# Patient Record
Sex: Female | Born: 1950
Health system: Southern US, Community
[De-identification: ages and names within clinical notes are randomized; demographics above are authoritative.]

## PROBLEM LIST (undated history)

## (undated) DIAGNOSIS — I10 Essential (primary) hypertension: Secondary | ICD-10-CM

## (undated) DIAGNOSIS — F419 Anxiety disorder, unspecified: Secondary | ICD-10-CM

## (undated) DIAGNOSIS — F329 Major depressive disorder, single episode, unspecified: Secondary | ICD-10-CM

## (undated) DIAGNOSIS — M8580 Other specified disorders of bone density and structure, unspecified site: Secondary | ICD-10-CM

## (undated) DIAGNOSIS — K635 Polyp of colon: Secondary | ICD-10-CM

## (undated) DIAGNOSIS — T8859XA Other complications of anesthesia, initial encounter: Secondary | ICD-10-CM

## (undated) DIAGNOSIS — J449 Chronic obstructive pulmonary disease, unspecified: Secondary | ICD-10-CM

## (undated) DIAGNOSIS — F32A Depression, unspecified: Secondary | ICD-10-CM

## (undated) HISTORY — PX: APPENDECTOMY: SHX54

## (undated) HISTORY — DX: Depression, unspecified: F32.A

## (undated) HISTORY — DX: Polyp of colon: K63.5

## (undated) HISTORY — DX: Essential (primary) hypertension: I10

## (undated) HISTORY — DX: Major depressive disorder, single episode, unspecified: F32.9

## (undated) HISTORY — DX: Other specified disorders of bone density and structure, unspecified site: M85.80

## (undated) HISTORY — DX: Chronic obstructive pulmonary disease, unspecified: J44.9

## (undated) HISTORY — DX: Anxiety disorder, unspecified: F41.9

---

## 1998-11-14 HISTORY — PX: COLONOSCOPY: SHX174

## 2001-05-03 ENCOUNTER — Ambulatory Visit (HOSPITAL_COMMUNITY): Admission: RE | Admit: 2001-05-03 | Discharge: 2001-05-03 | Payer: Self-pay | Admitting: *Deleted

## 2001-05-03 ENCOUNTER — Encounter: Payer: Self-pay | Admitting: *Deleted

## 2001-07-17 ENCOUNTER — Encounter: Payer: Self-pay | Admitting: Family Medicine

## 2001-07-17 ENCOUNTER — Ambulatory Visit (HOSPITAL_COMMUNITY): Admission: RE | Admit: 2001-07-17 | Discharge: 2001-07-17 | Payer: Self-pay | Admitting: Family Medicine

## 2001-07-18 ENCOUNTER — Encounter: Payer: Self-pay | Admitting: Family Medicine

## 2001-07-18 ENCOUNTER — Ambulatory Visit (HOSPITAL_COMMUNITY): Admission: RE | Admit: 2001-07-18 | Discharge: 2001-07-18 | Payer: Self-pay | Admitting: Family Medicine

## 2002-07-26 ENCOUNTER — Encounter: Payer: Self-pay | Admitting: Family Medicine

## 2002-07-26 ENCOUNTER — Ambulatory Visit (HOSPITAL_COMMUNITY): Admission: RE | Admit: 2002-07-26 | Discharge: 2002-07-26 | Payer: Self-pay | Admitting: Family Medicine

## 2003-09-30 ENCOUNTER — Ambulatory Visit (HOSPITAL_COMMUNITY): Admission: RE | Admit: 2003-09-30 | Discharge: 2003-09-30 | Payer: Self-pay | Admitting: Family Medicine

## 2003-10-02 ENCOUNTER — Ambulatory Visit (HOSPITAL_COMMUNITY): Admission: RE | Admit: 2003-10-02 | Discharge: 2003-10-02 | Payer: Self-pay | Admitting: Family Medicine

## 2003-10-20 ENCOUNTER — Observation Stay (HOSPITAL_COMMUNITY): Admission: EM | Admit: 2003-10-20 | Discharge: 2003-10-20 | Payer: Self-pay | Admitting: Emergency Medicine

## 2004-04-14 ENCOUNTER — Ambulatory Visit (HOSPITAL_COMMUNITY): Admission: RE | Admit: 2004-04-14 | Discharge: 2004-04-14 | Payer: Self-pay | Admitting: Family Medicine

## 2005-08-02 ENCOUNTER — Ambulatory Visit (HOSPITAL_COMMUNITY): Admission: RE | Admit: 2005-08-02 | Discharge: 2005-08-02 | Payer: Self-pay | Admitting: Family Medicine

## 2005-10-13 ENCOUNTER — Ambulatory Visit: Payer: Self-pay | Admitting: Internal Medicine

## 2005-11-14 HISTORY — PX: COLONOSCOPY: SHX174

## 2005-11-21 ENCOUNTER — Ambulatory Visit: Payer: Self-pay | Admitting: Internal Medicine

## 2005-11-21 ENCOUNTER — Ambulatory Visit (HOSPITAL_COMMUNITY): Admission: RE | Admit: 2005-11-21 | Discharge: 2005-11-21 | Payer: Self-pay | Admitting: Internal Medicine

## 2006-08-21 ENCOUNTER — Ambulatory Visit (HOSPITAL_COMMUNITY): Admission: RE | Admit: 2006-08-21 | Discharge: 2006-08-21 | Payer: Self-pay | Admitting: Family Medicine

## 2007-02-05 ENCOUNTER — Ambulatory Visit (HOSPITAL_COMMUNITY): Admission: RE | Admit: 2007-02-05 | Discharge: 2007-02-05 | Payer: Self-pay | Admitting: Family Medicine

## 2007-12-21 ENCOUNTER — Ambulatory Visit (HOSPITAL_COMMUNITY): Admission: RE | Admit: 2007-12-21 | Discharge: 2007-12-21 | Payer: Self-pay | Admitting: Family Medicine

## 2008-01-08 ENCOUNTER — Ambulatory Visit (HOSPITAL_COMMUNITY): Admission: RE | Admit: 2008-01-08 | Discharge: 2008-01-08 | Payer: Self-pay | Admitting: Family Medicine

## 2008-10-07 ENCOUNTER — Ambulatory Visit (HOSPITAL_COMMUNITY): Admission: RE | Admit: 2008-10-07 | Discharge: 2008-10-07 | Payer: Self-pay | Admitting: Family Medicine

## 2010-02-23 ENCOUNTER — Ambulatory Visit (HOSPITAL_COMMUNITY): Admission: RE | Admit: 2010-02-23 | Discharge: 2010-02-23 | Payer: Self-pay | Admitting: Family Medicine

## 2010-03-30 ENCOUNTER — Ambulatory Visit (HOSPITAL_COMMUNITY): Admission: RE | Admit: 2010-03-30 | Discharge: 2010-03-30 | Payer: Self-pay | Admitting: Family Medicine

## 2010-12-04 ENCOUNTER — Encounter: Payer: Self-pay | Admitting: Family Medicine

## 2010-12-05 ENCOUNTER — Encounter: Payer: Self-pay | Admitting: Family Medicine

## 2011-02-01 ENCOUNTER — Encounter: Payer: Self-pay | Admitting: Gastroenterology

## 2011-02-10 NOTE — Letter (Signed)
Summary: Colonoscopy Letter  [facility]  [address] [place], [place] [address]   Phone: [phone]  Fax: [phone]      February 01, 2011 MRN: [id number]   EMONEE WINKOWSKI [address] [place], [place]  [address]   Dear Ms. FUNCHES,   According to your medical record, it is time for you to schedule a Colonoscopy. The American Cancer Society recommends this procedure as a method to detect early colon cancer. Patients with a family history of colon cancer, or a personal history of colon polyps or inflammatory bowel disease are at increased risk.  This letter has been generated based on the recommendations made at the time of your procedure. If you feel that in your particular situation this may no longer apply, please contact our office.  Please call our office at [phone] to schedule this appointment or to update your records at your earliest convenience.  Thank you for cooperating with [place] to provide you with the very best care possible.   Sincerely,  Judie Petit T. Russella Dar, M.D.  [facility] [phone]

## 2011-03-11 ENCOUNTER — Other Ambulatory Visit (HOSPITAL_COMMUNITY): Payer: Self-pay | Admitting: Family Medicine

## 2011-03-11 DIAGNOSIS — Z139 Encounter for screening, unspecified: Secondary | ICD-10-CM

## 2011-03-15 ENCOUNTER — Ambulatory Visit (HOSPITAL_COMMUNITY)
Admission: RE | Admit: 2011-03-15 | Discharge: 2011-03-15 | Disposition: A | Discharge status: Home / Self Care | Payer: 59 | Source: Ambulatory Visit | Attending: Family Medicine | Admitting: Family Medicine

## 2011-03-15 DIAGNOSIS — Z1231 Encounter for screening mammogram for malignant neoplasm of breast: Secondary | ICD-10-CM | POA: Insufficient documentation

## 2011-03-15 DIAGNOSIS — Z139 Encounter for screening, unspecified: Secondary | ICD-10-CM

## 2011-03-28 ENCOUNTER — Encounter: Payer: Self-pay | Admitting: Internal Medicine

## 2011-04-01 NOTE — Op Note (Signed)
NAME:  Brittany Miranda, Brittany Miranda                          ACCOUNT NO.:  [id number]   MEDICAL RECORD NO.:  [id number]                   PATIENT TYPE:  INP   LOCATION:  A304                                 FACILITY:  [facility]   PHYSICIAN:  Dalia Heading, M.D.               DATE OF BIRTH:  1951/09/17   DATE OF PROCEDURE:  10/19/2003  DATE OF DISCHARGE:                                 OPERATIVE REPORT   PREOPERATIVE DIAGNOSIS:  Acute appendicitis.   POSTOPERATIVE DIAGNOSIS:  Acute appendicitis.   PROCEDURE:  Laparoscopic appendectomy.   SURGEON:  Dalia Heading, M.D.   ANESTHESIA:  General endotracheal.   INDICATIONS:  The patient is a 60 year old white female who presented to the  emergency room with worsening periumbilical and right lower quadrant  abdominal pain.  CT scan of the abdomen and pelvis was performed which  revealed acute appendicitis.  The patient now comes to the operating room  for laparoscopic appendectomy.  The risks and benefits of the procedure  including bleeding, infection, and the possibility of an open procedure were  fully explained to the patient, who gave informed consent.   DESCRIPTION OF PROCEDURE:  The patient was placed in the supine position.  After induction of general endotracheal anesthesia, the abdomen was prepped  and draped using the usual sterile technique with Betadine.  Surgical site  confirmation was performed.   While in Trendelenburg position, a supraumbilical incision was made.  A  Veress needle was then introduced into the abdominal cavity and confirmation  of placement was done using the saline drop test.  The abdomen was then  insufflated to 16 mmHg pressure.  An 11-mm trocar was introduced into the  abdominal cavity under direct visualization without difficulty.  An  additional 12-mm trocar was placed in the suprapubic region and 5-mm trocars  were placed in the left lower quadrant region.  The appendix was visualized  and noted to be acutely  inflamed.  There was no evidence of perforation.   The mesoappendix was divided using the harmonic scalpel.  A vascular Endo-  GIA was placed across the base of the appendix and fired.  The appendix was  removed using EndoCatch back without difficulty.  The staple line was  inspected and noted to be within normal limits. All air was then evacuated  from the abdominal cavity prior to removal of the trocars.   All wounds were irrigated with normal saline.  All wounds were injected with  0.25% Sensorcaine.  The supraumbilical fascia as well as suprapubic fascia  were reapproximated using an #0 Vicryl interrupted suture. All skin  incisions were closed using staples.  Betadine ointment and dry sterile  dressing were applied. All tape and needle counts were correct at the end of  the procedure.  The patient was extubated in the operating room and went  back to recovery room awake and stable condition.  COMPLICATIONS:  None.   SPECIMENS:  Appendix.   BLOOD LOSS:  Minimal.      ___________________________________________                                            Dalia Heading, M.D.   MAJ/MEDQ  D:  10/20/2003  T:  10/20/2003  Job:  [id number]

## 2011-04-01 NOTE — Consult Note (Signed)
NAME:  Brittany Miranda, Brittany Miranda                ACCOUNT NO.:  [id number]   MEDICAL RECORD NO.:  [id number]          PATIENT TYPE:  AMB   LOCATION:  DAY                           FACILITY:  [facility]   PHYSICIAN:  R. Roetta Sessions, M.D. DATE OF BIRTH:  [birth year]   DATE OF CONSULTATION:  10/13/2005  DATE OF DISCHARGE:                                   CONSULTATION   REASON FOR CONSULTATION:  Three Hemoccult positive stools.   HISTORY OF PRESENT ILLNESS:  This patient is a pleasant, 60 year old  Caucasian female who was sent over at the request of Dr. Regino Schultze and  associates to further evaluate Hemoccult positive stool.  Brittany Miranda denies  any fresh blood per rectum or melena.  She went for her routine pelvic/GYN  exam and had three cards to come back positive.  She has not had any  abdominal pain or upper GI tract symptoms such as odynophagia, dysphagia,  early satiety, reflux symptoms, or nausea or vomiting.  She has not lost any  weight.  She takes Fosamax for osteoporosis.   Her past GI history is significant for ongoing colonoscopy some 6 years ago  down with the [facility].  She was said to have had some small polyps  removed and was due to for a colonoscopy 5 years later. She was Hemoccult  negative one year ago.  There is no family history of colorectal neoplasia.   PAST MEDICAL HISTORY:  Significant for osteoporosis, history of colonic  polyps.   PAST SURGERY:  Status post appendectomy 1 year ago.   CURRENT MEDICATIONS:  1.  Fosamax once week.  2.  Prozac 20 mg daily.  3.  Prempro daily.  4.  Multivitamin daily.  5.  Fibercon 2 daily.  6.  Calcium 500 mg with vitamin D once daily.  7.  Ibuprofen p.r.n.   ALLERGIES:  No known drug allergies.   FAMILY HISTORY:  Mother and dad with renal failure at age 46.  No history of  chronic GI or liver illness.   SOCIAL HISTORY:  The patient is married and has one child.  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  She stopped smoking 10  years ago.  She rarely  consumes alcohol.   REVIEW OF SYSTEMS:  No chest pain, no dyspnea on exertion, no fever or  chills, no change in weight.   PHYSICAL EXAMINATION:  GENERAL:  Reveals a pleasant, 61 year old lady  resting comfortably.  VITAL SIGNS:  Weight 111.5, height 5 feet 5 inches.  Temperature 98, BP  108/76, pulse of 68.  SKIN:  Warm and dry.  No jaundice.  No cutaneous stigmata of chronic liver  disease.  HEENT:  No scleral icterus.  NECK:  JVD is not prominent.  CHEST:  Lungs are clear to auscultation.  BREASTS:  Exam is deferred.  ABDOMEN:  Nondistended, positive bowel sounds.  Soft, nontender, without  appreciable mass or organomegaly.  EXTREMITIES:  No edema.   ADMITTING IMPRESSION:  [name] is a pleasant, 60 year old lady  found to be Hemoccult positive on three returnable cards.  It is good to know  that she had a colonoscopy 6 years ago, but she did have some polyps removed  at that time.  She needs to have another colonoscopy.  I have discussed this  approach with Brittany Miranda, the potential risks, benefits, and alternatives  have been reviewed, questions answered.  She would like to have an exam just  after Christmas once she returns from the [place].  We will make further  recommendations once the colonoscopy has been performed.   I would like to thank Dr. Karleen Hampshire and Margarita Mail, PA-C for allowing  me to see this nice lady today.      Jonathon Bellows, M.D.  Electronically Signed     RMR/MEDQ  D:  10/13/2005  T:  10/13/2005  Job:  [id number]   cc:   Kirk Ruths, M.D.  Fax: [phone]

## 2011-04-01 NOTE — Op Note (Signed)
NAME:  Brittany Miranda, Brittany Miranda                ACCOUNT NO.:  [id number]   MEDICAL RECORD NO.:  [id number]          PATIENT TYPE:  AMB   LOCATION:  DAY                           FACILITY:  [facility]   PHYSICIAN:  R. Roetta Sessions, M.D. DATE OF BIRTH:  01/06/1951   DATE OF PROCEDURE:  11/21/2005  DATE OF DISCHARGE:                                 OPERATIVE REPORT   PROCEDURE:  Diagnostic colonoscopy.   INDICATIONS FOR PROCEDURE:  The patient is a 60 year old lady found to be  Hemoccult positive on three returnable cards. She had a colonoscopy six  years ago with some polyps removed. Clinically, she is not having any GI  symptoms. No bleeding, etc. Colonoscopy is now being done. This approach has  been discussed with the patient at length. Potential risks, benefits, and  alternatives have been reviewed and questions answered. She is agreeable.  Please see documentation in the medical record.   PROCEDURE NOTE:  O2 saturation, blood pressure, pulse, and respirations were  monitored throughout the entire procedure. Conscious sedation with Versed 4  mg IV and Demerol 75 mg IV in divided doses.   INSTRUMENT:  Olympus video chip system.   FINDINGS:  Digital rectal exam revealed no abnormalities.   ENDOSCOPIC FINDINGS:  Prep was good.   Rectum:  Examination of the rectal mucosa including retroflexed view of the  anal verge revealed no abnormalities.   Colon:  Colonic mucosa was surveyed from the rectosigmoid junction through  the left, transverse, and right colon to the area of the appendiceal  orifice, ileocecal valve, and cecum. These structures were well seen and  photographed for the record. From this level, the scope was slowly  withdrawn, and all previously mentioned mucosal surfaces were again seen.  The patient had sigmoid diverticula. The remainder of the colonic mucosa  appeared normal. The patient tolerated the procedure well and was reactive  to endoscopy.   IMPRESSION:  1.  Normal  rectum.  2.  Sigmoid diverticula. The remainder of colonic mucosa appeared normal.   RECOMMENDATIONS:  The patient has no GI symptoms. She does take Fosamax and  ibuprofen on a regular basis. Etiology of Hemoccult positive stool not  defined based on today's examination.   RECOMMENDATIONS:  We will go ahead and do a CBC and see where we stand.  Certainly, if she has clinical evidence of ongoing bleeding, a further GI  evaluation will be warranted.      Jonathon Bellows, M.D.  Electronically Signed     RMR/MEDQ  D:  11/21/2005  T:  11/21/2005  Job:  [id number]   cc:   Kirk Ruths, M.D.  Fax: [phone]

## 2011-04-05 ENCOUNTER — Encounter: Payer: Self-pay | Admitting: Gastroenterology

## 2011-04-05 ENCOUNTER — Ambulatory Visit (INDEPENDENT_AMBULATORY_CARE_PROVIDER_SITE_OTHER): Payer: 59 | Admitting: Gastroenterology

## 2011-04-05 VITALS — BP 143/93 | HR 76 | Temp 97.3°F | Ht 64.0 in | Wt 109.8 lb

## 2011-04-05 DIAGNOSIS — K635 Polyp of colon: Secondary | ICD-10-CM | POA: Insufficient documentation

## 2011-04-05 DIAGNOSIS — D126 Benign neoplasm of colon, unspecified: Secondary | ICD-10-CM

## 2011-04-05 NOTE — Progress Notes (Signed)
Primary Care Physician:  Colette Ribas, MD  Primary Gastroenterologist:  Roetta Sessions MD  Chief Complaint  Patient presents with  . Colonoscopy    colon polyps    HPI:  Brittany Miranda is a 60 y.o. female here to schedule surveillance colonoscopy. She has a history of colon polyps and states she was told she is after followups. Polyps were found at time of colonoscopy in 2000 by the [facility] in [place]. Her last colonoscopy was in 2007 by Dr. Jena Gauss. She had sigmoid diverticula but no polyps at that time. Denies any constipation as long she takes her fiber. She also eats high fiber diet. Denies any blood in the stool or melena. No abdominal pain, anorexia, vomiting, weight loss, heartburn.  Current Outpatient Prescriptions  Medication Sig Dispense Refill  . Alendronate Sodium (FOSAMAX PO) Take by mouth.        . ALPRAZolam (XANAX) 0.25 MG tablet Take 0.25 mg by mouth at bedtime as needed.        . calcium-vitamin D 250-100 MG-UNIT per tablet Take 1 tablet by mouth 2 (two) times daily.        Marland Kitchen doxycycline (PERIOSTAT) 20 MG tablet 2 (two) times daily.       Marland Kitchen FLUoxetine (PROZAC) 20 MG capsule Take 20 mg by mouth daily.        Marland Kitchen ibuprofen (ADVIL,MOTRIN) 200 MG tablet Take 200 mg by mouth every 6 (six) hours as needed.        . Multiple Vitamin (MULTIVITAMIN) capsule Take 1 capsule by mouth daily.        . polycarbophil (FIBERCON) 625 MG tablet Take 625 mg by mouth 2 (two) times daily.          Allergies as of 04/05/2011  . (No Known Allergies)    Past Medical History  Diagnosis Date  . Osteopenia   . Colon polyps   . Anxiety   . Depression     Past Surgical History  Procedure Date  . Appendectomy   . Colonoscopy 11/2005    Dr. Claudius Sis diverticula  . Colonoscopy 2000    [facility]-->per patient colon polyps, adenomatous    Family History  Problem Relation Age of Onset  . Kidney disease Mother   . Kidney disease Father   . Liver disease Neg Hx   .  Colon polyps Neg Hx     History   Social History  . Marital Status: Divorced    Spouse Name: N/A    Number of Children: 1  . Years of Education: N/A   Occupational History  . retired     Agricultural consultant Music therapist) at [employer]   Social History Main Topics  . Smoking status: Former Smoker    Quit date: 04/04/1998  . Smokeless tobacco: Not on file  . Alcohol Use: No     socially  . Drug Use: No  . Sexually Active: Not on file     ROS:  General: Negative for anorexia, weight loss, fever, chills, fatigue, weakness. Eyes: Negative for vision changes.  ENT: Negative for hoarseness, difficulty swallowing , nasal congestion. CV: Negative for chest pain, angina, palpitations, dyspnea on exertion, peripheral edema.  Respiratory: Negative for dyspnea at rest, dyspnea on exertion, cough, sputum, wheezing.  GI: See history of present illness. GU:  Negative for dysuria, hematuria, urinary incontinence, urinary frequency, nocturnal urination.  MS: Negative for joint pain, low back pain.  Derm: Negative for rash or itching.  Neuro: Negative for weakness, abnormal  sensation, seizure, frequent headaches, memory loss, confusion.  Psych: Negative for anxiety, depression, suicidal ideation, hallucinations.  Endo: Negative for unusual weight change.  Heme: Negative for bruising or bleeding. Allergy: Negative for rash or hives.    Physical Examination:  BP 143/93  Pulse 76  Temp(Src) 97.3 F (36.3 C) (Temporal)  Ht 5\' 4"  (1.626 m)  Wt 109 lb 12.8 oz (49.805 kg)  BMI 18.85 kg/m2   General: Well-nourished, well-developed in no acute distress.  Head: Normocephalic, atraumatic.   Eyes: Conjunctiva pink, no icterus. Mouth: Oropharyngeal mucosa moist and pink , no lesions erythema or exudate. Neck: Supple without thyromegaly, masses, or lymphadenopathy.  Lungs: Clear to auscultation bilaterally.  Heart: Regular rate and rhythm, no murmurs rubs or gallops.  Abdomen: Bowel sounds are normal,  nontender, nondistended, no hepatosplenomegaly or masses, no abdominal bruits or    hernia , no rebound or guarding.   Extremities: No lower extremity edema.  Neuro: Alert and oriented x 4 , grossly normal neurologically.  Skin: Warm and dry, no rash or jaundice.   Psych: Alert and cooperative, normal mood and affect.

## 2011-04-05 NOTE — Assessment & Plan Note (Signed)
History of colonic polyps back in 2000. No polyp seen on colonoscopy in 2007. Proceed with followup colonoscopy at this time. I have discussed the risks, alternatives, benefits with regards to but not limited to the risk of reaction to medication, bleeding, infection, perforation and the patient is agreeable to proceed. Written consent to be obtained.

## 2011-04-05 NOTE — Progress Notes (Signed)
Cc to PCP 

## 2011-04-07 ENCOUNTER — Ambulatory Visit: Payer: 59 | Admitting: Urgent Care

## 2011-04-12 ENCOUNTER — Ambulatory Visit (HOSPITAL_COMMUNITY)
Admission: RE | Admit: 2011-04-12 | Discharge: 2011-04-12 | Disposition: A | Discharge status: Home / Self Care | Payer: 59 | Source: Ambulatory Visit | Attending: Internal Medicine | Admitting: Internal Medicine

## 2011-04-12 ENCOUNTER — Encounter: Payer: 59 | Admitting: Internal Medicine

## 2011-04-12 DIAGNOSIS — Z09 Encounter for follow-up examination after completed treatment for conditions other than malignant neoplasm: Secondary | ICD-10-CM | POA: Insufficient documentation

## 2011-04-12 DIAGNOSIS — K5730 Diverticulosis of large intestine without perforation or abscess without bleeding: Secondary | ICD-10-CM

## 2011-04-12 DIAGNOSIS — Z860100 Personal history of colon polyps, unspecified: Secondary | ICD-10-CM | POA: Insufficient documentation

## 2011-05-30 NOTE — Op Note (Signed)
  NAME:  Brittany Miranda, Brittany Miranda                ACCOUNT NO.:  [id number]  MEDICAL RECORD NO.:  [id number]           PATIENT TYPE:  O  LOCATION:  DAYP                          FACILITY:  [facility]  PHYSICIAN:  R. Roetta Sessions, M.D. DATE OF BIRTH:  07/08/1951  DATE OF PROCEDURE:  04/12/2011 DATE OF DISCHARGE:                              OPERATIVE REPORT   PROCEDURE:  Surveillance colonoscopy.  SURGEON:  R. Roetta Sessions, MD  INDICATIONS FOR PROCEDURE:  This is a 60 year old lady with history of colonic adenomas removed in the distant past (at [facility]).  Last colonoscopy in 2007 here demonstrated no polyps.  She has no lower GI tract symptoms currently.  Colonoscopy is now being done in a surveillance maneuver.  Risks, benefits, limitations, alternatives, and imponderables have been discussed, questions answered.  Please see the documentation in the medical record.  PROCEDURE NOTE:  O2 saturation, blood pressure, pulse, and respirations were monitored throughout the entire procedure.  CONSCIOUS SEDATION:  Versed 8 mg IV and Demerol 100 mg IV in divided doses.  INSTRUMENT:  Pentax video chip system.  FINDINGS:  Digital rectal exam revealed no abnormalities.  Endoscopic findings:  Prep was good.  Colon:  Colonic mucosa was surveyed from the rectosigmoid junction through the left transverse, right colon to the appendiceal orifice, ileocecal valve/cecum.  These structures were well seen and photographed for record.  From this level, the scope was slowly and cautiously withdrawn.  All previous mentioned mucosal surfaces were again seen. The patient was noted to have a left-sided transverse diverticulum, however, the remainder of the colonic mucosa appeared normal.  Scope was pulled down the rectum.  A thorough examination of the rectal mucosa including retroflexed view of the anal verge demonstrated no abnormalities.  The patient tolerated the procedure well.  Cecal withdrawal time 7  minutes.  IMPRESSION: 1. Normal rectum. 2. Left-sided transverse diverticulum and colonic mucosa appeared     normal.  RECOMMENDATIONS: 1. Diverticulosis literature provided to Ms. Franson. 2. Repeat colonoscopy in 5 years.     Jonathon Bellows, M.D.     RMR/MEDQ  D:  04/12/2011  T:  04/12/2011  Job:  [id number]  cc:   Corrie Mckusick, M.D. Fax: [phone]  Electronically Signed by Lorrin Goodell M.D. on 05/30/2011 08:35:13 AM

## 2011-08-24 ENCOUNTER — Other Ambulatory Visit (HOSPITAL_COMMUNITY): Payer: Self-pay | Admitting: Family Medicine

## 2011-08-24 DIAGNOSIS — Z139 Encounter for screening, unspecified: Secondary | ICD-10-CM

## 2012-04-02 ENCOUNTER — Ambulatory Visit (HOSPITAL_COMMUNITY)
Admission: RE | Admit: 2012-04-02 | Discharge: 2012-04-02 | Disposition: A | Discharge status: Home / Self Care | Payer: BC Managed Care – PPO | Source: Ambulatory Visit | Attending: Family Medicine | Admitting: Family Medicine

## 2012-04-02 DIAGNOSIS — Z13820 Encounter for screening for osteoporosis: Secondary | ICD-10-CM | POA: Insufficient documentation

## 2012-04-02 DIAGNOSIS — Z139 Encounter for screening, unspecified: Secondary | ICD-10-CM

## 2012-04-02 DIAGNOSIS — M899 Disorder of bone, unspecified: Secondary | ICD-10-CM | POA: Insufficient documentation

## 2012-04-02 DIAGNOSIS — M949 Disorder of cartilage, unspecified: Secondary | ICD-10-CM | POA: Insufficient documentation

## 2012-04-05 ENCOUNTER — Other Ambulatory Visit (HOSPITAL_COMMUNITY): Payer: Self-pay | Admitting: Family Medicine

## 2012-04-05 DIAGNOSIS — IMO0001 Reserved for inherently not codable concepts without codable children: Secondary | ICD-10-CM

## 2012-04-13 ENCOUNTER — Ambulatory Visit (HOSPITAL_COMMUNITY)
Admission: RE | Admit: 2012-04-13 | Discharge: 2012-04-13 | Disposition: A | Discharge status: Home / Self Care | Payer: BC Managed Care – PPO | Source: Ambulatory Visit | Attending: Family Medicine | Admitting: Family Medicine

## 2012-04-13 DIAGNOSIS — Z1231 Encounter for screening mammogram for malignant neoplasm of breast: Secondary | ICD-10-CM | POA: Insufficient documentation

## 2012-04-13 DIAGNOSIS — IMO0001 Reserved for inherently not codable concepts without codable children: Secondary | ICD-10-CM

## 2012-10-18 ENCOUNTER — Other Ambulatory Visit (HOSPITAL_COMMUNITY): Payer: Self-pay | Admitting: Family Medicine

## 2012-10-18 ENCOUNTER — Ambulatory Visit (HOSPITAL_COMMUNITY)
Admission: RE | Admit: 2012-10-18 | Discharge: 2012-10-18 | Disposition: A | Discharge status: Home / Self Care | Payer: BC Managed Care – PPO | Source: Ambulatory Visit | Attending: Family Medicine | Admitting: Family Medicine

## 2012-10-18 DIAGNOSIS — R059 Cough, unspecified: Secondary | ICD-10-CM

## 2012-10-18 DIAGNOSIS — R0989 Other specified symptoms and signs involving the circulatory and respiratory systems: Secondary | ICD-10-CM | POA: Insufficient documentation

## 2012-10-18 DIAGNOSIS — R05 Cough: Secondary | ICD-10-CM

## 2012-10-18 DIAGNOSIS — R918 Other nonspecific abnormal finding of lung field: Secondary | ICD-10-CM | POA: Insufficient documentation

## 2012-10-18 DIAGNOSIS — J4489 Other specified chronic obstructive pulmonary disease: Secondary | ICD-10-CM | POA: Insufficient documentation

## 2012-10-18 DIAGNOSIS — J449 Chronic obstructive pulmonary disease, unspecified: Secondary | ICD-10-CM | POA: Insufficient documentation

## 2012-10-19 ENCOUNTER — Other Ambulatory Visit (HOSPITAL_COMMUNITY): Payer: Self-pay | Admitting: Internal Medicine

## 2012-10-19 ENCOUNTER — Ambulatory Visit (HOSPITAL_COMMUNITY)
Admission: RE | Admit: 2012-10-19 | Discharge: 2012-10-19 | Disposition: A | Discharge status: Home / Self Care | Payer: BC Managed Care – PPO | Source: Ambulatory Visit | Attending: Internal Medicine | Admitting: Internal Medicine

## 2012-10-19 DIAGNOSIS — R9389 Abnormal findings on diagnostic imaging of other specified body structures: Secondary | ICD-10-CM

## 2012-10-19 DIAGNOSIS — R918 Other nonspecific abnormal finding of lung field: Secondary | ICD-10-CM | POA: Insufficient documentation

## 2013-05-31 ENCOUNTER — Other Ambulatory Visit (HOSPITAL_COMMUNITY): Payer: Self-pay | Admitting: Family Medicine

## 2013-05-31 DIAGNOSIS — Z139 Encounter for screening, unspecified: Secondary | ICD-10-CM

## 2013-06-06 ENCOUNTER — Ambulatory Visit (HOSPITAL_COMMUNITY)
Admission: RE | Admit: 2013-06-06 | Discharge: 2013-06-06 | Disposition: A | Discharge status: Home / Self Care | Payer: BC Managed Care – PPO | Source: Ambulatory Visit | Attending: Family Medicine | Admitting: Family Medicine

## 2013-06-06 DIAGNOSIS — Z1231 Encounter for screening mammogram for malignant neoplasm of breast: Secondary | ICD-10-CM | POA: Insufficient documentation

## 2013-06-06 DIAGNOSIS — Z139 Encounter for screening, unspecified: Secondary | ICD-10-CM

## 2014-06-09 ENCOUNTER — Other Ambulatory Visit (HOSPITAL_COMMUNITY): Payer: Self-pay | Admitting: Family Medicine

## 2014-06-09 DIAGNOSIS — Z1231 Encounter for screening mammogram for malignant neoplasm of breast: Secondary | ICD-10-CM

## 2014-06-11 ENCOUNTER — Encounter: Payer: Self-pay | Admitting: Gastroenterology

## 2014-06-11 ENCOUNTER — Ambulatory Visit (HOSPITAL_COMMUNITY)
Admission: RE | Admit: 2014-06-11 | Discharge: 2014-06-11 | Disposition: A | Discharge status: Home / Self Care | Payer: BC Managed Care – PPO | Source: Ambulatory Visit | Attending: Family Medicine | Admitting: Family Medicine

## 2014-06-11 DIAGNOSIS — Z1231 Encounter for screening mammogram for malignant neoplasm of breast: Secondary | ICD-10-CM

## 2015-05-14 ENCOUNTER — Other Ambulatory Visit (HOSPITAL_COMMUNITY): Payer: Self-pay | Admitting: Family Medicine

## 2015-05-14 DIAGNOSIS — Z1389 Encounter for screening for other disorder: Secondary | ICD-10-CM

## 2015-05-20 ENCOUNTER — Other Ambulatory Visit (HOSPITAL_COMMUNITY): Payer: Self-pay

## 2015-05-21 ENCOUNTER — Ambulatory Visit (HOSPITAL_COMMUNITY)
Admission: RE | Admit: 2015-05-21 | Discharge: 2015-05-21 | Disposition: A | Discharge status: Home / Self Care | Payer: Medicare Other | Source: Ambulatory Visit | Attending: Family Medicine | Admitting: Family Medicine

## 2015-05-21 DIAGNOSIS — Z1389 Encounter for screening for other disorder: Secondary | ICD-10-CM

## 2015-05-21 DIAGNOSIS — M8580 Other specified disorders of bone density and structure, unspecified site: Secondary | ICD-10-CM | POA: Insufficient documentation

## 2015-05-21 DIAGNOSIS — Z780 Asymptomatic menopausal state: Secondary | ICD-10-CM | POA: Diagnosis not present

## 2015-06-26 ENCOUNTER — Other Ambulatory Visit (HOSPITAL_COMMUNITY): Payer: Self-pay | Admitting: Family Medicine

## 2015-06-26 DIAGNOSIS — Z1231 Encounter for screening mammogram for malignant neoplasm of breast: Secondary | ICD-10-CM

## 2015-07-03 ENCOUNTER — Ambulatory Visit (HOSPITAL_COMMUNITY)
Admission: RE | Admit: 2015-07-03 | Discharge: 2015-07-03 | Disposition: A | Discharge status: Home / Self Care | Payer: Medicare Other | Source: Ambulatory Visit | Attending: Family Medicine | Admitting: Family Medicine

## 2015-07-03 DIAGNOSIS — Z1231 Encounter for screening mammogram for malignant neoplasm of breast: Secondary | ICD-10-CM | POA: Insufficient documentation

## 2016-01-14 DIAGNOSIS — Z1389 Encounter for screening for other disorder: Secondary | ICD-10-CM | POA: Diagnosis not present

## 2016-01-14 DIAGNOSIS — R05 Cough: Secondary | ICD-10-CM | POA: Diagnosis not present

## 2016-01-14 DIAGNOSIS — Z681 Body mass index (BMI) 19 or less, adult: Secondary | ICD-10-CM | POA: Diagnosis not present

## 2016-01-14 DIAGNOSIS — J449 Chronic obstructive pulmonary disease, unspecified: Secondary | ICD-10-CM | POA: Diagnosis not present

## 2016-01-21 ENCOUNTER — Other Ambulatory Visit (HOSPITAL_COMMUNITY): Payer: Self-pay | Admitting: Family Medicine

## 2016-01-21 ENCOUNTER — Ambulatory Visit (HOSPITAL_COMMUNITY)
Admission: RE | Admit: 2016-01-21 | Discharge: 2016-01-21 | Disposition: A | Discharge status: Home / Self Care | Payer: Medicare HMO | Source: Ambulatory Visit | Attending: Family Medicine | Admitting: Family Medicine

## 2016-01-21 DIAGNOSIS — J209 Acute bronchitis, unspecified: Secondary | ICD-10-CM

## 2016-01-21 DIAGNOSIS — J45909 Unspecified asthma, uncomplicated: Secondary | ICD-10-CM | POA: Insufficient documentation

## 2016-01-21 DIAGNOSIS — J449 Chronic obstructive pulmonary disease, unspecified: Secondary | ICD-10-CM | POA: Diagnosis not present

## 2016-01-21 DIAGNOSIS — Z681 Body mass index (BMI) 19 or less, adult: Secondary | ICD-10-CM | POA: Diagnosis not present

## 2016-01-21 DIAGNOSIS — Z1389 Encounter for screening for other disorder: Secondary | ICD-10-CM | POA: Diagnosis not present

## 2016-01-21 DIAGNOSIS — R05 Cough: Secondary | ICD-10-CM | POA: Diagnosis not present

## 2016-01-21 DIAGNOSIS — R69 Illness, unspecified: Secondary | ICD-10-CM | POA: Diagnosis not present

## 2016-02-25 DIAGNOSIS — Z681 Body mass index (BMI) 19 or less, adult: Secondary | ICD-10-CM | POA: Diagnosis not present

## 2016-02-25 DIAGNOSIS — Z1389 Encounter for screening for other disorder: Secondary | ICD-10-CM | POA: Diagnosis not present

## 2016-02-25 DIAGNOSIS — M7521 Bicipital tendinitis, right shoulder: Secondary | ICD-10-CM | POA: Diagnosis not present

## 2016-03-31 DIAGNOSIS — M25511 Pain in right shoulder: Secondary | ICD-10-CM | POA: Diagnosis not present

## 2016-03-31 DIAGNOSIS — Z681 Body mass index (BMI) 19 or less, adult: Secondary | ICD-10-CM | POA: Diagnosis not present

## 2016-03-31 DIAGNOSIS — Z1389 Encounter for screening for other disorder: Secondary | ICD-10-CM | POA: Diagnosis not present

## 2016-04-12 DIAGNOSIS — M19011 Primary osteoarthritis, right shoulder: Secondary | ICD-10-CM | POA: Diagnosis not present

## 2016-05-26 DIAGNOSIS — Z681 Body mass index (BMI) 19 or less, adult: Secondary | ICD-10-CM | POA: Diagnosis not present

## 2016-05-26 DIAGNOSIS — E559 Vitamin D deficiency, unspecified: Secondary | ICD-10-CM | POA: Diagnosis not present

## 2016-05-26 DIAGNOSIS — Z1389 Encounter for screening for other disorder: Secondary | ICD-10-CM | POA: Diagnosis not present

## 2016-05-26 DIAGNOSIS — R69 Illness, unspecified: Secondary | ICD-10-CM | POA: Diagnosis not present

## 2016-05-26 DIAGNOSIS — Z0000 Encounter for general adult medical examination without abnormal findings: Secondary | ICD-10-CM | POA: Diagnosis not present

## 2016-06-22 ENCOUNTER — Ambulatory Visit (INDEPENDENT_AMBULATORY_CARE_PROVIDER_SITE_OTHER): Payer: Medicare HMO | Admitting: Gastroenterology

## 2016-06-22 ENCOUNTER — Other Ambulatory Visit: Payer: Self-pay

## 2016-06-22 ENCOUNTER — Encounter: Payer: Self-pay | Admitting: Gastroenterology

## 2016-06-22 VITALS — BP 139/90 | HR 78 | Temp 97.0°F | Ht 64.0 in | Wt 101.0 lb

## 2016-06-22 DIAGNOSIS — K635 Polyp of colon: Secondary | ICD-10-CM

## 2016-06-22 DIAGNOSIS — Z86010 Personal history of colonic polyps: Secondary | ICD-10-CM

## 2016-06-22 NOTE — Progress Notes (Signed)
Primary Care Physician:  Purvis Kilts, MD Primary Gastroenterologist:  Dr. Gala Romney   Chief Complaint  Patient presents with  . Colonoscopy    5 year    HPI:   Brittany Miranda is a 65 y.o. female presenting today to schedule surveillance colonoscopy due to history of colonic adenomas in the remote past. Her last colonoscopy in 2012 by Dr. Gala Romney was without polyps.   She has no concerning lower or upper GI symptoms. No abdominal pain, N/V, rectal bleeding, changes in appetite, weight loss, dysphagia. 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     Past Medical History:  Diagnosis Date  . Anxiety   . Colon polyps    adenomas in remote past   . COPD (chronic obstructive pulmonary disease) ([place])   . Depression   . Osteopenia     Past Surgical History:  Procedure Laterality Date  . APPENDECTOMY    . COLONOSCOPY  11/2005   Dr. Ala Bent diverticula  . COLONOSCOPY  2000   [facility]-->per patient colon polyps, adenomatous    Current Outpatient Prescriptions  Medication Sig Dispense Refill  . ALPRAZolam (XANAX) 0.25 MG tablet Take 0.5 mg by mouth 3 (three) times daily as needed.     . Calcium Carbonate-Vitamin D (CALTRATE 600+D PO) Take 600 mg by mouth daily.    . diphenhydrAMINE (BENADRYL) 25 mg capsule Take 25 mg by mouth at bedtime as needed.    Marland Kitchen FLUoxetine (PROZAC) 20 MG capsule Take 20 mg by mouth daily.      Marland Kitchen ibuprofen (ADVIL,MOTRIN) 200 MG tablet Take 200 mg by mouth every 6 (six) hours as needed.      . Multiple Vitamin (MULTIVITAMIN) capsule Take 1 capsule by mouth daily.      . Omega-3 Fatty Acids (FISH OIL) 1000 MG CAPS Take 1,000 mg by mouth daily.    . polycarbophil (FIBERCON) 625 MG tablet Take 1,250 mg by mouth daily.      No current facility-administered medications for this visit.     Allergies as of 06/22/2016  . (No Known Allergies)      Family History  Problem Relation Age of Onset  . Kidney disease Mother   . Kidney disease Father   . Liver disease Neg Hx   . Colon cancer Neg Hx     Social History   Social History  . Marital status: Divorced    Spouse name: N/A  . Number of children: 1  . Years of education: N/A   Occupational History  . retired Retired    Psychologist, occupational Loss adjuster, chartered) at [employer]  . Smoking status: Former Smoker    Quit date: 04/04/1998  . Smokeless tobacco: Not on file  . Alcohol use No     Comment: socially  . Drug use: No  . Sexual activity: Not on file   Other Topics Concern  . Not on file   Social History Narrative  . No narrative on file    Review of Systems: Gen: Denies any fever, chills, fatigue, weight loss, lack of appetite.  CV: Denies chest pain, heart palpitations, peripheral edema, syncope.  Resp: Denies shortness of breath at rest or with exertion. Denies wheezing or cough.  GI: see HPI  GU : Denies urinary burning, urinary frequency, urinary hesitancy MS: +arthritis  Derm: Denies rash, itching,  dry skin Psych: Denies depression, anxiety, memory loss, and confusion Heme: Denies bruising, bleeding, and enlarged lymph nodes.  Physical Exam: BP 139/90   Pulse 78   Temp 97 F (36.1 C) (Oral)   Ht 5\' 4"  (1.626 m)   Wt 101 lb (45.8 kg)   BMI 17.34 kg/m  General:   Alert and oriented. Pleasant and cooperative. Well-nourished and well-developed.  Head:  Normocephalic and atraumatic. Eyes:  Without icterus, sclera clear and conjunctiva pink.  Ears:  Normal auditory acuity. Nose:  No deformity, discharge,  or lesions. Lungs:  Clear to auscultation bilaterally. No wheezes, rales, or rhonchi. No distress.  Heart:  S1, S2 present without murmurs appreciated.  Abdomen:  +BS, soft, non-tender and non-distended. No HSM noted. No guarding or rebound. No masses appreciated.  Rectal:  Deferred  Msk:  Symmetrical without gross deformities. Normal  posture. Extremities:  Without edema. Neurologic:  Alert and  oriented x4 Psych:  Alert and cooperative. Normal mood and affect.

## 2016-06-22 NOTE — Assessment & Plan Note (Signed)
65 year old female with history of remote colonic adenomas and last in 2012 without polyps, now due for surveillance. No FH of colon cancer and no concerning lower GI or upper GI symptoms of note.   Proceed with TCS with Dr. Gala Romney in near future: the risks, benefits, and alternatives have been discussed with the patient in detail. The patient states understanding and desires to proceed. Phenergan 12.5 mg IV on call due to Prozac once daily and occasional Xanax use

## 2016-06-22 NOTE — Patient Instructions (Signed)
We have scheduled you for a colonoscopy in the near future with Dr. Gala Romney.  Further recommendations to follow.  Enjoy the tomato sandwiches!

## 2016-06-22 NOTE — Progress Notes (Signed)
cc'ed to pcp °

## 2016-07-08 ENCOUNTER — Encounter (HOSPITAL_COMMUNITY): Payer: Self-pay | Admitting: *Deleted

## 2016-07-08 ENCOUNTER — Encounter (HOSPITAL_COMMUNITY)
Admission: RE | Disposition: A | Discharge status: Home / Self Care | Payer: Self-pay | Source: Ambulatory Visit | Attending: Internal Medicine

## 2016-07-08 ENCOUNTER — Ambulatory Visit (HOSPITAL_COMMUNITY)
Admission: RE | Admit: 2016-07-08 | Discharge: 2016-07-08 | Disposition: A | Discharge status: Home / Self Care | Payer: Medicare HMO | Source: Ambulatory Visit | Attending: Internal Medicine | Admitting: Internal Medicine

## 2016-07-08 DIAGNOSIS — Z87891 Personal history of nicotine dependence: Secondary | ICD-10-CM | POA: Diagnosis not present

## 2016-07-08 DIAGNOSIS — Z86010 Personal history of colonic polyps: Secondary | ICD-10-CM | POA: Diagnosis not present

## 2016-07-08 DIAGNOSIS — F329 Major depressive disorder, single episode, unspecified: Secondary | ICD-10-CM | POA: Diagnosis not present

## 2016-07-08 DIAGNOSIS — Z79899 Other long term (current) drug therapy: Secondary | ICD-10-CM | POA: Diagnosis not present

## 2016-07-08 DIAGNOSIS — Z1211 Encounter for screening for malignant neoplasm of colon: Secondary | ICD-10-CM | POA: Insufficient documentation

## 2016-07-08 DIAGNOSIS — F419 Anxiety disorder, unspecified: Secondary | ICD-10-CM | POA: Diagnosis not present

## 2016-07-08 DIAGNOSIS — K5730 Diverticulosis of large intestine without perforation or abscess without bleeding: Secondary | ICD-10-CM | POA: Insufficient documentation

## 2016-07-08 DIAGNOSIS — D123 Benign neoplasm of transverse colon: Secondary | ICD-10-CM | POA: Diagnosis not present

## 2016-07-08 DIAGNOSIS — R69 Illness, unspecified: Secondary | ICD-10-CM | POA: Diagnosis not present

## 2016-07-08 DIAGNOSIS — J449 Chronic obstructive pulmonary disease, unspecified: Secondary | ICD-10-CM | POA: Diagnosis not present

## 2016-07-08 HISTORY — PX: COLONOSCOPY: SHX5424

## 2016-07-08 SURGERY — COLONOSCOPY
Anesthesia: Moderate Sedation

## 2016-07-08 MED ORDER — MIDAZOLAM HCL 5 MG/5ML IJ SOLN
INTRAMUSCULAR | Status: DC | PRN
  Administered 2016-07-08: 1 mg via INTRAVENOUS
  Administered 2016-07-08 (×2): 2 mg via INTRAVENOUS

## 2016-07-08 MED ORDER — MEPERIDINE HCL 100 MG/ML IJ SOLN
INTRAMUSCULAR | Status: AC
  Filled 2016-07-08: qty 2

## 2016-07-08 MED ORDER — MIDAZOLAM HCL 5 MG/5ML IJ SOLN
INTRAMUSCULAR | Status: DC
Start: 2016-07-08 — End: 2016-07-08
  Filled 2016-07-08: qty 10

## 2016-07-08 MED ORDER — PROMETHAZINE HCL 25 MG/ML IJ SOLN
12.5000 mg | Freq: Once | INTRAMUSCULAR | Status: AC
  Administered 2016-07-08: 12.5 mg via INTRAVENOUS

## 2016-07-08 MED ORDER — STERILE WATER FOR IRRIGATION IR SOLN
Status: DC | PRN
  Administered 2016-07-08: 2.5 mL

## 2016-07-08 MED ORDER — ONDANSETRON HCL 4 MG/2ML IJ SOLN
INTRAMUSCULAR | Status: DC | PRN
  Administered 2016-07-08: 4 mg via INTRAVENOUS

## 2016-07-08 MED ORDER — ONDANSETRON HCL 4 MG/2ML IJ SOLN
INTRAMUSCULAR | Status: AC
  Filled 2016-07-08: qty 2

## 2016-07-08 MED ORDER — MEPERIDINE HCL 100 MG/ML IJ SOLN
INTRAMUSCULAR | Status: DC | PRN
  Administered 2016-07-08: 25 mg via INTRAVENOUS
  Administered 2016-07-08 (×2): 50 mg via INTRAVENOUS

## 2016-07-08 MED ORDER — PROMETHAZINE HCL 25 MG/ML IJ SOLN
INTRAMUSCULAR | Status: AC
  Filled 2016-07-08: qty 1

## 2016-07-08 MED ORDER — SODIUM CHLORIDE 0.9 % IV SOLN
INTRAVENOUS | Status: DC
  Administered 2016-07-08: 15:00:00 via INTRAVENOUS

## 2016-07-08 MED ORDER — SODIUM CHLORIDE 0.9% FLUSH
INTRAVENOUS | Status: AC
  Filled 2016-07-08: qty 10

## 2016-07-08 NOTE — Op Note (Signed)
[facility] Patient Name: Brittany Miranda Procedure Date: 07/08/2016 3:28 PM MRN: [id number] Date of Birth: December 04, 1950 Attending MD: Norvel Richards , MD CSN: [id number] Age: 65 Admit Type: Inpatient Procedure:                Colonoscopy with snare polypectomy Indications:              High risk colon cancer surveillance: Personal                            history of colonic polyps Providers:                Norvel Richards, MD, Lurline Del, RN, Rosina Lowenstein, RN Referring MD:              Medicines:                Midazolam 6 mg IV, Meperidine 125 mg IV,                            Ondansetron 4 mg IV, Promethazine [id number] mg IV Complications:            No immediate complications. Estimated Blood Loss:     Estimated blood loss was minimal. Procedure:                Pre-Anesthesia Assessment:                           - Prior to the procedure, a History and Physical                            was performed, and patient medications and                            allergies were reviewed. The patient's tolerance of                            previous anesthesia was also reviewed. The risks                            and benefits of the procedure and the sedation                            options and risks were discussed with the patient.                            All questions were answered, and informed consent                            was obtained. Prior Anticoagulants: The patient has                            taken no previous anticoagulant or antiplatelet  agents. ASA Grade Assessment: II - A patient with                            mild systemic disease. After reviewing the risks                            and benefits, the patient was deemed in                            satisfactory condition to undergo the procedure.                           After obtaining informed consent, the colonoscope      was passed under direct vision. Throughout the                            procedure, the patient's blood pressure, pulse, and                            oxygen saturations were monitored continuously. The                            [id number] scope was introduced through                            the anus and advanced to the the cecum, identified                            by appendiceal orifice and ileocecal valve. The                            colonoscopy was performed without difficulty. The                            patient tolerated the procedure well. The quality                            of the bowel preparation was adequate. The                            ileocecal valve, appendiceal orifice, and rectum                            were photographed. Scope In: 3:48:13 PM Scope Out: 4:05:11 PM Scope Withdrawal Time: 0 hours 9 minutes 2 seconds  Total Procedure Duration: 0 hours 16 minutes 58 seconds  Findings:      The perianal and digital rectal examinations were normal.      A 5 mm polyp was found in the splenic flexure. The polyp was       semi-pedunculated. The polyp was removed with a cold snare. Resection       and retrieval were complete. Estimated blood loss was minimal. The       remainder of the colonic mucosa appeared normal aside from transverse  and left-sided diverticula. Impression:               - One 5 mm polyp at the splenic flexure, removed                            with a cold snare. Resected and retrieved. Colonic                            diverticulosis Moderate Sedation:      Moderate (conscious) sedation was administered by the endoscopy nurse       and supervised by the endoscopist. The following parameters were       monitored: oxygen saturation, heart rate, blood pressure, respiratory       rate, EKG, adequacy of pulmonary ventilation, and response to care.       Total physician intraservice time was 23 minutes. Recommendation:            - Repeat colonoscopy date to be determined after                            pending pathology results are reviewed for                            surveillance based on pathology results.                           - Patient has a contact number available for                            emergencies. The signs and symptoms of potential                            delayed complications were discussed with the                            patient. Return to normal activities tomorrow.                            Written discharge instructions were provided to the                            patient.                           - Advance diet as tolerated.                           - Continue present medications.                           - Repeat colonoscopy date to be determined after                            pending pathology results are reviewed for  surveillance based on pathology results.                           - Return to GI office (date not yet determined). Procedure Code(s):        --- Professional ---                           [phone], Colonoscopy, flexible; with removal of                            tumor(s), polyp(s), or other lesion(s) by snare                            technique                           99152, Moderate sedation services provided by the                            same physician or other qualified health care                            professional performing the diagnostic or                            therapeutic service that the sedation supports,                            requiring the presence of an independent trained                            observer to assist in the monitoring of the                            patient's level of consciousness and physiological                            status; initial 15 minutes of intraservice time,                            patient age 42 years or older                           [phone],  Moderate sedation services; each additional                            15 minutes intraservice time Diagnosis Code(s):        --- Professional ---                           Z86.010, Personal history of colonic polyps                           D12.3, Benign neoplasm of transverse colon (hepatic  flexure or splenic flexure) CPT copyright 2016 American Medical Association. All rights reserved. The codes documented in this report are preliminary and upon coder review may  be revised to meet current compliance requirements. Cristopher Estimable. Brittany Sellin, MD Norvel Richards, MD 07/08/2016 4:19:36 PM This report has been signed electronically. Number of Addenda: 0

## 2016-07-08 NOTE — Interval H&P Note (Signed)
History and Physical Interval Note:  07/08/2016 3:29 PM  Brittany Miranda  has presented today for surgery, with the diagnosis of COLON POLYPS  The various methods of treatment have been discussed with the patient and family. After consideration of risks, benefits and other options for treatment, the patient has consented to  Procedure(s) with comments: COLONOSCOPY (N/A) - 145 as a surgical intervention .  The patient's history has been reviewed, patient examined, no change in status, stable for surgery.  I have reviewed the patient's chart and labs.  Questions were answered to the patient's satisfaction.    No change. Surveillance colonoscopy per plan.  The risks, benefits, limitations, alternatives and imponderables have been reviewed with the patient. Questions have been answered. All parties are agreeable.    Manus Rudd

## 2016-07-08 NOTE — H&P (View-Only) (Signed)
Primary Care Physician:  Purvis Kilts, MD Primary Gastroenterologist:  Dr. Gala Romney   Chief Complaint  Patient presents with  . Colonoscopy    5 year    HPI:   Brittany Miranda is a 65 y.o. female presenting today to schedule surveillance colonoscopy due to history of colonic adenomas in the remote past. Her last colonoscopy in 2012 by Dr. Gala Romney was without polyps.   She has no concerning lower or upper GI symptoms. No abdominal pain, N/V, rectal bleeding, changes in appetite, weight loss, dysphagia. 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     Past Medical History:  Diagnosis Date  . Anxiety   . Colon polyps    adenomas in remote past   . COPD (chronic obstructive pulmonary disease) ([place])   . Depression   . Osteopenia     Past Surgical History:  Procedure Laterality Date  . APPENDECTOMY    . COLONOSCOPY  11/2005   Dr. Ala Bent diverticula  . COLONOSCOPY  2000   [facility]-->per patient colon polyps, adenomatous    Current Outpatient Prescriptions  Medication Sig Dispense Refill  . ALPRAZolam (XANAX) 0.25 MG tablet Take 0.5 mg by mouth 3 (three) times daily as needed.     . Calcium Carbonate-Vitamin D (CALTRATE 600+D PO) Take 600 mg by mouth daily.    . diphenhydrAMINE (BENADRYL) 25 mg capsule Take 25 mg by mouth at bedtime as needed.    Marland Kitchen FLUoxetine (PROZAC) 20 MG capsule Take 20 mg by mouth daily.      Marland Kitchen ibuprofen (ADVIL,MOTRIN) 200 MG tablet Take 200 mg by mouth every 6 (six) hours as needed.      . Multiple Vitamin (MULTIVITAMIN) capsule Take 1 capsule by mouth daily.      . Omega-3 Fatty Acids (FISH OIL) 1000 MG CAPS Take 1,000 mg by mouth daily.    . polycarbophil (FIBERCON) 625 MG tablet Take 1,250 mg by mouth daily.      No current facility-administered medications for this visit.     Allergies as of 06/22/2016  . (No Known Allergies)      Family History  Problem Relation Age of Onset  . Kidney disease Mother   . Kidney disease Father   . Liver disease Neg Hx   . Colon cancer Neg Hx     Social History   Social History  . Marital status: Divorced    Spouse name: N/A  . Number of children: 1  . Years of education: N/A   Occupational History  . retired Retired    Psychologist, occupational Loss adjuster, chartered) at [employer]  . Smoking status: Former Smoker    Quit date: 04/04/1998  . Smokeless tobacco: Not on file  . Alcohol use No     Comment: socially  . Drug use: No  . Sexual activity: Not on file   Other Topics Concern  . Not on file   Social History Narrative  . No narrative on file    Review of Systems: Gen: Denies any fever, chills, fatigue, weight loss, lack of appetite.  CV: Denies chest pain, heart palpitations, peripheral edema, syncope.  Resp: Denies shortness of breath at rest or with exertion. Denies wheezing or cough.  GI: see HPI  GU : Denies urinary burning, urinary frequency, urinary hesitancy MS: +arthritis  Derm: Denies rash, itching,  dry skin Psych: Denies depression, anxiety, memory loss, and confusion Heme: Denies bruising, bleeding, and enlarged lymph nodes.  Physical Exam: BP 139/90   Pulse 78   Temp 97 F (36.1 C) (Oral)   Ht 5\' 4"  (1.626 m)   Wt 101 lb (45.8 kg)   BMI 17.34 kg/m  General:   Alert and oriented. Pleasant and cooperative. Well-nourished and well-developed.  Head:  Normocephalic and atraumatic. Eyes:  Without icterus, sclera clear and conjunctiva pink.  Ears:  Normal auditory acuity. Nose:  No deformity, discharge,  or lesions. Lungs:  Clear to auscultation bilaterally. No wheezes, rales, or rhonchi. No distress.  Heart:  S1, S2 present without murmurs appreciated.  Abdomen:  +BS, soft, non-tender and non-distended. No HSM noted. No guarding or rebound. No masses appreciated.  Rectal:  Deferred  Msk:  Symmetrical without gross deformities. Normal  posture. Extremities:  Without edema. Neurologic:  Alert and  oriented x4 Psych:  Alert and cooperative. Normal mood and affect.

## 2016-07-08 NOTE — Discharge Instructions (Addendum)
Diverticulosis Diverticulosis is the condition that develops when small pouches (diverticula) form in the wall of your colon. Your colon, or large intestine, is where water is absorbed and stool is formed. The pouches form when the inside layer of your colon pushes through weak spots in the outer layers of your colon. CAUSES  No one knows exactly what causes diverticulosis. RISK FACTORS  Being older than 39. Your risk for this condition increases with age. Diverticulosis is rare in people younger than 40 years. By age 14, almost everyone has it.  Eating a low-fiber diet.  Being frequently constipated.  Being overweight.  Not getting enough exercise.  Smoking.  Taking over-the-counter pain medicines, like aspirin and ibuprofen. SYMPTOMS  Most people with diverticulosis do not have symptoms. DIAGNOSIS  Because diverticulosis often has no symptoms, health care providers often discover the condition during an exam for other colon problems. In many cases, a health care provider will diagnose diverticulosis while using a flexible scope to examine the colon (colonoscopy). TREATMENT  If you have never developed an infection related to diverticulosis, you may not need treatment. If you have had an infection before, treatment may include:  Eating more fruits, vegetables, and grains.  Taking a fiber supplement.  Taking a live bacteria supplement (probiotic).  Taking medicine to relax your colon. HOME CARE INSTRUCTIONS   Drink at least 6-8 glasses of water each day to prevent constipation.  Try not to strain when you have a bowel movement.  Keep all follow-up appointments. If you have had an infection before:  Increase the fiber in your diet as directed by your health care provider or dietitian.  Take a dietary fiber supplement if your health care provider approves.  Only take medicines as directed by your health care provider. SEEK MEDICAL CARE IF:   You have abdominal  pain.  You have bloating.  You have cramps.  You have not gone to the bathroom in 3 days. SEEK IMMEDIATE MEDICAL CARE IF:   Your pain gets worse.  Yourbloating becomes very bad.  You have a fever or chills, and your symptoms suddenly get worse.  You begin vomiting.  You have bowel movements that are bloody or black. MAKE SURE YOU:  Understand these instructions.  Will watch your condition.  Will get help right away if you are not doing well or get worse.   This information is not intended to replace advice given to you by your health care provider. Make sure you discuss any questions you have with your health care provider.   Document Released: 07/28/2004 Document Revised: 11/05/2013 Document Reviewed: 09/25/2013 Elsevier Interactive Patient Education 2016 Elsevier Inc. Colon Polyps Polyps are lumps of extra tissue growing inside the body. Polyps can grow in the large intestine (colon). Most colon polyps are noncancerous (benign). However, some colon polyps can become cancerous over time. Polyps that are larger than a pea may be harmful. To be safe, caregivers remove and test all polyps. CAUSES  Polyps form when mutations in the genes cause your cells to grow and divide even though no more tissue is needed. RISK FACTORS There are a number of risk factors that can increase your chances of getting colon polyps. They include:  Being older than 50 years.  Family history of colon polyps or colon cancer.  Long-term colon diseases, such as colitis or Crohn disease.  Being overweight.  Smoking.  Being inactive.  Drinking too much alcohol. SYMPTOMS  Most small polyps do not cause symptoms.  If symptoms are present, they may include:  Blood in the stool. The stool may look dark red or black.  Constipation or diarrhea that lasts longer than 1 week. DIAGNOSIS People often do not know they have polyps until their caregiver finds them during a regular checkup. Your  caregiver can use 4 tests to check for polyps:  Digital rectal exam. The caregiver wears gloves and feels inside the rectum. This test would find polyps only in the rectum.  Barium enema. The caregiver puts a liquid called barium into your rectum before taking X-rays of your colon. Barium makes your colon look white. Polyps are dark, so they are easy to see in the X-ray pictures.  Sigmoidoscopy. A thin, flexible tube (sigmoidoscope) is placed into your rectum. The sigmoidoscope has a light and tiny camera in it. The caregiver uses the sigmoidoscope to look at the last third of your colon.  Colonoscopy. This test is like sigmoidoscopy, but the caregiver looks at the entire colon. This is the most common method for finding and removing polyps. TREATMENT  Any polyps will be removed during a sigmoidoscopy or colonoscopy. The polyps are then tested for cancer. PREVENTION  To help lower your risk of getting more colon polyps:  Eat plenty of fruits and vegetables. Avoid eating fatty foods.  Do not smoke.  Avoid drinking alcohol.  Exercise every day.  Lose weight if recommended by your caregiver.  Eat plenty of calcium and folate. Foods that are rich in calcium include milk, cheese, and broccoli. Foods that are rich in folate include chickpeas, kidney beans, and spinach. HOME CARE INSTRUCTIONS Keep all follow-up appointments as directed by your caregiver. You may need periodic exams to check for polyps. SEEK MEDICAL CARE IF: You notice bleeding during a bowel movement.   This information is not intended to replace advice given to you by your health care provider. Make sure you discuss any questions you have with your health care provider.   Document Released: 07/27/2004 Document Revised: 11/21/2014 Document Reviewed: 01/10/2012 Elsevier Interactive Patient Education 2016 Elsevier Inc.  Colonoscopy Discharge Instructions  Read the instructions outlined below and refer to this sheet in  the next few weeks. These discharge instructions provide you with general information on caring for yourself after you leave the hospital. Your doctor may also give you specific instructions. While your treatment has been planned according to the most current medical practices available, unavoidable complications occasionally occur. If you have any problems or questions after discharge, call Dr. Gala Romney at [phone]. ACTIVITY  You may resume your regular activity, but move at a slower pace for the next 24 hours.   Take frequent rest periods for the next 24 hours.   Walking will help get rid of the air and reduce the bloated feeling in your belly (abdomen).   No driving for 24 hours (because of the medicine (anesthesia) used during the test).    Do not sign any important legal documents or operate any machinery for 24 hours (because of the anesthesia used during the test).  NUTRITION  Drink plenty of fluids.   You may resume your normal diet as instructed by your doctor.   Begin with a light meal and progress to your normal diet. Heavy or fried foods are harder to digest and may make you feel sick to your stomach (nauseated).   Avoid alcoholic beverages for 24 hours or as instructed.  MEDICATIONS  You may resume your normal medications unless your doctor tells you otherwise.  WHAT YOU CAN EXPECT TODAY  Some feelings of bloating in the abdomen.   Passage of more gas than usual.   Spotting of blood in your stool or on the toilet paper.  IF YOU HAD POLYPS REMOVED DURING THE COLONOSCOPY:  No aspirin products for 7 days or as instructed.   No alcohol for 7 days or as instructed.   Eat a soft diet for the next 24 hours.  FINDING OUT THE RESULTS OF YOUR TEST Not all test results are available during your visit. If your test results are not back during the visit, make an appointment with your caregiver to find out the results. Do not assume everything is normal if you have not heard from  your caregiver or the medical facility. It is important for you to follow up on all of your test results.  SEEK IMMEDIATE MEDICAL ATTENTION IF:  You have more than a spotting of blood in your stool.   Your belly is swollen (abdominal distention).   You are nauseated or vomiting.   You have a temperature over 101.   You have abdominal pain or discomfort that is severe or gets worse throughout the day.    Colonic polyp and diverticulosis information provided  Further recommendations to follow pending review of pathology report

## 2016-07-12 ENCOUNTER — Encounter: Payer: Self-pay | Admitting: Gastroenterology

## 2016-07-13 ENCOUNTER — Encounter: Payer: Self-pay | Admitting: Internal Medicine

## 2016-07-14 ENCOUNTER — Encounter (HOSPITAL_COMMUNITY): Payer: Self-pay | Admitting: Internal Medicine

## 2016-07-21 DIAGNOSIS — Z1389 Encounter for screening for other disorder: Secondary | ICD-10-CM | POA: Diagnosis not present

## 2016-07-21 DIAGNOSIS — Z01419 Encounter for gynecological examination (general) (routine) without abnormal findings: Secondary | ICD-10-CM | POA: Diagnosis not present

## 2016-07-21 DIAGNOSIS — Z681 Body mass index (BMI) 19 or less, adult: Secondary | ICD-10-CM | POA: Diagnosis not present

## 2016-08-22 ENCOUNTER — Other Ambulatory Visit (HOSPITAL_COMMUNITY): Payer: Self-pay | Admitting: Family Medicine

## 2016-08-22 DIAGNOSIS — Z1231 Encounter for screening mammogram for malignant neoplasm of breast: Secondary | ICD-10-CM

## 2016-08-24 ENCOUNTER — Ambulatory Visit (HOSPITAL_COMMUNITY)
Admission: RE | Admit: 2016-08-24 | Discharge: 2016-08-24 | Disposition: A | Discharge status: Home / Self Care | Payer: Medicare HMO | Source: Ambulatory Visit | Attending: Family Medicine | Admitting: Family Medicine

## 2016-08-24 DIAGNOSIS — Z1231 Encounter for screening mammogram for malignant neoplasm of breast: Secondary | ICD-10-CM | POA: Diagnosis not present

## 2016-08-25 ENCOUNTER — Ambulatory Visit (HOSPITAL_COMMUNITY): Payer: Medicare HMO

## 2016-12-06 DIAGNOSIS — R69 Illness, unspecified: Secondary | ICD-10-CM | POA: Diagnosis not present

## 2016-12-06 DIAGNOSIS — Z1389 Encounter for screening for other disorder: Secondary | ICD-10-CM | POA: Diagnosis not present

## 2016-12-06 DIAGNOSIS — Z681 Body mass index (BMI) 19 or less, adult: Secondary | ICD-10-CM | POA: Diagnosis not present

## 2017-03-16 DIAGNOSIS — D225 Melanocytic nevi of trunk: Secondary | ICD-10-CM | POA: Diagnosis not present

## 2017-03-16 DIAGNOSIS — L821 Other seborrheic keratosis: Secondary | ICD-10-CM | POA: Diagnosis not present

## 2017-03-16 DIAGNOSIS — Z1283 Encounter for screening for malignant neoplasm of skin: Secondary | ICD-10-CM | POA: Diagnosis not present

## 2017-03-16 DIAGNOSIS — D1801 Hemangioma of skin and subcutaneous tissue: Secondary | ICD-10-CM | POA: Diagnosis not present

## 2017-03-16 DIAGNOSIS — C44519 Basal cell carcinoma of skin of other part of trunk: Secondary | ICD-10-CM | POA: Diagnosis not present

## 2017-06-05 DIAGNOSIS — M8580 Other specified disorders of bone density and structure, unspecified site: Secondary | ICD-10-CM | POA: Diagnosis not present

## 2017-06-05 DIAGNOSIS — J45909 Unspecified asthma, uncomplicated: Secondary | ICD-10-CM | POA: Diagnosis not present

## 2017-06-05 DIAGNOSIS — Z681 Body mass index (BMI) 19 or less, adult: Secondary | ICD-10-CM | POA: Diagnosis not present

## 2017-06-05 DIAGNOSIS — Z0000 Encounter for general adult medical examination without abnormal findings: Secondary | ICD-10-CM | POA: Diagnosis not present

## 2017-06-05 DIAGNOSIS — C4492 Squamous cell carcinoma of skin, unspecified: Secondary | ICD-10-CM | POA: Diagnosis not present

## 2017-06-05 DIAGNOSIS — Z1389 Encounter for screening for other disorder: Secondary | ICD-10-CM | POA: Diagnosis not present

## 2017-09-05 DIAGNOSIS — J45909 Unspecified asthma, uncomplicated: Secondary | ICD-10-CM | POA: Diagnosis not present

## 2017-09-05 DIAGNOSIS — Z681 Body mass index (BMI) 19 or less, adult: Secondary | ICD-10-CM | POA: Diagnosis not present

## 2017-09-05 DIAGNOSIS — R69 Illness, unspecified: Secondary | ICD-10-CM | POA: Diagnosis not present

## 2017-09-05 DIAGNOSIS — Z1389 Encounter for screening for other disorder: Secondary | ICD-10-CM | POA: Diagnosis not present

## 2017-09-12 DIAGNOSIS — Z23 Encounter for immunization: Secondary | ICD-10-CM | POA: Diagnosis not present

## 2017-10-13 ENCOUNTER — Other Ambulatory Visit (HOSPITAL_COMMUNITY): Payer: Self-pay | Admitting: Family Medicine

## 2017-10-13 DIAGNOSIS — Z1231 Encounter for screening mammogram for malignant neoplasm of breast: Secondary | ICD-10-CM

## 2017-10-20 ENCOUNTER — Encounter (HOSPITAL_COMMUNITY): Payer: Self-pay

## 2017-10-20 ENCOUNTER — Ambulatory Visit (HOSPITAL_COMMUNITY)
Admission: RE | Admit: 2017-10-20 | Discharge: 2017-10-20 | Disposition: A | Discharge status: Home / Self Care | Payer: Medicare HMO | Source: Ambulatory Visit | Attending: Family Medicine | Admitting: Family Medicine

## 2017-10-20 DIAGNOSIS — Z1231 Encounter for screening mammogram for malignant neoplasm of breast: Secondary | ICD-10-CM | POA: Diagnosis not present

## 2017-12-01 DIAGNOSIS — R69 Illness, unspecified: Secondary | ICD-10-CM | POA: Diagnosis not present

## 2017-12-01 DIAGNOSIS — Z681 Body mass index (BMI) 19 or less, adult: Secondary | ICD-10-CM | POA: Diagnosis not present

## 2017-12-01 DIAGNOSIS — Z1389 Encounter for screening for other disorder: Secondary | ICD-10-CM | POA: Diagnosis not present

## 2018-05-08 DIAGNOSIS — R69 Illness, unspecified: Secondary | ICD-10-CM | POA: Diagnosis not present

## 2018-05-08 DIAGNOSIS — J441 Chronic obstructive pulmonary disease with (acute) exacerbation: Secondary | ICD-10-CM | POA: Diagnosis not present

## 2018-05-08 DIAGNOSIS — J449 Chronic obstructive pulmonary disease, unspecified: Secondary | ICD-10-CM | POA: Diagnosis not present

## 2018-05-08 DIAGNOSIS — Z681 Body mass index (BMI) 19 or less, adult: Secondary | ICD-10-CM | POA: Diagnosis not present

## 2018-05-10 ENCOUNTER — Ambulatory Visit (HOSPITAL_COMMUNITY)
Admission: RE | Admit: 2018-05-10 | Discharge: 2018-05-10 | Disposition: A | Discharge status: Home / Self Care | Payer: Medicare HMO | Source: Ambulatory Visit | Attending: Family Medicine | Admitting: Family Medicine

## 2018-05-10 ENCOUNTER — Other Ambulatory Visit (HOSPITAL_COMMUNITY): Payer: Self-pay | Admitting: Family Medicine

## 2018-05-10 DIAGNOSIS — R05 Cough: Secondary | ICD-10-CM

## 2018-05-10 DIAGNOSIS — R059 Cough, unspecified: Secondary | ICD-10-CM

## 2018-05-10 DIAGNOSIS — J449 Chronic obstructive pulmonary disease, unspecified: Secondary | ICD-10-CM | POA: Diagnosis not present

## 2018-05-10 DIAGNOSIS — Z681 Body mass index (BMI) 19 or less, adult: Secondary | ICD-10-CM | POA: Diagnosis not present

## 2018-05-10 DIAGNOSIS — J069 Acute upper respiratory infection, unspecified: Secondary | ICD-10-CM | POA: Insufficient documentation

## 2018-05-31 DIAGNOSIS — Z681 Body mass index (BMI) 19 or less, adult: Secondary | ICD-10-CM | POA: Diagnosis not present

## 2018-05-31 DIAGNOSIS — R69 Illness, unspecified: Secondary | ICD-10-CM | POA: Diagnosis not present

## 2018-05-31 DIAGNOSIS — J45909 Unspecified asthma, uncomplicated: Secondary | ICD-10-CM | POA: Diagnosis not present

## 2018-05-31 DIAGNOSIS — J441 Chronic obstructive pulmonary disease with (acute) exacerbation: Secondary | ICD-10-CM | POA: Diagnosis not present

## 2018-06-06 DIAGNOSIS — Z1389 Encounter for screening for other disorder: Secondary | ICD-10-CM | POA: Diagnosis not present

## 2018-06-06 DIAGNOSIS — J45909 Unspecified asthma, uncomplicated: Secondary | ICD-10-CM | POA: Diagnosis not present

## 2018-06-06 DIAGNOSIS — Z0001 Encounter for general adult medical examination with abnormal findings: Secondary | ICD-10-CM | POA: Diagnosis not present

## 2018-06-06 DIAGNOSIS — Z0000 Encounter for general adult medical examination without abnormal findings: Secondary | ICD-10-CM | POA: Diagnosis not present

## 2018-06-06 DIAGNOSIS — R69 Illness, unspecified: Secondary | ICD-10-CM | POA: Diagnosis not present

## 2018-06-06 DIAGNOSIS — M8580 Other specified disorders of bone density and structure, unspecified site: Secondary | ICD-10-CM | POA: Diagnosis not present

## 2018-06-06 DIAGNOSIS — Z681 Body mass index (BMI) 19 or less, adult: Secondary | ICD-10-CM | POA: Diagnosis not present

## 2018-06-06 DIAGNOSIS — Z136 Encounter for screening for cardiovascular disorders: Secondary | ICD-10-CM | POA: Diagnosis not present

## 2018-07-26 ENCOUNTER — Emergency Department (HOSPITAL_COMMUNITY)
Admission: EM | Admit: 2018-07-26 | Discharge: 2018-07-26 | Disposition: A | Discharge status: Home / Self Care | Payer: Medicare HMO | Attending: Emergency Medicine | Admitting: Emergency Medicine

## 2018-07-26 ENCOUNTER — Encounter (HOSPITAL_COMMUNITY): Payer: Self-pay

## 2018-07-26 ENCOUNTER — Other Ambulatory Visit: Payer: Self-pay

## 2018-07-26 ENCOUNTER — Emergency Department (HOSPITAL_COMMUNITY): Payer: Medicare HMO

## 2018-07-26 DIAGNOSIS — S86821A Laceration of other muscle(s) and tendon(s) at lower leg level, right leg, initial encounter: Secondary | ICD-10-CM | POA: Insufficient documentation

## 2018-07-26 DIAGNOSIS — IMO0002 Reserved for concepts with insufficient information to code with codable children: Secondary | ICD-10-CM

## 2018-07-26 DIAGNOSIS — S71101A Unspecified open wound, right thigh, initial encounter: Secondary | ICD-10-CM | POA: Diagnosis not present

## 2018-07-26 DIAGNOSIS — Y93K1 Activity, walking an animal: Secondary | ICD-10-CM | POA: Diagnosis not present

## 2018-07-26 DIAGNOSIS — Z79899 Other long term (current) drug therapy: Secondary | ICD-10-CM | POA: Insufficient documentation

## 2018-07-26 DIAGNOSIS — S81811A Laceration without foreign body, right lower leg, initial encounter: Secondary | ICD-10-CM | POA: Diagnosis present

## 2018-07-26 DIAGNOSIS — S86822A Laceration of other muscle(s) and tendon(s) at lower leg level, left leg, initial encounter: Secondary | ICD-10-CM | POA: Diagnosis not present

## 2018-07-26 DIAGNOSIS — S71151A Open bite, right thigh, initial encounter: Secondary | ICD-10-CM | POA: Diagnosis not present

## 2018-07-26 DIAGNOSIS — W540XXA Bitten by dog, initial encounter: Secondary | ICD-10-CM | POA: Insufficient documentation

## 2018-07-26 DIAGNOSIS — Y9289 Other specified places as the place of occurrence of the external cause: Secondary | ICD-10-CM | POA: Diagnosis not present

## 2018-07-26 DIAGNOSIS — Y999 Unspecified external cause status: Secondary | ICD-10-CM | POA: Diagnosis not present

## 2018-07-26 DIAGNOSIS — T07XXXA Unspecified multiple injuries, initial encounter: Secondary | ICD-10-CM

## 2018-07-26 DIAGNOSIS — S81852A Open bite, left lower leg, initial encounter: Secondary | ICD-10-CM | POA: Diagnosis not present

## 2018-07-26 DIAGNOSIS — J449 Chronic obstructive pulmonary disease, unspecified: Secondary | ICD-10-CM | POA: Insufficient documentation

## 2018-07-26 LAB — BASIC METABOLIC PANEL
Anion gap: 8 (ref 5–15)
BUN: 10 mg/dL (ref 8–23)
CO2: 30 mmol/L (ref 22–32)
Calcium: 8.8 mg/dL — ABNORMAL LOW (ref 8.9–10.3)
Chloride: 96 mmol/L — ABNORMAL LOW (ref 98–111)
Creatinine, Ser: 0.78 mg/dL (ref 0.44–1.00)
GFR calc Af Amer: >60 mL/min (ref >60)
GFR calc non Af Amer: >60 mL/min (ref >60)
Glucose, Bld: 80 mg/dL (ref 70–99)
Potassium: 3.9 mmol/L (ref 3.5–5.1)
Sodium: 134 mmol/L — ABNORMAL LOW (ref 135–145)

## 2018-07-26 LAB — CBC WITH DIFFERENTIAL/PLATELET
Basophils Absolute: 0 10*3/uL (ref 0.0–0.1)
Basophils Relative: 1 %
Eosinophils Absolute: 0.1 10*3/uL (ref 0.0–0.7)
Eosinophils Relative: 1 %
HCT: 36.7 % (ref 36.0–46.0)
Hemoglobin: 12.5 g/dL (ref 12.0–15.0)
Lymphocytes Relative: 30 %
Lymphs Abs: 2.2 10*3/uL (ref 0.7–4.0)
MCH: 34.2 pg — ABNORMAL HIGH (ref 26.0–34.0)
MCHC: 34.1 g/dL (ref 30.0–36.0)
MCV: 100.5 fL — ABNORMAL HIGH (ref 78.0–100.0)
Monocytes Absolute: 0.6 10*3/uL (ref 0.1–1.0)
Monocytes Relative: 8 %
Neutro Abs: 4.4 10*3/uL (ref 1.7–7.7)
Neutrophils Relative %: 60 %
Platelets: 205 10*3/uL (ref 150–400)
RBC: 3.65 MIL/uL — ABNORMAL LOW (ref 3.87–5.11)
RDW: 13.3 % (ref 11.5–15.5)
WBC: 7.4 10*3/uL (ref 4.0–10.5)

## 2018-07-26 MED ORDER — HYDROMORPHONE HCL 1 MG/ML IJ SOLN
0.5000 mg | Freq: Once | INTRAMUSCULAR | Status: AC
  Administered 2018-07-26: 0.5 mg via INTRAVENOUS
  Filled 2018-07-26: qty 1

## 2018-07-26 MED ORDER — AMOXICILLIN-POT CLAVULANATE 875-125 MG PO TABS: 1.0000 | ORAL_TABLET | Freq: Two times a day (BID) | ORAL | 0 refills | Status: DC

## 2018-07-26 MED ORDER — OXYCODONE-ACETAMINOPHEN 5-325 MG PO TABS: 1.0000 | ORAL_TABLET | ORAL | 0 refills | Status: DC | PRN

## 2018-07-26 MED ORDER — PIPERACILLIN-TAZOBACTAM 3.375 G IVPB 30 MIN
3.3750 g | Freq: Once | INTRAVENOUS | Status: AC
  Administered 2018-07-26: 3.375 g via INTRAVENOUS
  Filled 2018-07-26: qty 50

## 2018-07-26 MED ORDER — SODIUM CHLORIDE 0.9 % IV SOLN
INTRAVENOUS | Status: DC
  Administered 2018-07-26: 09:00:00 via INTRAVENOUS

## 2018-07-26 NOTE — ED Notes (Addendum)
Dog is located at [place], [place].  Dog is up to date . Owners name is deana and dog is up to date

## 2018-07-26 NOTE — ED Provider Notes (Signed)
[facility] Provider Note   CSN: [id number] Arrival date & time: 9/Brittany/19  0719     History   Chief Complaint Chief Complaint  Patient presents with  . Animal Bite    HPI Brittany Miranda is a 67 y.o. female.  HPI   Brittany Miranda s/p dog bites. Happened just before arrival. She was out walking this morning and was bitten by local dog several times on LE. Several wounds with the worst being to L shin. No numbness or tingling. Able to walk after the injury. Not anticoagulated. Dog is known to her and she has actually been bitten by it before.   Past Medical History:  Diagnosis Date  . Anxiety   . Colon polyps    adenomas in remote past   . COPD (chronic obstructive pulmonary disease) ([place])   . Depression   . Osteopenia     Patient Active Problem List   Diagnosis Date Noted  . Colon polyps 04/05/2011    Past Surgical History:  Procedure Laterality Date  . APPENDECTOMY    . COLONOSCOPY  11/2005   Dr. Ala Bent diverticula  . COLONOSCOPY  2000   [facility]-->per patient colon polyps, adenomatous  . COLONOSCOPY N/A 07/08/2016   Procedure: COLONOSCOPY;  Surgeon: Daneil Dolin, MD;  Location: [facility];  Service: Endoscopy;  Laterality: N/A;  145     OB History   None      Home Medications    Prior to Admission medications   Medication Sig Start Date End Date Taking? Authorizing Provider  ALPRAZolam (XANAX) 0.25 MG tablet Take 0.5 mg by mouth 3 (three) times daily as needed for anxiety.     [provider]  Calcium Carbonate-Vitamin D (CALTRATE 600+D PO) Take 600 mg by mouth daily.    [provider]  diphenhydrAMINE (BENADRYL) 25 mg capsule Take 25 mg by mouth at bedtime as needed for sleep.     [provider]  FLUoxetine (PROZAC) 20 MG capsule Take 20 mg by mouth daily.      [provider]  ibuprofen (ADVIL,MOTRIN) 200 MG tablet Take 200 mg by mouth every 6 (six) hours as needed.      [provider]  Multiple Vitamin (MULTIVITAMIN) capsule Take 1 capsule by mouth daily.      [provider]  Omega-3 Fatty Acids (FISH OIL) 1000 MG CAPS Take 1,000 mg by mouth daily.    [provider]  polycarbophil (FIBERCON) 625 MG tablet Take 1,250 mg by mouth daily.     [provider]    Family History Family History  Problem Relation Age of Onset  . Kidney disease Mother   . Kidney disease Father   . Liver disease Neg Hx   . Colon cancer Neg Hx     Social History Social History   Tobacco Use  . Smoking status: Former Smoker    Last attempt to quit: 04/04/1998    Years since quitting: 20.3  Substance Use Topics  . Alcohol use: No    Comment: socially  . Drug use: No     Allergies   Patient has no known allergies.   Review of Systems Review of Systems  All systems reviewed and negative, other than as noted in HPI.  Physical Exam Updated Vital Signs BP (!) 186/105 (BP Location: Right Arm)   Pulse 81   Temp 97.8 F (36.6 C) (Oral)   Resp 18   Wt 45.8 kg  SpO2 99%   BMI 17.33 kg/m   Physical Exam  Constitutional: She appears well-developed and well-nourished. No distress.  HENT:  Head: Normocephalic and atraumatic.  Eyes: Conjunctivae are normal. Right eye exhibits no discharge. Left eye exhibits no discharge.  Neck: Neck supple.  Cardiovascular: Normal rate, regular rhythm and normal heart sounds. Exam reveals no gallop and no friction rub.  No murmur heard. Pulmonary/Chest: Effort normal and breath sounds normal. No respiratory distress.  Abdominal: Soft. She exhibits no distension. There is no tenderness.  Musculoskeletal: She exhibits no edema or tenderness.  Multiple small puncture type wounds to R lateral mid thigh. Multiple wounds to anterior mid/distal L shin as pictured below. Brittany cm laceration laterally with muscle injury and tendon laceration. 6 cm laceration medially. Several smaller wounds/abrasions just proximal to  these.   Neurological: She is alert.  Skin: Skin is warm and dry.  Psychiatric: She has a normal mood and affect. Her behavior is normal. Thought content normal.  Nursing note and vitals reviewed.        ED Treatments / Results  Labs (all labs ordered are listed, but only abnormal results are displayed) Labs Reviewed  CBC WITH DIFFERENTIAL/PLATELET - Abnormal; Notable for the following components:      Result Value   RBC 3.65 (*)    MCV 100.5 (*)    MCH 34.2 (*)    All other components within normal limits  BASIC METABOLIC PANEL - Abnormal; Notable for the following components:   Sodium 134 (*)    Chloride 96 (*)    Calcium 8.8 (*)    All other components within normal limits    EKG None  Radiology No results found.  Procedures Procedures (including critical care time)  Medications Ordered in ED Medications  piperacillin-tazobactam (ZOSYN) IVPB 3.375 g (has no administration in time range)  0.9 %  sodium chloride infusion (has no administration in time range)     Initial Impression / Assessment and Plan / ED Course  I have reviewed the triage vital signs and the nursing notes.  Pertinent labs & imaging results that were available during my care of the patient were reviewed by me and considered in my medical decision making (see chart for details).     67yF bitten by dog. Multiple wounds to LEs. One with muscle involvement and tendon laceration as pictured. Wounds irrigated well and loosely bandaged. NPO. Ortho consultation. Antibiotics. Pain meds. Tetanus is current. Dog known and animal control aware.  Discussed with Dr Aline Brochure, orthopedic surgery. Will see her in the office in the morning. Recommended packing wound and antibiotics.   Final Clinical Impressions(s) / ED Diagnoses   Final diagnoses:  Dog bite of multiple sites  Multiple lacerations  Tendon laceration    ED Discharge Orders    None       Brittany Manifold, MD 07/28/18 1143

## 2018-07-26 NOTE — ED Triage Notes (Addendum)
Pt reports she was walking and [place] dog in neighborhood came running toward her and attacked her left lower leg and has punctures to right lower thigh

## 2018-07-26 NOTE — ED Notes (Signed)
Animal control notifed

## 2018-07-26 NOTE — Discharge Instructions (Addendum)
Dr Aline Brochure wants to see you in his office tomorrow morning. Keep dressing and splint in place until then. Take antibiotics and pain medication as needed. Keep leg elevated.

## 2018-07-27 DIAGNOSIS — M19011 Primary osteoarthritis, right shoulder: Secondary | ICD-10-CM | POA: Diagnosis not present

## 2018-08-01 DIAGNOSIS — W540XXA Bitten by dog, initial encounter: Secondary | ICD-10-CM | POA: Diagnosis not present

## 2018-08-01 DIAGNOSIS — S71151A Open bite, right thigh, initial encounter: Secondary | ICD-10-CM | POA: Diagnosis not present

## 2018-08-01 DIAGNOSIS — S81852A Open bite, left lower leg, initial encounter: Secondary | ICD-10-CM | POA: Diagnosis not present

## 2018-08-09 ENCOUNTER — Encounter (HOSPITAL_BASED_OUTPATIENT_CLINIC_OR_DEPARTMENT_OTHER): Payer: Self-pay | Admitting: *Deleted

## 2018-08-09 ENCOUNTER — Other Ambulatory Visit: Payer: Self-pay

## 2018-08-09 DIAGNOSIS — S91151A Open bite of right great toe without damage to nail, initial encounter: Secondary | ICD-10-CM | POA: Diagnosis not present

## 2018-08-09 DIAGNOSIS — W540XXA Bitten by dog, initial encounter: Secondary | ICD-10-CM | POA: Diagnosis not present

## 2018-08-09 DIAGNOSIS — S81852A Open bite, left lower leg, initial encounter: Secondary | ICD-10-CM | POA: Diagnosis not present

## 2018-08-13 ENCOUNTER — Encounter (HOSPITAL_BASED_OUTPATIENT_CLINIC_OR_DEPARTMENT_OTHER): Payer: Self-pay | Admitting: *Deleted

## 2018-08-13 ENCOUNTER — Encounter (HOSPITAL_BASED_OUTPATIENT_CLINIC_OR_DEPARTMENT_OTHER)
Admission: RE | Disposition: A | Discharge status: Home / Self Care | Payer: Self-pay | Source: Ambulatory Visit | Attending: Specialist

## 2018-08-13 ENCOUNTER — Ambulatory Visit (HOSPITAL_BASED_OUTPATIENT_CLINIC_OR_DEPARTMENT_OTHER): Payer: Medicare HMO | Admitting: Anesthesiology

## 2018-08-13 ENCOUNTER — Other Ambulatory Visit: Payer: Self-pay

## 2018-08-13 ENCOUNTER — Ambulatory Visit (HOSPITAL_BASED_OUTPATIENT_CLINIC_OR_DEPARTMENT_OTHER)
Admission: RE | Admit: 2018-08-13 | Discharge: 2018-08-13 | Disposition: A | Discharge status: Home / Self Care | Payer: Medicare HMO | Source: Ambulatory Visit | Attending: Specialist | Admitting: Specialist

## 2018-08-13 DIAGNOSIS — Z79899 Other long term (current) drug therapy: Secondary | ICD-10-CM | POA: Diagnosis not present

## 2018-08-13 DIAGNOSIS — I96 Gangrene, not elsewhere classified: Secondary | ICD-10-CM | POA: Diagnosis not present

## 2018-08-13 DIAGNOSIS — F329 Major depressive disorder, single episode, unspecified: Secondary | ICD-10-CM | POA: Insufficient documentation

## 2018-08-13 DIAGNOSIS — Z87891 Personal history of nicotine dependence: Secondary | ICD-10-CM | POA: Insufficient documentation

## 2018-08-13 DIAGNOSIS — S91052A Open bite, left ankle, initial encounter: Secondary | ICD-10-CM | POA: Diagnosis not present

## 2018-08-13 DIAGNOSIS — S71151A Open bite, right thigh, initial encounter: Secondary | ICD-10-CM | POA: Diagnosis not present

## 2018-08-13 DIAGNOSIS — S81852A Open bite, left lower leg, initial encounter: Secondary | ICD-10-CM | POA: Diagnosis not present

## 2018-08-13 DIAGNOSIS — R69 Illness, unspecified: Secondary | ICD-10-CM | POA: Diagnosis not present

## 2018-08-13 DIAGNOSIS — W540XXA Bitten by dog, initial encounter: Secondary | ICD-10-CM | POA: Diagnosis not present

## 2018-08-13 DIAGNOSIS — F419 Anxiety disorder, unspecified: Secondary | ICD-10-CM | POA: Diagnosis not present

## 2018-08-13 DIAGNOSIS — M8580 Other specified disorders of bone density and structure, unspecified site: Secondary | ICD-10-CM | POA: Diagnosis not present

## 2018-08-13 DIAGNOSIS — Z86010 Personal history of colonic polyps: Secondary | ICD-10-CM | POA: Diagnosis not present

## 2018-08-13 DIAGNOSIS — J449 Chronic obstructive pulmonary disease, unspecified: Secondary | ICD-10-CM | POA: Insufficient documentation

## 2018-08-13 HISTORY — PX: SKIN SPLIT GRAFT: SHX444

## 2018-08-13 HISTORY — PX: IRRIGATION AND DEBRIDEMENT OF WOUND WITH SPLIT THICKNESS SKIN GRAFT: SHX5879

## 2018-08-13 SURGERY — IRRIGATION AND DEBRIDEMENT OF WOUND WITH SPLIT THICKNESS SKIN GRAFT
Anesthesia: General | Site: Leg Upper | Laterality: Left

## 2018-08-13 MED ORDER — MINERAL OIL LIGHT 100 % EX OIL
TOPICAL_OIL | CUTANEOUS | Status: AC
  Filled 2018-08-13: qty 25

## 2018-08-13 MED ORDER — FENTANYL CITRATE (PF) 100 MCG/2ML IJ SOLN: 25.0000 ug | INTRAMUSCULAR | Status: DC | PRN

## 2018-08-13 MED ORDER — ONDANSETRON HCL 4 MG/2ML IJ SOLN
INTRAMUSCULAR | Status: AC
  Filled 2018-08-13: qty 2

## 2018-08-13 MED ORDER — CEFAZOLIN SODIUM-DEXTROSE 2-4 GM/100ML-% IV SOLN
INTRAVENOUS | Status: AC
  Filled 2018-08-13: qty 100

## 2018-08-13 MED ORDER — FENTANYL CITRATE (PF) 100 MCG/2ML IJ SOLN
50.0000 ug | INTRAMUSCULAR | Status: DC | PRN
  Administered 2018-08-13: 100 ug via INTRAVENOUS

## 2018-08-13 MED ORDER — HYDROCODONE-ACETAMINOPHEN 7.5-325 MG PO TABS: 1.0000 | ORAL_TABLET | Freq: Once | ORAL | Status: DC | PRN

## 2018-08-13 MED ORDER — MINERAL OIL LIGHT 100 % EX OIL
TOPICAL_OIL | CUTANEOUS | Status: DC | PRN
  Administered 2018-08-13: 1 via TOPICAL

## 2018-08-13 MED ORDER — PROPOFOL 500 MG/50ML IV EMUL
INTRAVENOUS | Status: AC
  Filled 2018-08-13: qty 50

## 2018-08-13 MED ORDER — SUCCINYLCHOLINE CHLORIDE 200 MG/10ML IV SOSY
PREFILLED_SYRINGE | INTRAVENOUS | Status: AC
  Filled 2018-08-13: qty 10

## 2018-08-13 MED ORDER — CEFAZOLIN SODIUM-DEXTROSE 2-4 GM/100ML-% IV SOLN
2.0000 g | INTRAVENOUS | Status: AC
  Administered 2018-08-13: 2 g via INTRAVENOUS

## 2018-08-13 MED ORDER — DEXAMETHASONE SODIUM PHOSPHATE 10 MG/ML IJ SOLN
INTRAMUSCULAR | Status: AC
  Filled 2018-08-13: qty 1

## 2018-08-13 MED ORDER — PROPOFOL 10 MG/ML IV BOLUS
INTRAVENOUS | Status: DC | PRN
  Administered 2018-08-13: 150 mg via INTRAVENOUS

## 2018-08-13 MED ORDER — LIDOCAINE 2% (20 MG/ML) 5 ML SYRINGE
INTRAMUSCULAR | Status: AC
  Filled 2018-08-13: qty 5

## 2018-08-13 MED ORDER — SODIUM BICARBONATE 4 % IV SOLN
INTRAVENOUS | Status: AC
  Filled 2018-08-13: qty 5

## 2018-08-13 MED ORDER — CHLORHEXIDINE GLUCONATE CLOTH 2 % EX PADS: 6.0000 | MEDICATED_PAD | Freq: Once | CUTANEOUS | Status: DC

## 2018-08-13 MED ORDER — DEXAMETHASONE SODIUM PHOSPHATE 4 MG/ML IJ SOLN
INTRAMUSCULAR | Status: DC | PRN
  Administered 2018-08-13: 5 mg via INTRAVENOUS

## 2018-08-13 MED ORDER — SCOPOLAMINE 1 MG/3DAYS TD PT72: 1.0000 | MEDICATED_PATCH | Freq: Once | TRANSDERMAL | Status: DC | PRN

## 2018-08-13 MED ORDER — FENTANYL CITRATE (PF) 100 MCG/2ML IJ SOLN
INTRAMUSCULAR | Status: AC
  Filled 2018-08-13: qty 2

## 2018-08-13 MED ORDER — LIDOCAINE-EPINEPHRINE 0.5 %-1:200000 IJ SOLN
INTRAMUSCULAR | Status: DC | PRN
  Administered 2018-08-13: 60 mL

## 2018-08-13 MED ORDER — MEPERIDINE HCL 25 MG/ML IJ SOLN: 6.2500 mg | INTRAMUSCULAR | Status: DC | PRN

## 2018-08-13 MED ORDER — PHENYLEPHRINE 40 MCG/ML (10ML) SYRINGE FOR IV PUSH (FOR BLOOD PRESSURE SUPPORT)
PREFILLED_SYRINGE | INTRAVENOUS | Status: AC
  Filled 2018-08-13: qty 10

## 2018-08-13 MED ORDER — MIDAZOLAM HCL 2 MG/2ML IJ SOLN: 1.0000 mg | INTRAMUSCULAR | Status: DC | PRN

## 2018-08-13 MED ORDER — LIDOCAINE HCL (CARDIAC) PF 100 MG/5ML IV SOSY
PREFILLED_SYRINGE | INTRAVENOUS | Status: DC | PRN
  Administered 2018-08-13: 60 mg via INTRAVENOUS

## 2018-08-13 MED ORDER — PROPOFOL 10 MG/ML IV BOLUS
INTRAVENOUS | Status: AC
  Filled 2018-08-13: qty 20

## 2018-08-13 MED ORDER — LACTATED RINGERS IV SOLN
INTRAVENOUS | Status: DC
  Administered 2018-08-13 (×3): via INTRAVENOUS

## 2018-08-13 MED ORDER — EPHEDRINE 5 MG/ML INJ
INTRAVENOUS | Status: AC
  Filled 2018-08-13: qty 10

## 2018-08-13 SURGICAL SUPPLY — 86 items
APL SKNCLS STERI-STRIP NONHPOA (GAUZE/BANDAGES/DRESSINGS)
BAG DECANTER FOR FLEXI CONT (MISCELLANEOUS) IMPLANT
BALL CTTN LRG ABS STRL LF (GAUZE/BANDAGES/DRESSINGS) ×2
BANDAGE ACE 4X5 VEL STRL LF (GAUZE/BANDAGES/DRESSINGS) ×1 IMPLANT
BANDAGE ACE 6X5 VEL STRL LF (GAUZE/BANDAGES/DRESSINGS) IMPLANT
BENZOIN TINCTURE PRP APPL 2/3 (GAUZE/BANDAGES/DRESSINGS) IMPLANT
BLADE DERMATOME SS (BLADE) ×3 IMPLANT
BLADE HEX COATED 2.75 (ELECTRODE) ×3 IMPLANT
BLADE KNIFE PERSONA 10 (BLADE) ×3 IMPLANT
BLADE KNIFE PERSONA 15 (BLADE) ×3 IMPLANT
BLADE SURG 11 STRL SS (BLADE) ×1 IMPLANT
BNDG COHESIVE 4X5 TAN STRL (GAUZE/BANDAGES/DRESSINGS) IMPLANT
BNDG GAUZE ELAST 4 BULKY (GAUZE/BANDAGES/DRESSINGS) IMPLANT
BRUSH SCRUB EZ PLAIN DRY (MISCELLANEOUS) IMPLANT
CLEANER CAUTERY TIP 5X5 PAD (MISCELLANEOUS) ×2 IMPLANT
COTTONBALL LRG STERILE PKG (GAUZE/BANDAGES/DRESSINGS) ×1 IMPLANT
COVER BACK TABLE 60X90IN (DRAPES) ×3 IMPLANT
COVER MAYO STAND STRL (DRAPES) ×3 IMPLANT
DECANTER SPIKE VIAL GLASS SM (MISCELLANEOUS) IMPLANT
DEPRESSOR TONGUE BLADE STERILE (MISCELLANEOUS) ×1 IMPLANT
DERMACARRIERS GRAFT 1 TO 1.5 (DISPOSABLE)
DRAPE EXTREMITY T 121X128X90 (DRAPE) ×1 IMPLANT
DRAPE LAPAROTOMY 100X72 PEDS (DRAPES) IMPLANT
DRAPE U-SHAPE 76X120 STRL (DRAPES) IMPLANT
DRSG OPSITE 6X11 MED (GAUZE/BANDAGES/DRESSINGS) IMPLANT
DRSG PAD ABDOMINAL 8X10 ST (GAUZE/BANDAGES/DRESSINGS) ×6 IMPLANT
ELECT NDL TIP 2.8 STRL (NEEDLE) IMPLANT
ELECT NEEDLE TIP 2.8 STRL (NEEDLE) IMPLANT
ELECT REM PT RETURN 9FT ADLT (ELECTROSURGICAL) ×3
ELECTRODE REM PT RTRN 9FT ADLT (ELECTROSURGICAL) ×2 IMPLANT
FILTER 7/8 IN (FILTER) ×2 IMPLANT
GAUZE SPONGE 4X4 12PLY STRL (GAUZE/BANDAGES/DRESSINGS) IMPLANT
GAUZE SPONGE 4X4 12PLY STRL LF (GAUZE/BANDAGES/DRESSINGS) IMPLANT
GAUZE XEROFORM 1X8 LF (GAUZE/BANDAGES/DRESSINGS) IMPLANT
GAUZE XEROFORM 5X9 LF (GAUZE/BANDAGES/DRESSINGS) ×2 IMPLANT
GLOVE BIO SURGEON STRL SZ 6.5 (GLOVE) ×2 IMPLANT
GLOVE BIOGEL PI IND STRL 7.0 (GLOVE) ×1 IMPLANT
GLOVE BIOGEL PI INDICATOR 7.0 (GLOVE) ×1
GLOVE ECLIPSE 7.0 STRL STRAW (GLOVE) ×3 IMPLANT
GOWN STRL REUS W/ TWL LRG LVL3 (GOWN DISPOSABLE) ×1 IMPLANT
GOWN STRL REUS W/ TWL XL LVL3 (GOWN DISPOSABLE) ×2 IMPLANT
GOWN STRL REUS W/TWL LRG LVL3 (GOWN DISPOSABLE) ×3
GOWN STRL REUS W/TWL XL LVL3 (GOWN DISPOSABLE) ×3
GRAFT DERMACARRIERS 1 TO 1.5 (DISPOSABLE) IMPLANT
HANDPIECE INTERPULSE COAX TIP (DISPOSABLE)
IV NS IRRIG 3000ML ARTHROMATIC (IV SOLUTION) IMPLANT
MARKER SKIN DUAL TIP RULER LAB (MISCELLANEOUS) ×3 IMPLANT
NDL HYPO 25X1 1.5 SAFETY (NEEDLE) IMPLANT
NDL SPNL 18GX3.5 QUINCKE PK (NEEDLE) ×1 IMPLANT
NEEDLE HYPO 25X1 1.5 SAFETY (NEEDLE) ×3 IMPLANT
NEEDLE SPNL 18GX3.5 QUINCKE PK (NEEDLE) ×3 IMPLANT
NS IRRIG 1000ML POUR BTL (IV SOLUTION) ×3 IMPLANT
PACK BASIN DAY SURGERY FS (CUSTOM PROCEDURE TRAY) ×3 IMPLANT
PAD CLEANER CAUTERY TIP 5X5 (MISCELLANEOUS) ×1
PENCIL BUTTON HOLSTER BLD 10FT (ELECTRODE) ×3 IMPLANT
SET HNDPC FAN SPRY TIP SCT (DISPOSABLE) IMPLANT
SHEET MEDIUM DRAPE 40X70 STRL (DRAPES) ×1 IMPLANT
SHEETING SILICONE GEL EPI DERM (MISCELLANEOUS) IMPLANT
SPONGE LAP 18X18 RF (DISPOSABLE) ×3 IMPLANT
STAPLER VISISTAT 35W (STAPLE) IMPLANT
STOCKINETTE 4X48 STRL (DRAPES) IMPLANT
STOCKINETTE 6  STRL (DRAPES)
STOCKINETTE 6 STRL (DRAPES) IMPLANT
STOCKINETTE IMPERVIOUS LG (DRAPES) ×2 IMPLANT
STRIP CLOSURE SKIN 1/8X3 (GAUZE/BANDAGES/DRESSINGS) IMPLANT
STRIP SUTURE WOUND CLOSURE 1/2 (SUTURE) IMPLANT
SUT ETHILON 3 0 PS 1 (SUTURE) IMPLANT
SUT MNCRL AB 3-0 PS2 18 (SUTURE) IMPLANT
SUT MON AB 2-0 CT1 36 (SUTURE) IMPLANT
SUT MON AB 5-0 PS2 18 (SUTURE) IMPLANT
SUT PROLENE 4 0 PS 2 18 (SUTURE) IMPLANT
SUT VIC AB 0 CT1 27 (SUTURE)
SUT VIC AB 0 CT1 27XBRD ANBCTR (SUTURE) IMPLANT
SUT VICRYL 6 0 UNDY PS 6 (SUTURE) IMPLANT
SWAB COLLECTION DEVICE MRSA (MISCELLANEOUS) IMPLANT
SWAB CULTURE ESWAB REG 1ML (MISCELLANEOUS) IMPLANT
SYR 20CC LL (SYRINGE) IMPLANT
SYR 50ML LL SCALE MARK (SYRINGE) ×5 IMPLANT
SYR BULB 3OZ (MISCELLANEOUS) IMPLANT
SYR CONTROL 10ML LL (SYRINGE) ×1 IMPLANT
TAPE HYPAFIX 6X30 (GAUZE/BANDAGES/DRESSINGS) IMPLANT
TOWEL GREEN STERILE FF (TOWEL DISPOSABLE) ×6 IMPLANT
TUBE CONNECTING 20X1/4 (TUBING) IMPLANT
UNDERPAD 30X30 (UNDERPADS AND DIAPERS) ×3 IMPLANT
VAC PENCILS W/TUBING CLEAR (MISCELLANEOUS) ×2 IMPLANT
YANKAUER SUCT BULB TIP NO VENT (SUCTIONS) IMPLANT

## 2018-08-13 NOTE — Op Note (Signed)
NAME: Brittany Miranda, Brittany Miranda MEDICAL RECORD [id number] ACCOUNT [id number] DATE OF BIRTH:June 30, 1951 FACILITY: [facility] LOCATION: [facility] PHYSICIAN:Willella Harding Octavia Heir, MD  OPERATIVE REPORT  DATE OF PROCEDURE:  08/13/2018  INDICATIONS:  This is a 67 year old lady who underwent severe dog bite to her left lower leg and ankle areas approximately 10 days ago.  The area is full thickness skin loss.  I have been treating her for granulation tissue buildup and was clean enough  today to have a split thickness skin graft done from the left thigh.  The area measured about 20 x 12 inches.  DESCRIPTION OF PROCEDURE:  She was taken to the operating room and placed on the operating room table in the supine position, was given adequate general anesthesia and intubated orally.  Prep was done to the left groin and thigh areas separately with  Hibiclens soap and solution as well as a left lower leg with Hibiclens soap and solution walled off with a stockinette and sterile towels and drapes so as to make a sterile field.  We used the dermatome to take a split thickness skin graft from the left  upper thigh area anteriorly, 4 inches in width, approximately 10 inches or more in length.  The wounds were covered with sterile dressings including Xeroform, Telfa, 4 x 4, ABDs, Hypafix tape.  Next, on the graft site area of the dog bite, debridement  was done until we got the areas to granulation tissue to bleed nicely.  Exposed tendon was debrided away from the area and after this, the split thickness skin graft was taken and placed over the 2 defects in the lower leg and secured with 3-0 nylon  running suture.  Several sutures were left alone on 4 corners to make a stent dressing after the graft had been cut to allow drainage.  Xeroform, 4 x 4's, ABDs, Hypafix tape were placed over and after the areas were tied over with 4 knots Kerlix and Ace  wraps were placed as well.  She tolerated all procedures very well and was taken  to recovery in good condition.  ESTIMATED BLOOD LOSS:  Less than 50 mL.  COMPLICATIONS:  None.  AN/NUANCE  D:08/13/2018 T:08/13/2018 JOB:[id number]

## 2018-08-13 NOTE — Anesthesia Procedure Notes (Signed)
Procedure Name: LMA Insertion °Performed by: Kissa Campoy D, CRNA °Pre-anesthesia Checklist: Patient identified, Emergency Drugs available, Suction available and Patient being monitored °Patient Re-evaluated:Patient Re-evaluated prior to induction °Oxygen Delivery Method: Circle system utilized °Preoxygenation: Pre-oxygenation with 100% oxygen °Induction Type: IV induction °Ventilation: Mask ventilation without difficulty °LMA: LMA inserted °LMA Size: 3.0 °Number of attempts: 1 °Airway Equipment and Method: Bite block °Placement Confirmation: positive ETCO2 °Tube secured with: Tape °Dental Injury: Teeth and Oropharynx as per pre-operative assessment  ° ° ° ° ° ° °

## 2018-08-13 NOTE — Anesthesia Postprocedure Evaluation (Signed)
Anesthesia Post Note  Patient: Brittany Miranda  Procedure(s) Performed: IRRIGATION AND DEBRIDEMENT OF LEFT LEG WOUND WITH SPLIT THICKNESS SKIN GRAFT (Left Foot) SKIN GRAFT SPLIT THICKNESS (Left Leg Upper)     Patient location during evaluation: PACU Anesthesia Type: General Level of consciousness: awake and alert and oriented Pain management: pain level controlled Vital Signs Assessment: post-procedure vital signs reviewed and stable Respiratory status: spontaneous breathing, nonlabored ventilation and respiratory function stable Cardiovascular status: blood pressure returned to baseline and stable Postop Assessment: no apparent nausea or vomiting Anesthetic complications: no    Last Vitals:  Vitals:   08/13/18 1515 08/13/18 1530  BP: (!) 153/90 (!) 164/94  Pulse: 89 90  Resp: 13 13  Temp:    SpO2: 100% 95%    Last Pain:  Vitals:   08/13/18 1530  TempSrc:   PainSc: 0-No pain                 Keinan Brouillet A.

## 2018-08-13 NOTE — Transfer of Care (Signed)
Immediate Anesthesia Transfer of Care Note  Patient: Brittany Miranda  Procedure(s) Performed: IRRIGATION AND DEBRIDEMENT OF LEFT LEG WOUND WITH SPLIT THICKNESS SKIN GRAFT (Left Foot) SKIN GRAFT SPLIT THICKNESS (Left Leg Upper)  Patient Location: PACU  Anesthesia Type:General  Level of Consciousness: awake, alert  and oriented  Airway & Oxygen Therapy: Patient Spontanous Breathing and Patient connected to face mask oxygen  Post-op Assessment: Report given to RN and Post -op Vital signs reviewed and stable  Post vital signs: Reviewed and stable  Last Vitals:  Vitals Value Taken Time  BP 150/85 08/13/2018  3:04 PM  Temp    Pulse 94 08/13/2018  3:05 PM  Resp 17 08/13/2018  3:05 PM  SpO2 100 % 08/13/2018  3:05 PM  Vitals shown include unvalidated device data.  Last Pain:  Vitals:   08/13/18 1156  TempSrc: Oral  PainSc: 0-No pain         Complications: No apparent anesthesia complications

## 2018-08-13 NOTE — Anesthesia Preprocedure Evaluation (Signed)
Anesthesia Evaluation  Patient identified by MRN, date of birth, ID band Patient awake    Reviewed: Allergy & Precautions, NPO status , Patient's Chart, lab work & pertinent test results  Airway Mallampati: I  TM Distance: >3 FB Neck ROM: Full    Dental no notable dental hx. (+) Teeth Intact   Pulmonary COPD,  COPD inhaler, former smoker,    Pulmonary exam normal        Cardiovascular negative cardio ROS Normal cardiovascular exam Rhythm:Regular Rate:Normal     Neuro/Psych PSYCHIATRIC DISORDERS Anxiety Depression negative neurological ROS     GI/Hepatic negative GI ROS, Neg liver ROS,   Endo/Other  negative endocrine ROS  Renal/GU negative Renal ROS  negative genitourinary   Musculoskeletal negative musculoskeletal ROS (+)   Abdominal   Peds  Hematology negative hematology ROS (+)   Anesthesia Other Findings   Reproductive/Obstetrics                             Anesthesia Physical Anesthesia Plan  ASA: II  Anesthesia Plan: General   Post-op Pain Management:    Induction: Intravenous  PONV Risk Score and Plan: 3 and Ondansetron, Treatment may vary due to age or medical condition and Dexamethasone  Airway Management Planned: LMA  Additional Equipment:   Intra-op Plan:   Post-operative Plan: Extubation in OR  Informed Consent: I have reviewed the patients History and Physical, chart, labs and discussed the procedure including the risks, benefits and alternatives for the proposed anesthesia with the patient or authorized representative who has indicated his/her understanding and acceptance.   Dental advisory given  Plan Discussed with: CRNA and Surgeon  Anesthesia Plan Comments:         Anesthesia Quick Evaluation

## 2018-08-13 NOTE — H&P (Signed)
Brittany Miranda is an 67 y.o. female.   Chief Complaint: Severe dog bite ll leg HPI: Bit by neighbors dog about 10 days ago  Has large  defect ll leg deep to tendons  Past Medical History:  Diagnosis Date  . Anxiety   . Colon polyps    adenomas in remote past   . COPD (chronic obstructive pulmonary disease) ([place])   . Depression   . Osteopenia     Past Surgical History:  Procedure Laterality Date  . APPENDECTOMY    . COLONOSCOPY  11/2005   Dr. Ala Bent diverticula  . COLONOSCOPY  2000   [facility]-->per patient colon polyps, adenomatous  . COLONOSCOPY N/A 07/08/2016   Procedure: COLONOSCOPY;  Surgeon: Daneil Dolin, MD;  Location: [facility];  Service: Endoscopy;  Laterality: N/A;  145    Family History  Problem Relation Age of Onset  . Kidney disease Mother   . Kidney disease Father   . Liver disease Neg Hx   . Colon cancer Neg Hx    Social History:  reports that she quit smoking about 20 years ago. She has never used smokeless tobacco. She reports that she does not drink alcohol or use drugs.  Allergies: No Known Allergies  Medications Prior to Admission  Medication Sig Dispense Refill  . ALPRAZolam (XANAX) 0.25 MG tablet Take 0.5 mg by mouth 3 (three) times daily as needed for anxiety.     Marland Kitchen BEVESPI AEROSPHERE 9-4.8 MCG/ACT AERO Inhale 1 puff into the lungs daily.    . Calcium Carbonate-Vitamin D (CALTRATE 600+D PO) Take 600 mg by mouth daily.    . diphenhydrAMINE (BENADRYL) 25 mg capsule Take 25 mg by mouth at bedtime as needed for sleep.     Marland Kitchen FLUoxetine (PROZAC) 20 MG capsule Take 20 mg by mouth daily.      . Multiple Vitamin (MULTIVITAMIN) capsule Take 1 capsule by mouth daily.      . Omega-3 Fatty Acids (FISH OIL) 1000 MG CAPS Take 1,000 mg by mouth daily.    . polycarbophil (FIBERCON) 625 MG tablet Take 1,250 mg by mouth daily.     Marland Kitchen albuterol (PROVENTIL HFA;VENTOLIN HFA) 108 (90 Base) MCG/ACT inhaler Inhale 1 puff into the lungs every 6 (six) hours as  needed.    Marland Kitchen ibuprofen (ADVIL,MOTRIN) 200 MG tablet Take 200 mg by mouth every 6 (six) hours as needed.        No results found for this or any previous visit (from the past 48 hour(s)). No results found.  Review of Systems  Constitutional: Negative.   HENT: Negative.   Eyes: Negative.   Respiratory: Negative.   Cardiovascular: Negative.   Gastrointestinal: Negative.   Genitourinary: Negative.   Musculoskeletal: Negative.   Skin: Negative.   Neurological: Negative.   Endo/Heme/Allergies: Negative.   Psychiatric/Behavioral: Negative.     Blood pressure (!) 152/82, pulse 78, temperature 97.6 F (36.4 C), temperature source Oral, resp. rate 18, height 5\' 4"  (1.626 m), weight 43.5 kg, SpO2 100 %. Physical Exam  Constitutional: She appears well-developed and well-nourished.  HENT:  Head: Normocephalic and atraumatic.  Eyes: Pupils are equal, round, and reactive to light. EOM and lids are normal.  Neck: Normal range of motion. Neck supple. Normal carotid pulses present. Carotid bruit is not present. No thyroid mass and no thyromegaly present.  Cardiovascular: Normal rate, regular rhythm, S1 normal, S2 normal and normal heart sounds.  Respiratory: Effort normal and breath sounds normal.  GI: Soft. Bowel  sounds are normal.  Genitourinary: Pelvic exam was performed with patient prone.  Musculoskeletal: Normal range of motion.  Lymphadenopathy:       Head (right side): No submental, no submandibular, no preauricular and no posterior auricular adenopathy present.    She has cervical adenopathy.       Right cervical: No superficial cervical, no deep cervical and no posterior cervical adenopathy present.      Right axillary: No pectoral and no lateral adenopathy present.       Right: No inguinal adenopathy present.  Neurological: She is alert. She has normal reflexes.  Skin: Skin is warm and dry. There is erythema.  Psychiatric: She has a normal mood and affect. Her speech is normal and  behavior is normal. Judgment normal. Cognition and memory are normal.  severely open defect llleg with exposed tendon approx15x28 cm    Assessment/Plan Severely open defect llleg for debridement and slit thickness skin graft  Tashika Goodin L, MD 08/13/2018, 1:42 PM

## 2018-08-13 NOTE — Discharge Instructions (Signed)
Activity (include date of return to work if known) As tolerated: NO showers NO driving No heavy activities  Diet:regular No restrictions:  Wound Care: Keep dressing clean & dry  Do not change dressings  Special Instructions:    Call Doctor if any unusual problems occur such as pain, excessive Bleeding, unrelieved Nausea/vomiting, Fever &/or chills   Follow-up appointment: Please call the office.  The patient received discharge instruction from:___________________________________________   Patient signature ________________________________________ / Date___________    Signature of individual providing instructions/ Date________________          Post Anesthesia Home Care Instructions  Activity: Get plenty of rest for the remainder of the day. A responsible individual must stay with you for 24 hours following the procedure.  For the next 24 hours, DO NOT: -Drive a car -Paediatric nurse -Drink alcoholic beverages -Take any medication unless instructed by your physician -Make any legal decisions or sign important papers.  Meals: Start with liquid foods such as gelatin or soup. Progress to regular foods as tolerated. Avoid greasy, spicy, heavy foods. If nausea and/or vomiting occur, drink only clear liquids until the nausea and/or vomiting subsides. Call your physician if vomiting continues.  Special Instructions/Symptoms: Your throat may feel dry or sore from the anesthesia or the breathing tube placed in your throat during surgery. If this causes discomfort, gargle with warm salt water. The discomfort should disappear within 24 hours.  If you had a scopolamine patch placed behind your ear for the management of post- operative nausea and/or vomiting:  1. The medication in the patch is effective for 72 hours, after which it should be removed.  Wrap patch in a tissue and discard in the trash. Wash hands thoroughly with soap and water. 2. You may remove the patch earlier  than 72 hours if you experience unpleasant side effects which may include dry mouth, dizziness or visual disturbances. 3. Avoid touching the patch. Wash your hands with soap and water after contact with the patch.

## 2018-08-14 ENCOUNTER — Encounter (HOSPITAL_BASED_OUTPATIENT_CLINIC_OR_DEPARTMENT_OTHER): Payer: Self-pay | Admitting: Specialist

## 2018-08-14 NOTE — Addendum Note (Signed)
Addendum  created 08/14/18 1320 by Tawni Millers, CRNA   Charge Capture section accepted

## 2018-08-21 DIAGNOSIS — J449 Chronic obstructive pulmonary disease, unspecified: Secondary | ICD-10-CM | POA: Diagnosis not present

## 2018-08-21 DIAGNOSIS — J22 Unspecified acute lower respiratory infection: Secondary | ICD-10-CM | POA: Diagnosis not present

## 2018-08-21 DIAGNOSIS — Z1389 Encounter for screening for other disorder: Secondary | ICD-10-CM | POA: Diagnosis not present

## 2018-08-21 DIAGNOSIS — Z681 Body mass index (BMI) 19 or less, adult: Secondary | ICD-10-CM | POA: Diagnosis not present

## 2018-08-23 ENCOUNTER — Encounter (HOSPITAL_COMMUNITY): Payer: Self-pay

## 2018-08-23 ENCOUNTER — Emergency Department (HOSPITAL_COMMUNITY): Payer: Medicare HMO

## 2018-08-23 ENCOUNTER — Inpatient Hospital Stay (HOSPITAL_COMMUNITY)
Admission: EM | Admit: 2018-08-23 | Discharge: 2018-08-26 | DRG: 190 | Disposition: A | Discharge status: Home / Self Care | Payer: Medicare HMO | Attending: Family Medicine | Admitting: Family Medicine

## 2018-08-23 ENCOUNTER — Other Ambulatory Visit: Payer: Self-pay

## 2018-08-23 DIAGNOSIS — R69 Illness, unspecified: Secondary | ICD-10-CM | POA: Diagnosis not present

## 2018-08-23 DIAGNOSIS — J9601 Acute respiratory failure with hypoxia: Secondary | ICD-10-CM | POA: Diagnosis not present

## 2018-08-23 DIAGNOSIS — J449 Chronic obstructive pulmonary disease, unspecified: Secondary | ICD-10-CM | POA: Diagnosis present

## 2018-08-23 DIAGNOSIS — Z79899 Other long term (current) drug therapy: Secondary | ICD-10-CM

## 2018-08-23 DIAGNOSIS — J441 Chronic obstructive pulmonary disease with (acute) exacerbation: Principal | ICD-10-CM | POA: Diagnosis present

## 2018-08-23 DIAGNOSIS — R0602 Shortness of breath: Secondary | ICD-10-CM

## 2018-08-23 DIAGNOSIS — F419 Anxiety disorder, unspecified: Secondary | ICD-10-CM | POA: Diagnosis present

## 2018-08-23 DIAGNOSIS — Z7951 Long term (current) use of inhaled steroids: Secondary | ICD-10-CM | POA: Diagnosis not present

## 2018-08-23 DIAGNOSIS — F418 Other specified anxiety disorders: Secondary | ICD-10-CM | POA: Diagnosis present

## 2018-08-23 DIAGNOSIS — Z87891 Personal history of nicotine dependence: Secondary | ICD-10-CM

## 2018-08-23 DIAGNOSIS — Z23 Encounter for immunization: Secondary | ICD-10-CM

## 2018-08-23 DIAGNOSIS — J9611 Chronic respiratory failure with hypoxia: Secondary | ICD-10-CM | POA: Diagnosis present

## 2018-08-23 LAB — COMPREHENSIVE METABOLIC PANEL
ALBUMIN: 3.6 g/dL (ref 3.5–5.0)
ALT: 20 U/L (ref 0–44)
ANION GAP: 11 (ref 5–15)
AST: 33 U/L (ref 15–41)
Alkaline Phosphatase: 77 U/L (ref 38–126)
BUN: 7 mg/dL — ABNORMAL LOW (ref 8–23)
CO2: 25 mmol/L (ref 22–32)
Calcium: 8.9 mg/dL (ref 8.9–10.3)
Chloride: 98 mmol/L (ref 98–111)
Creatinine, Ser: 0.58 mg/dL (ref 0.44–1.00)
GFR calc Af Amer: >60 mL/min (ref >60)
GFR calc non Af Amer: >60 mL/min (ref >60)
GLUCOSE: 96 mg/dL (ref 70–99)
POTASSIUM: 4 mmol/L (ref 3.5–5.1)
SODIUM: 134 mmol/L — AB (ref 135–145)
TOTAL PROTEIN: 7 g/dL (ref 6.5–8.1)
Total Bilirubin: 0.8 mg/dL (ref 0.3–1.2)

## 2018-08-23 LAB — CBC WITH DIFFERENTIAL/PLATELET
Abs Immature Granulocytes: 0.02 10*3/uL (ref 0.00–0.07)
BASOS ABS: 0 10*3/uL (ref 0.0–0.1)
BASOS PCT: 1 %
EOS ABS: 0.2 10*3/uL (ref 0.0–0.5)
Eosinophils Relative: 4 %
HCT: 41 % (ref 36.0–46.0)
Hemoglobin: 13.2 g/dL (ref 12.0–15.0)
IMMATURE GRANULOCYTES: 0 %
Lymphocytes Relative: 15 %
Lymphs Abs: 0.7 10*3/uL (ref 0.7–4.0)
MCH: 33.9 pg (ref 26.0–34.0)
MCHC: 32.2 g/dL (ref 30.0–36.0)
MCV: 105.4 fL — ABNORMAL HIGH (ref 80.0–100.0)
Monocytes Absolute: 0.7 10*3/uL (ref 0.1–1.0)
Monocytes Relative: 14 %
NEUTROS PCT: 66 %
NRBC: 0 % (ref 0.0–0.2)
Neutro Abs: 3.3 10*3/uL (ref 1.7–7.7)
Platelets: 218 10*3/uL (ref 150–400)
RBC: 3.89 MIL/uL (ref 3.87–5.11)
RDW: 13.1 % (ref 11.5–15.5)
WBC: 5 10*3/uL (ref 4.0–10.5)

## 2018-08-23 MED ORDER — METHYLPREDNISOLONE SODIUM SUCC 125 MG IJ SOLR
125.0000 mg | Freq: Once | INTRAMUSCULAR | Status: AC
  Administered 2018-08-23: 125 mg via INTRAVENOUS
  Filled 2018-08-23: qty 2

## 2018-08-23 MED ORDER — ONDANSETRON HCL 4 MG PO TABS: 4.0000 mg | ORAL_TABLET | Freq: Four times a day (QID) | ORAL | Status: DC | PRN

## 2018-08-23 MED ORDER — ALBUTEROL SULFATE (2.5 MG/3ML) 0.083% IN NEBU
2.5000 mg | INHALATION_SOLUTION | Freq: Once | RESPIRATORY_TRACT | Status: AC
  Administered 2018-08-23: 2.5 mg via RESPIRATORY_TRACT
  Filled 2018-08-23: qty 3

## 2018-08-23 MED ORDER — ONDANSETRON HCL 4 MG/2ML IJ SOLN: 4.0000 mg | Freq: Four times a day (QID) | INTRAMUSCULAR | Status: DC | PRN

## 2018-08-23 MED ORDER — GUAIFENESIN ER 600 MG PO TB12
600.0000 mg | ORAL_TABLET | Freq: Two times a day (BID) | ORAL | Status: DC
  Administered 2018-08-23 – 2018-08-26 (×6): 600 mg via ORAL
  Filled 2018-08-23 (×6): qty 1

## 2018-08-23 MED ORDER — IPRATROPIUM-ALBUTEROL 0.5-2.5 (3) MG/3ML IN SOLN
3.0000 mL | Freq: Once | RESPIRATORY_TRACT | Status: AC
  Administered 2018-08-23: 3 mL via RESPIRATORY_TRACT
  Filled 2018-08-23: qty 3

## 2018-08-23 MED ORDER — HEPARIN SODIUM (PORCINE) 5000 UNIT/ML IJ SOLN
5000.0000 [IU] | Freq: Three times a day (TID) | INTRAMUSCULAR | Status: DC
  Filled 2018-08-23 (×4): qty 1

## 2018-08-23 MED ORDER — METHYLPREDNISOLONE SODIUM SUCC 40 MG IJ SOLR
40.0000 mg | Freq: Two times a day (BID) | INTRAMUSCULAR | Status: DC
  Administered 2018-08-24: 40 mg via INTRAVENOUS
  Filled 2018-08-23: qty 1

## 2018-08-23 MED ORDER — FLUOXETINE HCL 20 MG PO CAPS
20.0000 mg | ORAL_CAPSULE | Freq: Every day | ORAL | Status: DC
  Administered 2018-08-23 – 2018-08-26 (×4): 20 mg via ORAL
  Filled 2018-08-23 (×4): qty 1

## 2018-08-23 MED ORDER — ORAL CARE MOUTH RINSE
15.0000 mL | Freq: Two times a day (BID) | OROMUCOSAL | Status: DC
  Administered 2018-08-23 – 2018-08-25 (×4): 15 mL via OROMUCOSAL

## 2018-08-23 MED ORDER — MAGNESIUM SULFATE 2 GM/50ML IV SOLN
2.0000 g | Freq: Once | INTRAVENOUS | Status: AC
  Administered 2018-08-23: 2 g via INTRAVENOUS
  Filled 2018-08-23: qty 50

## 2018-08-23 MED ORDER — IPRATROPIUM-ALBUTEROL 0.5-2.5 (3) MG/3ML IN SOLN
3.0000 mL | Freq: Four times a day (QID) | RESPIRATORY_TRACT | Status: DC
  Administered 2018-08-23 – 2018-08-26 (×11): 3 mL via RESPIRATORY_TRACT
  Filled 2018-08-23 (×11): qty 3

## 2018-08-23 MED ORDER — POLYETHYLENE GLYCOL 3350 17 G PO PACK: 17.0000 g | PACK | Freq: Every day | ORAL | Status: DC | PRN

## 2018-08-23 MED ORDER — CALCIUM POLYCARBOPHIL 625 MG PO TABS
1250.0000 mg | ORAL_TABLET | Freq: Every day | ORAL | Status: DC
  Administered 2018-08-23: 1250 mg via ORAL
  Filled 2018-08-23 (×5): qty 2

## 2018-08-23 MED ORDER — AZITHROMYCIN 250 MG PO TABS
500.0000 mg | ORAL_TABLET | Freq: Every day | ORAL | Status: DC
  Administered 2018-08-23 – 2018-08-26 (×4): 500 mg via ORAL
  Filled 2018-08-23 (×4): qty 2

## 2018-08-23 MED ORDER — ACETAMINOPHEN 650 MG RE SUPP: 650.0000 mg | Freq: Four times a day (QID) | RECTAL | Status: DC | PRN

## 2018-08-23 MED ORDER — ACETAMINOPHEN 325 MG PO TABS: 650.0000 mg | ORAL_TABLET | Freq: Four times a day (QID) | ORAL | Status: DC | PRN

## 2018-08-23 MED ORDER — IPRATROPIUM-ALBUTEROL 0.5-2.5 (3) MG/3ML IN SOLN
3.0000 mL | RESPIRATORY_TRACT | Status: DC | PRN
  Administered 2018-08-26: 3 mL via RESPIRATORY_TRACT
  Filled 2018-08-23: qty 3

## 2018-08-23 MED ORDER — ALPRAZOLAM 0.5 MG PO TABS
0.5000 mg | ORAL_TABLET | Freq: Three times a day (TID) | ORAL | Status: DC | PRN
  Administered 2018-08-23 – 2018-08-25 (×5): 0.5 mg via ORAL
  Filled 2018-08-23 (×5): qty 1

## 2018-08-23 NOTE — ED Notes (Signed)
edp notified about pt's sats dropping to 79%

## 2018-08-23 NOTE — ED Notes (Signed)
Pt ambulated with Brittany Miranda, O2 dropped to 79% on room air, pt walked back to bed, O2 went to 97% on 3 L

## 2018-08-23 NOTE — Progress Notes (Signed)
Patient was bitten by a dog on September 12th.  She had surgery to repair the dog bite. A skin graft was taken from left thigh to place on wound. Patient said that she had been to the doctor on Tuesday and she would not let me remove the dressings to see the wound/skin.

## 2018-08-23 NOTE — ED Provider Notes (Signed)
[facility] EMERGENCY DEPARTMENT Provider Note   CSN: [id number] Arrival date & time: 08/23/18  1057     History   Chief Complaint Chief Complaint  Patient presents with  . Shortness of Breath    HPI Brittany Miranda is a 67 y.o. female.  Patient has history of COPD.  She started with cough and shortness of breath 4 days ago.  Patient states she is continued to be short of breath  The history is provided by the patient.  Shortness of Breath  This is a recurrent problem. The problem occurs continuously.The current episode started more than 2 days ago. The problem has not changed since onset.Associated symptoms include wheezing. Pertinent negatives include no fever, no headaches, no cough, no chest pain, no abdominal pain and no rash. It is unknown what precipitated the problem. Risk factors: COPD. She has tried ipratropium inhalers for the symptoms. She has had prior hospitalizations. Associated medical issues include COPD.    Past Medical History:  Diagnosis Date  . Anxiety   . Colon polyps    adenomas in remote past   . COPD (chronic obstructive pulmonary disease) ([place])   . Depression   . Osteopenia     Patient Active Problem List   Diagnosis Date Noted  . COPD with acute exacerbation ([place]) 08/23/2018  . Depression with anxiety 08/23/2018  . Colon polyps 04/05/2011    Past Surgical History:  Procedure Laterality Date  . APPENDECTOMY    . COLONOSCOPY  11/2005   Dr. Ala Bent diverticula  . COLONOSCOPY  2000   [facility]-->per patient colon polyps, adenomatous  . COLONOSCOPY N/A 07/08/2016   Procedure: COLONOSCOPY;  Surgeon: Daneil Dolin, MD;  [facility];  Service: Endoscopy;  Laterality: N/A;  145  . IRRIGATION AND DEBRIDEMENT OF WOUND WITH SPLIT THICKNESS SKIN GRAFT Left 08/13/2018   Procedure: IRRIGATION AND DEBRIDEMENT OF LEFT LEG WOUND WITH SPLIT THICKNESS SKIN GRAFT;  Surgeon: Cristine Polio, MD;  Location: [place];   Service: Plastics;  Laterality: Left;  . SKIN SPLIT GRAFT Left 08/13/2018   Procedure: SKIN GRAFT SPLIT THICKNESS;  Surgeon: Cristine Polio, MD;  Location: [place];  Service: Plastics;  Laterality: Left;     OB History   None      Home Medications    Prior to Admission medications   Medication Sig Start Date End Date Taking? Authorizing Provider  albuterol (PROVENTIL HFA;VENTOLIN HFA) 108 (90 Base) MCG/ACT inhaler Inhale 1 puff into the lungs every 6 (six) hours as needed. 05/08/18  Yes [provider]  ALPRAZolam Duanne Moron) 0.25 MG tablet Take 0.5 mg by mouth 3 (three) times daily as needed for anxiety.    Yes [provider]  BEVESPI AEROSPHERE 9-4.8 MCG/ACT AERO Inhale 2 puffs into the lungs daily.  07/14/18  Yes [provider]  Calcium Carbonate-Vitamin D (CALTRATE 600+D PO) Take 600 mg by mouth daily.   Yes [provider]  diphenhydrAMINE (BENADRYL) 25 mg capsule Take 25 mg by mouth at bedtime as needed for sleep.    Yes [provider]  doxycycline (VIBRA-TABS) 100 MG tablet Take 1 tablet by mouth 2 (two) times daily. 08/21/18  Yes [provider]  FLUoxetine (PROZAC) 20 MG capsule Take 20 mg by mouth daily.     Yes [provider]  ibuprofen (ADVIL,MOTRIN) 200 MG tablet Take 200 mg by mouth every 6 (six) hours as needed.     Yes [provider]  Omega-3  Fatty Acids (FISH OIL) 1000 MG CAPS Take 1,000 mg by mouth daily.   Yes [provider]  polycarbophil (FIBERCON) 625 MG tablet Take 1,250 mg by mouth daily.    Yes [provider]    Family History Family History  Problem Relation Age of Onset  . Kidney disease Mother   . Kidney disease Father   . Liver disease Neg Hx   . Colon cancer Neg Hx     Social History Social History   Tobacco Use  . Smoking status: Former Smoker    Last attempt to quit: 04/04/1998    Years since quitting: 20.4  . Smokeless tobacco: Never  Used  Substance Use Topics  . Alcohol use: No    Comment: socially  . Drug use: No     Allergies   Patient has no known allergies.   Review of Systems Review of Systems  Constitutional: Negative for appetite change, fatigue and fever.  HENT: Negative for congestion, ear discharge and sinus pressure.   Eyes: Negative for discharge.  Respiratory: Positive for shortness of breath and wheezing. Negative for cough.   Cardiovascular: Negative for chest pain.  Gastrointestinal: Negative for abdominal pain and diarrhea.  Genitourinary: Negative for frequency and hematuria.  Musculoskeletal: Negative for back pain.  Skin: Negative for rash.  Neurological: Negative for seizures and headaches.  Psychiatric/Behavioral: Negative for hallucinations.     Physical Exam Updated Vital Signs BP (!) 145/83   Pulse 94   Temp 97.8 F (36.6 C) (Oral)   Resp (!) 24   SpO2 (!) 86%   Physical Exam  Constitutional: She is oriented to person, place, and time. She appears well-developed.  HENT:  Head: Normocephalic.  Eyes: Conjunctivae and EOM are normal. No scleral icterus.  Neck: Neck supple. No thyromegaly present.  Cardiovascular: Normal rate and regular rhythm. Exam reveals no gallop and no friction rub.  No murmur heard. Pulmonary/Chest: No stridor. She has wheezes. She has no rales. She exhibits no tenderness.  Abdominal: She exhibits no distension. There is no tenderness. There is no rebound.  Musculoskeletal: Normal range of motion. She exhibits no edema.  Lymphadenopathy:    She has no cervical adenopathy.  Neurological: She is oriented to person, place, and time. She exhibits normal muscle tone. Coordination normal.  Skin: No rash noted. No erythema.  Psychiatric: She has a normal mood and affect. Her behavior is normal.     ED Treatments / Results  Labs (all labs ordered are listed, but only abnormal results are displayed) Labs Reviewed  CBC WITH DIFFERENTIAL/PLATELET -  Abnormal; Notable for the following components:      Result Value   MCV 105.4 (*)    All other components within normal limits  COMPREHENSIVE METABOLIC PANEL - Abnormal; Notable for the following components:   Sodium 134 (*)    BUN 7 (*)    All other components within normal limits    EKG EKG Interpretation  Date/Time:  Thursday August 23 2018 11:24:42 EDT Ventricular Rate:  82 PR Interval:    QRS Duration: 97 QT Interval:  383 QTC Calculation: 448 R Axis:   -55 Text Interpretation:  Sinus rhythm Short PR interval LAD, consider left anterior fascicular block Anterior infarct, old Confirmed by Milton Ferguson [phone]) on 08/23/2018 3:04:31 PM   Radiology Dg Chest 2 View  Result Date: 08/23/2018 CLINICAL DATA:  Acute shortness of breath and congestion. EXAM: CHEST - 2 VIEW COMPARISON:  05/10/2018 FINDINGS: The cardiomediastinal silhouette is unremarkable.  Emphysema noted. There is no evidence of focal airspace disease, pulmonary edema, suspicious pulmonary nodule/mass, pleural effusion, or pneumothorax. No acute bony abnormalities are identified. IMPRESSION: Emphysema without evidence of acute cardiopulmonary disease. Electronically Signed   By: Margarette Canada M.D.   On: 08/23/2018 12:51    Procedures Procedures (including critical care time)  Medications Ordered in ED Medications  methylPREDNISolone sodium succinate (SOLU-MEDROL) 125 mg/2 mL injection 125 mg (125 mg Intravenous Given 08/23/18 1149)  ipratropium-albuterol (DUONEB) 0.5-2.5 (3) MG/3ML nebulizer solution 3 mL (3 mLs Nebulization Given 08/23/18 1233)  albuterol (PROVENTIL) (2.5 MG/3ML) 0.083% nebulizer solution 2.5 mg (2.5 mg Nebulization Given 08/23/18 1233)  magnesium sulfate IVPB 2 g 50 mL (0 g Intravenous Stopped 08/23/18 1302)  ipratropium-albuterol (DUONEB) 0.5-2.5 (3) MG/3ML nebulizer solution 3 mL (3 mLs Nebulization Given 08/23/18 1402)  albuterol (PROVENTIL) (2.5 MG/3ML) 0.083% nebulizer solution 2.5 mg (2.5  mg Nebulization Given 08/23/18 1402)     Initial Impression / Assessment and Plan / ED Course  I have reviewed the triage vital signs and the nursing notes.  Pertinent labs & imaging results that were available during my care of the patient were reviewed by me and considered in my medical decision making (see chart for details). CRITICAL CARE Performed by: Milton Ferguson Total critical care time: 35 minutes Critical care time was exclusive of separately billable procedures and treating other patients. Critical care was necessary to treat or prevent imminent or life-threatening deterioration. Critical care was time spent personally by me on the following activities: development of treatment plan with patient and/or surrogate as well as nursing, discussions with consultants, evaluation of patient's response to treatment, examination of patient, obtaining history from patient or surrogate, ordering and performing treatments and interventions, ordering and review of laboratory studies, ordering and review of radiographic studies, pulse oximetry and re-evaluation of patient's condition.     Patient got steroids magnesium and neb treatments.  Her O2 sat was 90 on room air laying in the bed but when she ambulated her saturation level dropped down to 79%.  She will be admitted to medicine for COPD exacerbation  Final Clinical Impressions(s) / ED Diagnoses   Final diagnoses:  Shortness of breath  COPD exacerbation [facility])    ED Discharge Orders    None       Milton Ferguson, MD 08/23/18 1516

## 2018-08-23 NOTE — ED Notes (Signed)
Patient's brother and daughter are out-of-town.  If needed, contact [place] [phone] Cell    [phone]

## 2018-08-23 NOTE — H&P (Signed)
History and Physical    Brittany Miranda ZHY:[id number] DOB: 04/25/1951 DOA: 08/23/2018  PCP: Sharilyn Sites, MD   Patient coming from: Home  Chief Complaint: SOB, cough  HPI: Brittany Miranda is a 67 y.o. female with medical history significant for COPD, pression and anxiety, who presented to the ED with complaints of shortness of breath and cough productive of greenish sputum of 5 days duration.  She denies fever chills.  No chest pain, no personal history of blood clots. Quit smoking 20 years ago.  On home O2.  Patient saw her PCP 2 days ago was prescribed steroids and antibiotics, she did not take the steroids because her surgeon recommended not taking steroids unless absolutely necessary, to avoid delay in wound healing.  Patient had a recent dog bite-dog bit her multiple times, has now been put down.  Patient had severe injury from dog bite 07/26/18, with tendons exposed requiring skin grafting 08/13/18, by plastic/constructive surgery.  Surgical pathology showing acute inflammation and necrosis. Patient follow-up with her surgeon 2 days ago and was told wound looks good, not to disturb dressing, she has not had any problems with her wound, no drainage or pain.   ED Course: O2 sats down to 79% on room air with ambulation, improved with 2 to 3 L O2, mild intermittent tachypnea to 23, otherwise stable vitals.  WBC 5. Na- 134.  Otherwise unremarkable CBC BMP.  Two-view chest x-ray emphysema without acute cardiopulmonary disease.  Duonebs, 125 mg Solu-Medrol given in ED.  Review of Systems: As per HPI all other systems   Past Medical History:  Diagnosis Date  . Anxiety   . Colon polyps    adenomas in remote past   . COPD (chronic obstructive pulmonary disease) ([place])   . Depression   . Osteopenia     Past Surgical History:  Procedure Laterality Date  . APPENDECTOMY    . COLONOSCOPY  11/2005   Dr. Ala Bent diverticula  . COLONOSCOPY  2000   [facility]-->per patient colon polyps,  adenomatous  . COLONOSCOPY N/A 07/08/2016   Procedure: COLONOSCOPY;  Surgeon: Daneil Dolin, MD;  [facility];  Service: Endoscopy;  Laterality: N/A;  145  . IRRIGATION AND DEBRIDEMENT OF WOUND WITH SPLIT THICKNESS SKIN GRAFT Left 08/13/2018   Procedure: IRRIGATION AND DEBRIDEMENT OF LEFT LEG WOUND WITH SPLIT THICKNESS SKIN GRAFT;  Surgeon: Cristine Polio, MD;  Location: [place];  Service: Plastics;  Laterality: Left;  . SKIN SPLIT GRAFT Left 08/13/2018   Procedure: SKIN GRAFT SPLIT THICKNESS;  Surgeon: Cristine Polio, MD;  Location: [place];  Service: Plastics;  Laterality: Left;     reports that she quit smoking about 20 years ago. She has never used smokeless tobacco. She reports that she does not drink alcohol or use drugs.  No Known Allergies  Family History  Problem Relation Age of Onset  . Kidney disease Mother   . Kidney disease Father   . Liver disease Neg Hx   . Colon cancer Neg Hx     Prior to Admission medications   Medication Sig Start Date End Date Taking? Authorizing Provider  albuterol (PROVENTIL HFA;VENTOLIN HFA) 108 (90 Base) MCG/ACT inhaler Inhale 1 puff into the lungs every 6 (six) hours as needed. 05/08/18  Yes [provider]  ALPRAZolam Duanne Moron) 0.25 MG tablet Take 0.5 mg by mouth 3 (three) times daily as needed for anxiety.    Yes [provider]  BEVESPI AEROSPHERE 9-4.8 MCG/ACT  AERO Inhale 2 puffs into the lungs daily.  07/14/18  Yes [provider]  Calcium Carbonate-Vitamin D (CALTRATE 600+D PO) Take 600 mg by mouth daily.   Yes [provider]  diphenhydrAMINE (BENADRYL) 25 mg capsule Take 25 mg by mouth at bedtime as needed for sleep.    Yes [provider]  doxycycline (VIBRA-TABS) 100 MG tablet Take 1 tablet by mouth 2 (two) times daily. 08/21/18  Yes [provider]  FLUoxetine (PROZAC) 20 MG capsule Take 20 mg by mouth daily.     Yes [provider]  ibuprofen (ADVIL,MOTRIN) 200 MG tablet Take 200 mg by mouth every 6 (six) hours as needed.     Yes [provider]  Omega-3 Fatty Acids (FISH OIL) 1000 MG CAPS Take 1,000 mg by mouth daily.   Yes [provider]  polycarbophil (FIBERCON) 625 MG tablet Take 1,250 mg by mouth daily.    Yes [provider]    Physical Exam: Vitals:   08/23/18 1233 08/23/18 1330 08/23/18 1346 08/23/18 1347  BP:  (!) 145/83    Pulse:  92 95 94  Resp:  19  (!) 24  Temp:      TempSrc:      SpO2: 96% 90% (!) 80% (!) 86%    Constitutional: NAD, calm, comfortable Vitals:   08/23/18 1233 08/23/18 1330 08/23/18 1346 08/23/18 1347  BP:  (!) 145/83    Pulse:  92 95 94  Resp:  19  (!) 24  Temp:      TempSrc:      SpO2: 96% 90% (!) 80% (!) 86%   Eyes: PERRL, lids and conjunctivae normal ENMT: Mucous membranes are moist. Posterior pharynx clear of any exudate or lesions.Normal dentition.  Neck: normal, supple, no masses, no thyromegaly Respiratory: Diffusely decreased air entry bilaterally, with mild expiratory wheezing Normal respiratory effort. No accessory muscle use.  On O2 by nasal cannula. Cardiovascular: Regular rate and rhythm, no murmurs / rubs / gallops. No extremity edema. 2+ pedal pulses.  Abdomen: no tenderness, no masses palpated. No hepatosplenomegaly. Bowel sounds positive.  Musculoskeletal: no clubbing / cyanosis. No joint deformity upper and lower extremities. Good ROM, no contractures. Normal muscle tone.  Skin: Clean dressing left lower extremity, without surrounding erythema or tenderness no rashes, lesions, ulcers. No induration Neurologic: CN 2-12 grossly intact.  Strength 5/5 in all 4.  Psychiatric: Normal judgment and insight. Alert and oriented x 3. Normal mood.   Labs on Admission: I have personally reviewed following labs and imaging studies  CBC: Recent Labs  Lab 08/23/18 1151  WBC 5.0  NEUTROABS 3.3  HGB 13.2  HCT 41.0  MCV 105.4*   PLT 606   Basic Metabolic Panel: Recent Labs  Lab 08/23/18 1151  NA 134*  K 4.0  CL 98  CO2 25  GLUCOSE 96  BUN 7*  CREATININE 0.58  CALCIUM 8.9   Liver Function Tests: Recent Labs  Lab 08/23/18 1151  AST 33  ALT 20  ALKPHOS 77  BILITOT 0.8  PROT 7.0  ALBUMIN 3.6    Radiological Exams on Admission: Dg Chest 2 View  Result Date: 08/23/2018 CLINICAL DATA:  Acute shortness of breath and congestion. EXAM: CHEST - 2 VIEW COMPARISON:  05/10/2018 FINDINGS: The cardiomediastinal silhouette is unremarkable. Emphysema noted. There is no evidence of focal airspace disease, pulmonary edema, suspicious pulmonary nodule/mass, pleural effusion, or pneumothorax. No acute bony abnormalities are identified. IMPRESSION: Emphysema without evidence of acute cardiopulmonary disease. Electronically  Signed   By: Margarette Canada M.D.   On: 08/23/2018 12:51    EKG: Independently reviewed.  Sinus rhythm QTC 448.   Assessment/Plan Principal Problem:   COPD with acute exacerbation (HCC) Active Problems:   Depression with anxiety   COPD exacerbation-SOB, productive cough, wheezing, hypoxia with exertion.  Two-view chest x-ray, emphesematous changes otherwise clear.  WBC 5. -Continue IV Solu-Medrol 40 twice daily -Azithromycin -Nebs scheduled and PRN -Mucolytics, supplemental O2  Recent dog bite- 07/26/18 requiring skin grafting by reconstructive surgery 9/301/9.  Initial wound showed exposed tendons -see media photos.  No pain, surrounding redness or tenderness, clean dressings.  Followed with surgery 08/21/2018, next follow-up 1 week.  Depression with anxiety -Continue home Xanax 0.25 mg 3 times daily as needed -Cont Prozac  HIV as part of routine health screening   DVT prophylaxis: Heparin Code Status:Full Family Communication: friend at bedside Disposition Plan: 1-2 days Consults called: None Admission status: Obs, med surg   Bethena Roys MD [facility] Pager [phone] From 3PM-11PM.  Otherwise please contact night-coverage [email or web address] Password [email or web address]  08/23/2018, 4:45 PM

## 2018-08-23 NOTE — ED Triage Notes (Signed)
1 week ago pt had a skin graft done due to dog  Bite. Last Saturday, pt started coughing. Wet cough. Has a nebulizer and 2 inhalers. Was placed on 2 steroids, but has not taken the Prednisone. Surgeon told her not the prednisone bc it would stop the healing of her leg.  Is able to speak in complete sentences, but is having SOB while talking.

## 2018-08-23 NOTE — ED Notes (Signed)
Pt ambulated to toilet in room and back to bed, sats dropped to 80 on room air.  Placed o2 @ 2 liters via [place],  edp made aware

## 2018-08-24 DIAGNOSIS — F419 Anxiety disorder, unspecified: Secondary | ICD-10-CM | POA: Diagnosis not present

## 2018-08-24 DIAGNOSIS — J9601 Acute respiratory failure with hypoxia: Secondary | ICD-10-CM | POA: Diagnosis not present

## 2018-08-24 DIAGNOSIS — J441 Chronic obstructive pulmonary disease with (acute) exacerbation: Secondary | ICD-10-CM | POA: Diagnosis not present

## 2018-08-24 DIAGNOSIS — R69 Illness, unspecified: Secondary | ICD-10-CM | POA: Diagnosis not present

## 2018-08-24 DIAGNOSIS — F418 Other specified anxiety disorders: Secondary | ICD-10-CM | POA: Diagnosis present

## 2018-08-24 DIAGNOSIS — J9611 Chronic respiratory failure with hypoxia: Secondary | ICD-10-CM | POA: Diagnosis present

## 2018-08-24 DIAGNOSIS — Z7951 Long term (current) use of inhaled steroids: Secondary | ICD-10-CM | POA: Diagnosis not present

## 2018-08-24 DIAGNOSIS — Z87891 Personal history of nicotine dependence: Secondary | ICD-10-CM | POA: Diagnosis not present

## 2018-08-24 DIAGNOSIS — Z79899 Other long term (current) drug therapy: Secondary | ICD-10-CM | POA: Diagnosis not present

## 2018-08-24 DIAGNOSIS — Z23 Encounter for immunization: Secondary | ICD-10-CM | POA: Diagnosis not present

## 2018-08-24 DIAGNOSIS — R0602 Shortness of breath: Secondary | ICD-10-CM | POA: Diagnosis not present

## 2018-08-24 LAB — HIV ANTIBODY (ROUTINE TESTING W REFLEX): HIV SCREEN 4TH GENERATION: NONREACTIVE

## 2018-08-24 MED ORDER — BUDESONIDE 0.25 MG/2ML IN SUSP
0.2500 mg | Freq: Two times a day (BID) | RESPIRATORY_TRACT | Status: DC
  Administered 2018-08-24 – 2018-08-26 (×4): 0.25 mg via RESPIRATORY_TRACT
  Filled 2018-08-24 (×4): qty 2

## 2018-08-24 MED ORDER — METHYLPREDNISOLONE SODIUM SUCC 125 MG IJ SOLR
60.0000 mg | Freq: Two times a day (BID) | INTRAMUSCULAR | Status: DC
  Administered 2018-08-24 – 2018-08-25 (×2): 60 mg via INTRAVENOUS
  Filled 2018-08-24 (×2): qty 2

## 2018-08-24 MED ORDER — GUAIFENESIN-DM 100-10 MG/5ML PO SYRP: 10.0000 mL | ORAL_SOLUTION | ORAL | Status: DC | PRN

## 2018-08-24 NOTE — Progress Notes (Signed)
PROGRESS NOTE    Brittany Miranda  NOM:[id number] DOB: 1951/04/27 DOA: 08/23/2018 PCP: Sharilyn Sites, MD    Brief Narrative:  67 year old female with a history of COPD, anxiety, admitted to the hospital with worsening shortness of breath and cough.  Found to have COPD exacerbation.  Chest x-ray did not show any evidence of pneumonia.  Admitted for intravenous steroids, bronchodilators and antibiotics.   Assessment & Plan:   Principal Problem:   COPD with acute exacerbation ([place]) Active Problems:   Depression with anxiety   Anxiety   Acute respiratory failure with hypoxia ([place])   1. Acute respiratory failure with hypoxia.  Patient was found to have oxygen saturation of 86% on room air on admission.  She is currently on supplemental oxygen.  We will try to wean off as tolerated. 2. COPD exacerbation.  Continues to have wheezing and shortness of breath.  Continue on intravenous steroids.  We will also add inhaled steroids.  Continue bronchodilators, antibiotics and pulmonary hygiene. 3. Anxiety.  Continue on Xanax as needed. 4. Recent left leg dog bite status post skin grafting.  Follow-up with plastic surgery as scheduled.   DVT prophylaxis: Heparin Code Status: Full code Family Communication: No family present Disposition Plan: Discharge home once improved   Consultants:     Procedures:     Antimicrobials:   Azithromycin 10/10 >   Subjective: Still feels short of breath, wheezing, coughing.  Not back to baseline yet.  Objective: Vitals:   08/24/18 0601 08/24/18 0752 08/24/18 1357 08/24/18 1424  BP: 123/84  128/84   Pulse: 85  85   Resp: 18  20   Temp: 98.1 F (36.7 C)     TempSrc: Oral     SpO2: 94% 94% 93% 92%  Weight:      Height:        Intake/Output Summary (Last 24 hours) at 08/24/2018 1646 Last data filed at 08/24/2018 0900 Gross per 24 hour  Intake 720 ml  Output -  Net 720 ml   Filed Weights   08/23/18 1722  Weight: 44.4 kg     Examination:  General exam: Appears calm and comfortable  Respiratory system: Bilateral wheezing with conversational dyspnea Cardiovascular system: S1 & S2 heard, RRR. No JVD, murmurs, rubs, gallops or clicks. No pedal edema. Gastrointestinal system: Abdomen is nondistended, soft and nontender. No organomegaly or masses felt. Normal bowel sounds heard. Central nervous system: Alert and oriented. No focal neurological deficits. Extremities: Symmetric 5 x 5 power. Skin: No rashes, lesions or ulcers Psychiatry: Judgement and insight appear normal. Mood & affect appropriate.     Data Reviewed: I have personally reviewed following labs and imaging studies  CBC: Recent Labs  Lab 08/23/18 1151  WBC 5.0  NEUTROABS 3.3  HGB 13.2  HCT 41.0  MCV 105.4*  PLT 962   Basic Metabolic Panel: Recent Labs  Lab 08/23/18 1151  NA 134*  K 4.0  CL 98  CO2 25  GLUCOSE 96  BUN 7*  CREATININE 0.58  CALCIUM 8.9   GFR: Estimated Creatinine Clearance: 46.5 mL/min (by C-G formula based on SCr of 0.58 mg/dL). Liver Function Tests: Recent Labs  Lab 08/23/18 1151  AST 33  ALT 20  ALKPHOS 77  BILITOT 0.8  PROT 7.0  ALBUMIN 3.6   No results for input(s): LIPASE, AMYLASE in the last 168 hours. No results for input(s): AMMONIA in the last 168 hours. Coagulation Profile: No results for input(s): INR, PROTIME in the last 168 hours.  Cardiac Enzymes: No results for input(s): CKTOTAL, CKMB, CKMBINDEX, TROPONINI in the last 168 hours. BNP (last 3 results) No results for input(s): PROBNP in the last 8760 hours. HbA1C: No results for input(s): HGBA1C in the last 72 hours. CBG: No results for input(s): GLUCAP in the last 168 hours. Lipid Profile: No results for input(s): CHOL, HDL, LDLCALC, TRIG, CHOLHDL, LDLDIRECT in the last 72 hours. Thyroid Function Tests: No results for input(s): TSH, T4TOTAL, FREET4, T3FREE, THYROIDAB in the last 72 hours. Anemia Panel: No results for input(s):  VITAMINB12, FOLATE, FERRITIN, TIBC, IRON, RETICCTPCT in the last 72 hours. Sepsis Labs: No results for input(s): PROCALCITON, LATICACIDVEN in the last 168 hours.  No results found for this or any previous visit (from the past 240 hour(s)).       Radiology Studies: Dg Chest 2 View  Result Date: 08/23/2018 CLINICAL DATA:  Acute shortness of breath and congestion. EXAM: CHEST - 2 VIEW COMPARISON:  05/10/2018 FINDINGS: The cardiomediastinal silhouette is unremarkable. Emphysema noted. There is no evidence of focal airspace disease, pulmonary edema, suspicious pulmonary nodule/mass, pleural effusion, or pneumothorax. No acute bony abnormalities are identified. IMPRESSION: Emphysema without evidence of acute cardiopulmonary disease. Electronically Signed   By: Margarette Canada M.D.   On: 08/23/2018 12:51        Scheduled Meds: . azithromycin  500 mg Oral Daily  . FLUoxetine  20 mg Oral Daily  . guaiFENesin  600 mg Oral BID  . heparin  5,000 Units Subcutaneous Q8H  . ipratropium-albuterol  3 mL Nebulization Q6H  . mouth rinse  15 mL Mouth Rinse BID  . methylPREDNISolone (SOLU-MEDROL) injection  60 mg Intravenous Q12H  . polycarbophil  1,250 mg Oral Daily   Continuous Infusions:   LOS: 0 days    Time spent: 74mins    Kathie Dike, MD [facility] Pager [phone]  If 7PM-7AM, please contact night-coverage [email or web address] Password [facility] 08/24/2018, 4:46 PM

## 2018-08-25 MED ORDER — METHYLPREDNISOLONE SODIUM SUCC 125 MG IJ SOLR
60.0000 mg | Freq: Four times a day (QID) | INTRAMUSCULAR | Status: DC
  Administered 2018-08-25 – 2018-08-26 (×4): 60 mg via INTRAVENOUS
  Filled 2018-08-25 (×4): qty 2

## 2018-08-25 NOTE — Progress Notes (Signed)
PROGRESS NOTE    Brittany Miranda  BOF:[id number] DOB: 03-Feb-1951 DOA: 08/23/2018 PCP: Sharilyn Sites, MD    Brief Narrative:  67 year old female with a history of COPD, anxiety, admitted to the hospital with worsening shortness of breath and cough.  Found to have COPD exacerbation.  Chest x-ray did not show any evidence of pneumonia.  Admitted for intravenous steroids, bronchodilators and antibiotics.   Assessment & Plan:   Principal Problem:   COPD with acute exacerbation ([place]) Active Problems:   Depression with anxiety   Anxiety   Acute respiratory failure with hypoxia ([place])   1. Acute respiratory failure with hypoxia.  Patient was found to have oxygen saturation of 86% on room air on admission.  She is currently on supplemental oxygen.  We will try to wean off as tolerated. 2. COPD exacerbation. Still very short of breath and wheezing. Will increase steroids to q6h. Add flutter valve. Continue nebs and antibiotics. 3. Anxiety.  Continue on Xanax as needed. 4. Recent left leg dog bite status post skin grafting.  Follow-up with plastic surgery as scheduled.   DVT prophylaxis: Heparin Code Status: Full code Family Communication: No family present Disposition Plan: Discharge home once improved   Consultants:     Procedures:     Antimicrobials:   Azithromycin 10/10 >   Subjective: Still short of breath. Having difficulty ambulating due to shortness of breath  Objective: Vitals:   08/25/18 0535 08/25/18 0945 08/25/18 1352 08/25/18 1415  BP: 132/83   124/85  Pulse: 84   94  Resp: 16   16  Temp: 97.8 F (36.6 C)   98.2 F (36.8 C)  TempSrc: Oral   Oral  SpO2: 91% 94% 95% 91%  Weight:      Height:        Intake/Output Summary (Last 24 hours) at 08/25/2018 1553 Last data filed at 08/25/2018 1100 Gross per 24 hour  Intake 840 ml  Output -  Net 840 ml   Filed Weights   08/23/18 1722  Weight: 44.4 kg    Examination:  General exam: Alert, awake,  oriented x 3 Respiratory system: bilateral wheezing and mild increased respiratory effort. Cardiovascular system:RRR. No murmurs, rubs, gallops. Gastrointestinal system: Abdomen is nondistended, soft and nontender. No organomegaly or masses felt. Normal bowel sounds heard. Central nervous system: Alert and oriented. No focal neurological deficits. Extremities: No C/C/E, +pedal pulses Skin: No rashes, lesions or ulcers Psychiatry: Judgement and insight appear normal. Mood & affect appropriate.    Data Reviewed: I have personally reviewed following labs and imaging studies  CBC: Recent Labs  Lab 08/23/18 1151  WBC 5.0  NEUTROABS 3.3  HGB 13.2  HCT 41.0  MCV 105.4*  PLT 778   Basic Metabolic Panel: Recent Labs  Lab 08/23/18 1151  NA 134*  K 4.0  CL 98  CO2 25  GLUCOSE 96  BUN 7*  CREATININE 0.58  CALCIUM 8.9   GFR: Estimated Creatinine Clearance: 46.5 mL/min (by C-G formula based on SCr of 0.58 mg/dL). Liver Function Tests: Recent Labs  Lab 08/23/18 1151  AST 33  ALT 20  ALKPHOS 77  BILITOT 0.8  PROT 7.0  ALBUMIN 3.6   No results for input(s): LIPASE, AMYLASE in the last 168 hours. No results for input(s): AMMONIA in the last 168 hours. Coagulation Profile: No results for input(s): INR, PROTIME in the last 168 hours. Cardiac Enzymes: No results for input(s): CKTOTAL, CKMB, CKMBINDEX, TROPONINI in the last 168 hours. BNP (last 3  results) No results for input(s): PROBNP in the last 8760 hours. HbA1C: No results for input(s): HGBA1C in the last 72 hours. CBG: No results for input(s): GLUCAP in the last 168 hours. Lipid Profile: No results for input(s): CHOL, HDL, LDLCALC, TRIG, CHOLHDL, LDLDIRECT in the last 72 hours. Thyroid Function Tests: No results for input(s): TSH, T4TOTAL, FREET4, T3FREE, THYROIDAB in the last 72 hours. Anemia Panel: No results for input(s): VITAMINB12, FOLATE, FERRITIN, TIBC, IRON, RETICCTPCT in the last 72 hours. Sepsis Labs: No  results for input(s): PROCALCITON, LATICACIDVEN in the last 168 hours.  No results found for this or any previous visit (from the past 240 hour(s)).       Radiology Studies: No results found.      Scheduled Meds: . azithromycin  500 mg Oral Daily  . budesonide (PULMICORT) nebulizer solution  0.25 mg Nebulization BID  . FLUoxetine  20 mg Oral Daily  . guaiFENesin  600 mg Oral BID  . heparin  5,000 Units Subcutaneous Q8H  . ipratropium-albuterol  3 mL Nebulization Q6H  . mouth rinse  15 mL Mouth Rinse BID  . methylPREDNISolone (SOLU-MEDROL) injection  60 mg Intravenous Q6H  . polycarbophil  1,250 mg Oral Daily   Continuous Infusions:   LOS: 1 day    Time spent: 14mins    Kathie Dike, MD [facility] Pager [phone]  If 7PM-7AM, please contact night-coverage [email or web address] Password [facility] 08/25/2018, 3:53 PM

## 2018-08-26 DIAGNOSIS — F419 Anxiety disorder, unspecified: Secondary | ICD-10-CM

## 2018-08-26 DIAGNOSIS — J441 Chronic obstructive pulmonary disease with (acute) exacerbation: Principal | ICD-10-CM

## 2018-08-26 DIAGNOSIS — J9601 Acute respiratory failure with hypoxia: Secondary | ICD-10-CM

## 2018-08-26 DIAGNOSIS — F418 Other specified anxiety disorders: Secondary | ICD-10-CM

## 2018-08-26 MED ORDER — ALBUTEROL SULFATE (2.5 MG/3ML) 0.083% IN NEBU: 2.5000 mg | INHALATION_SOLUTION | RESPIRATORY_TRACT | 12 refills | Status: DC | PRN

## 2018-08-26 MED ORDER — PREDNISONE 20 MG PO TABS: ORAL_TABLET | ORAL | 0 refills | Status: DC

## 2018-08-26 MED ORDER — DOXYCYCLINE HYCLATE 100 MG PO TABS: 100.0000 mg | ORAL_TABLET | Freq: Two times a day (BID) | ORAL | 0 refills | Status: AC

## 2018-08-26 MED ORDER — INFLUENZA VAC SPLIT HIGH-DOSE 0.5 ML IM SUSY
0.5000 mL | PREFILLED_SYRINGE | INTRAMUSCULAR | Status: AC
  Administered 2018-08-26: 0.5 mL via INTRAMUSCULAR
  Filled 2018-08-26: qty 0.5

## 2018-08-26 NOTE — Care Management Note (Signed)
Case Management Note  Patient Details  Name: Brittany Miranda MRN: [id number] Date of Birth: [birth year]  Subjective/Objective:                    Action/Plan:  Spoke to patient on the phone. Discussed home oxygen need. She is agreeable to use [facility]. Referral called to clinical liaison Brad. Informed patient delivery time will be around 2 hours.  Spoke w bedside RN she is aware that referral has been placed and O2 will be delivered to room prior to DC. No other CM needs. Signing off.  Expected Discharge Date:  08/26/18               Expected Discharge Plan:  Home/Self Care  In-House Referral:     Discharge planning Services  CM Consult  Post Acute Care Choice:  Durable Medical Equipment Choice offered to:     DME Arranged:  Oxygen DME Agency:  [place]:    [facility] Agency:     Status of Service:  Completed, signed off  If discussed at [place] of Stay Meetings, dates discussed:    Additional Comments:  Carles Collet, RN 08/26/2018, 11:08 AM

## 2018-08-26 NOTE — Progress Notes (Signed)
On assessment the patient endorsees being attacked and bitten by a dog exactly one month ago. She is currently being treated by a Psychologist, sport and exercise in [place]. She admits having a donor site wound to left upper thigh and recipient graft site (dog bite ) site to lower left leg. 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  She is refusing me access to assess  the wound at this time . She has a f/u with her surgeon on Tuesday and states he has informed her to not have anyone disturb the wound in fear of impeding healing progress . I will comply with the patient wishes at this time

## 2018-08-26 NOTE — Discharge Instructions (Signed)
Seek medical care or return to ER if symptoms come back, worsen or new problem develops.    Chronic Obstructive Pulmonary Disease (Emphysema) Chronic obstructive pulmonary disease (COPD) is a long-term (chronic) lung problem. When you have COPD, it is hard for air to get in and out of your lungs. The way your lungs work will never return to normal. Usually the condition gets worse over time. There are things you can do to keep yourself as healthy as possible. Your doctor may treat your condition with:  Medicines.  Quitting smoking, if you smoke.  Rehabilitation. This may involve a team of specialists.  Oxygen.  Exercise and changes to your diet.  Lung surgery.  Comfort measures (palliative care).  Follow these instructions at home: Medicines  Take over-the-counter and prescription medicines only as told by your doctor.  Talk to your doctor before taking any cough or allergy medicines. You may need to avoid medicines that cause your lungs to be dry. Lifestyle  If you smoke, stop. Smoking makes the problem worse. If you need help quitting, ask your doctor.  Avoid being around things that make your breathing worse. This may include smoke, chemicals, and fumes.  Stay active, but remember to also rest.  Learn and use tips on how to relax.  Make sure you get enough sleep. Most adults need at least 7 hours a night.  Eat healthy foods. Eat smaller meals more often. Rest before meals. Controlled breathing  Learn and use tips on how to control your breathing as told by your doctor. Try: ? Breathing in (inhaling) through your nose for 1 second. Then, pucker your lips and breath out (exhale) through your lips for 2 seconds. ? Putting one hand on your belly (abdomen). Breathe in slowly through your nose for 1 second. Your hand on your belly should move out. Pucker your lips and breathe out slowly through your lips. Your hand on your belly should move in as you breathe out. Controlled  coughing  Learn and use controlled coughing to clear mucus from your lungs. The steps are: 1. Lean your head a little forward. 2. Breathe in deeply. 3. Try to hold your breath for 3 seconds. 4. Keep your mouth slightly open while coughing 2 times. 5. Spit any mucus out into a tissue. 6. Rest and do the steps again 1 or 2 times as needed. General instructions  Make sure you get all the shots (vaccines) that your doctor recommends. Ask your doctor about a flu shot and a pneumonia shot.  Use oxygen therapy and therapy to help improve your lungs (pulmonary rehabilitation) if told by your doctor. If you need home oxygen therapy, ask your doctor if you should buy a tool to measure your oxygen level (oximeter).  Make a COPD action plan with your doctor. This helps you know what to do if you feel worse than usual.  Manage any other conditions you have as told by your doctor.  Avoid going outside when it is very hot, cold, or humid.  Avoid people who have a sickness you can catch (contagious).  Keep all follow-up visits as told by your doctor. This is important. Contact a doctor if:  You cough up more mucus than usual.  There is a change in the color or thickness of the mucus.  It is harder to breathe than usual.  Your breathing is faster than usual.  You have trouble sleeping.  You need to use your medicines more often than usual.  You  have trouble doing your normal activities such as getting dressed or walking around the house. Get help right away if:  You have shortness of breath while resting.  You have shortness of breath that stops you from: ? Being able to talk. ? Doing normal activities.  Your chest hurts for longer than 5 minutes.  Your skin color is more blue than usual.  Your pulse oximeter shows that you have low oxygen for longer than 5 minutes.  You have a fever.  You feel too tired to breathe normally. Summary  Chronic obstructive pulmonary disease  (COPD) is a long-term lung problem.  The way your lungs work will never return to normal. Usually the condition gets worse over time. There are things you can do to keep yourself as healthy as possible.  Take over-the-counter and prescription medicines only as told by your doctor.  If you smoke, stop. Smoking makes the problem worse. This information is not intended to replace advice given to you by your health care provider. Make sure you discuss any questions you have with your health care provider. Document Released: 04/18/2008 Document Revised: 04/07/2016 Document Reviewed: 06/27/2013 Elsevier Interactive Patient Education  2017 Elsevier Inc.   Chronic Obstructive Pulmonary Disease Exacerbation Chronic obstructive pulmonary disease (COPD) is a common lung problem. In COPD, the flow of air from the lungs is limited. COPD exacerbations are times that breathing gets worse and you need extra treatment. Without treatment they can be life threatening. If they happen often, your lungs can become more damaged. If your COPD gets worse, your doctor may treat you with:  Medicines.  Oxygen.  Different ways to clear your airway, such as using a mask.  Follow these instructions at home:  Do not smoke.  Avoid tobacco smoke and other things that bother your lungs.  If given, take your antibiotic medicine as told. Finish the medicine even if you start to feel better.  Only take medicines as told by your doctor.  Drink enough fluids to keep your pee (urine) clear or pale yellow (unless your doctor has told you not to).  Use a cool mist machine (vaporizer).  If you use oxygen or a machine that turns liquid medicine into a mist (nebulizer), continue to use them as told.  Keep up with shots (vaccinations) as told by your doctor.  Exercise regularly.  Eat healthy foods.  Keep all doctor visits as told. Get help right away if:  You are very short of breath and it gets worse.  You have  trouble talking.  You have bad chest pain.  You have blood in your spit (sputum).  You have a fever.  You keep throwing up (vomiting).  You feel weak, or you pass out (faint).  You feel confused.  You keep getting worse. This information is not intended to replace advice given to you by your health care provider. Make sure you discuss any questions you have with your health care provider. Document Released: 10/20/2011 Document Revised: 04/07/2016 Document Reviewed: 07/05/2013 Elsevier Interactive Patient Education  2017 [place].   Follow with Primary MD  Sharilyn Sites, MD  and other consultant's as instructed your Hospitalist MD  Please get a complete blood count and chemistry panel checked by your Primary MD at your next visit, and again as instructed by your Primary MD.  Get Medicines reviewed and adjusted: Please take all your medications with you for your next visit with your Primary MD  Laboratory/radiological data: Please request your Primary MD  to go over all hospital tests and procedure/radiological results at the follow up, please ask your Primary MD to get all Hospital records sent to his/her office.  In some cases, they will be blood work, cultures and biopsy results pending at the time of your discharge. Please request that your primary care M.D. follows up on these results.  Also Note the following: If you experience worsening of your admission symptoms, develop shortness of breath, life threatening emergency, suicidal or homicidal thoughts you must seek medical attention immediately by calling 911 or calling your MD immediately  if symptoms less severe.  You must read complete instructions/literature along with all the possible adverse reactions/side effects for all the Medicines you take and that have been prescribed to you. Take any new Medicines after you have completely understood and accpet all the possible adverse reactions/side effects.   Do not drive  when taking Pain medications or sleeping medications (Benzodaizepines)  Do not take more than prescribed Pain, Sleep and Anxiety Medications. It is not advisable to combine anxiety,sleep and pain medications without talking with your primary care practitioner  Special Instructions: If you have smoked or chewed Tobacco  in the last 2 yrs please stop smoking, stop any regular Alcohol  and or any Recreational drug use.  Wear Seat belts while driving.  Please note: You were cared for by a hospitalist during your hospital stay. Once you are discharged, your primary care physician will handle any further medical issues. Please note that NO REFILLS for any discharge medications will be authorized once you are discharged, as it is imperative that you return to your primary care physician (or establish a relationship with a primary care physician if you do not have one) for your post hospital discharge needs so that they can reassess your need for medications and monitor your lab values.

## 2018-08-26 NOTE — Progress Notes (Signed)
Tawny Hopping discharged Home per MD order.  Discharge instructions reviewed and discussed with the patient, all questions and concerns answered. Copy of instructions and scripts given to patient. Oxygen delivered to room by [facility].  Allergies as of 08/26/2018   No Known Allergies     Medication List    STOP taking these medications   ibuprofen 200 MG tablet Commonly known as:  ADVIL,MOTRIN     TAKE these medications   albuterol 108 (90 Base) MCG/ACT inhaler Commonly known as:  PROVENTIL HFA;VENTOLIN HFA Inhale 1 puff into the lungs every 6 (six) hours as needed. What changed:  Another medication with the same name was added. Make sure you understand how and when to take each.   albuterol (2.5 MG/3ML) 0.083% nebulizer solution Commonly known as:  PROVENTIL Take 3 mLs (2.5 mg total) by nebulization every 4 (four) hours as needed for wheezing or shortness of breath. What changed:  You were already taking a medication with the same name, and this prescription was added. Make sure you understand how and when to take each.   ALPRAZolam 0.25 MG tablet Commonly known as:  XANAX Take 0.5 mg by mouth 3 (three) times daily as needed for anxiety.   BEVESPI AEROSPHERE 9-4.8 MCG/ACT Aero Generic drug:  Glycopyrrolate-Formoterol Inhale 2 puffs into the lungs daily.   CALTRATE 600+D PO Take 600 mg by mouth daily.   diphenhydrAMINE 25 mg capsule Commonly known as:  BENADRYL Take 25 mg by mouth at bedtime as needed for sleep.   doxycycline 100 MG tablet Commonly known as:  VIBRA-TABS Take 1 tablet (100 mg total) by mouth 2 (two) times daily for 7 days.   Fish Oil 1000 MG Caps Take 1,000 mg by mouth daily.   FLUoxetine 20 MG capsule Commonly known as:  PROZAC Take 20 mg by mouth daily.   polycarbophil 625 MG tablet Commonly known as:  FIBERCON Take 1,250 mg by mouth daily.   predniSONE 20 MG tablet Commonly known as:  DELTASONE Take 3 PO QAM x5days, 2 PO QAM x5days, 1 PO QAM  x5days Start taking on:  08/27/2018            Durable Medical Equipment  (From admission, onward)         Start     Ordered   08/26/18 1100  For home use only DME oxygen  Once    Question Answer Comment  Mode or (Route) Nasal cannula   Liters per Minute 2   Frequency Continuous (stationary and portable oxygen unit needed)   Oxygen conserving device Yes   Oxygen delivery system Gas      08/26/18 1059          IV site discontinued and catheter remains intact. Site without signs and symptoms of complications. Dressing and pressure applied.  Patient escorted to car in a wheelchair,  no distress noted upon discharge.  Ralene Muskrat Norrin Shreffler 08/26/2018 3:24 PM

## 2018-08-26 NOTE — Discharge Summary (Addendum)
Physician Discharge Summary  [name] VEH:[id number] DOB: 02-12-1951 DOA: 08/23/2018  PCP: Sharilyn Sites, MD  Admit date: 08/23/2018 Discharge date: 08/26/2018  Admitted From: Home  Disposition: Home   Recommendations for Outpatient Follow-up:  1. Follow up with PCP in 1 weeks 2. Establish care with pulmonologist Dr. Luan Pulling in 2-3 weeks.  Discharge Condition: STABLE   CODE STATUS: FULL    Brief Hospitalization Summary: Please see all hospital notes, images, labs for full details of the hospitalization. HPI: Brittany Miranda is a 67 y.o. female with medical history significant for COPD, pression and anxiety, who presented to the ED with complaints of shortness of breath and cough productive of greenish sputum of 5 days duration.  She denies fever chills.  No chest pain, no personal history of blood clots. Quit smoking 20 years ago.  On home O2.  Patient saw her PCP 2 days ago was prescribed steroids and antibiotics, she did not take the steroids because her surgeon recommended not taking steroids unless absolutely necessary, to avoid delay in wound healing.  Patient had a recent dog bite-dog bit her multiple times, has now been put down.  Patient had severe injury from dog bite 07/26/18, with tendons exposed requiring skin grafting 08/13/18, by plastic/constructive surgery.  Surgical pathology showing acute inflammation and necrosis. Patient follow-up with her surgeon 2 days ago and was told wound looks good, not to disturb dressing, she has not had any problems with her wound, no drainage or pain.   ED Course: O2 sats down to 79% on room air with ambulation, improved with 2 to 3 L O2, mild intermittent tachypnea to 23, otherwise stable vitals.  WBC 5. Na- 134.  Otherwise unremarkable CBC BMP.  Two-view chest x-ray emphysema without acute cardiopulmonary disease.  Duonebs, 125 mg Solu-Medrol given in ED.  67 year old female with a history of COPD, anxiety, admitted to the hospital with  worsening shortness of breath and cough.  Found to have COPD exacerbation.  Chest x-ray did not show any evidence of pneumonia.  Admitted for intravenous steroids, bronchodilators and antibiotics.   Assessment & Plan:   Principal Problem:   COPD with acute exacerbation ([place]) Active Problems:   Depression with anxiety   Anxiety   Acute respiratory failure with hypoxia ([place])   1. Acute respiratory failure with hypoxia.  Patient is clinically much better and wanting to go home.  RN to test patient to see if she will require home oxygen at discharge. Will discharge home today with outpatient follow up recommended with PCP and establish care with local pulmonologist.  Pt does qualify for home oxygen and will discharge on home oxygen.  Pt desats to 74% when ambulating.  Care manager has been consulted.   2. COPD exacerbation. Pt says she is feeling much better today and wants to go home.   Continue steroids, nebs and antibiotics.   3. Anxiety.  Continue on Xanax as needed. 4. Recent left leg dog bite status post skin grafting.  Follow-up with plastic surgery as scheduled.   DVT prophylaxis: Heparin Code Status: Full code Family Communication: Pt updated at bedside.  Disposition Plan: Discharge home  Discharge Diagnoses:  Principal Problem:   COPD with acute exacerbation ([place]) Active Problems:   Depression with anxiety   Anxiety   Acute respiratory failure with hypoxia [facility])  Discharge Instructions: Discharge Instructions    Call MD for:  difficulty breathing, headache or visual disturbances   Complete by:  As directed    Call  MD for:  extreme fatigue   Complete by:  As directed    Call MD for:  persistant dizziness or light-headedness   Complete by:  As directed    Increase activity slowly   Complete by:  As directed      Allergies as of 08/26/2018   No Known Allergies     Medication List    STOP taking these medications   ibuprofen 200 MG tablet Commonly known  as:  ADVIL,MOTRIN     TAKE these medications   albuterol 108 (90 Base) MCG/ACT inhaler Commonly known as:  PROVENTIL HFA;VENTOLIN HFA Inhale 1 puff into the lungs every 6 (six) hours as needed. What changed:  Another medication with the same name was added. Make sure you understand how and when to take each.   albuterol (2.5 MG/3ML) 0.083% nebulizer solution Commonly known as:  PROVENTIL Take 3 mLs (2.5 mg total) by nebulization every 4 (four) hours as needed for wheezing or shortness of breath. What changed:  You were already taking a medication with the same name, and this prescription was added. Make sure you understand how and when to take each.   ALPRAZolam 0.25 MG tablet Commonly known as:  XANAX Take 0.5 mg by mouth 3 (three) times daily as needed for anxiety.   BEVESPI AEROSPHERE 9-4.8 MCG/ACT Aero Generic drug:  Glycopyrrolate-Formoterol Inhale 2 puffs into the lungs daily.   CALTRATE 600+D PO Take 600 mg by mouth daily.   diphenhydrAMINE 25 mg capsule Commonly known as:  BENADRYL Take 25 mg by mouth at bedtime as needed for sleep.   doxycycline 100 MG tablet Commonly known as:  VIBRA-TABS Take 1 tablet (100 mg total) by mouth 2 (two) times daily for 7 days.   Fish Oil 1000 MG Caps Take 1,000 mg by mouth daily.   FLUoxetine 20 MG capsule Commonly known as:  PROZAC Take 20 mg by mouth daily.   polycarbophil 625 MG tablet Commonly known as:  FIBERCON Take 1,250 mg by mouth daily.   predniSONE 20 MG tablet Commonly known as:  DELTASONE Take 3 PO QAM x5days, 2 PO QAM x5days, 1 PO QAM x5days Start taking on:  08/27/2018      Follow-up Information    Sharilyn Sites, MD. Schedule an appointment as soon as possible for a visit in 4 day(s).   Specialty:  Family Medicine Why:  Hospital Follow Up  Contact information: [address] [place] [address] [phone]        Sinda Du, MD. Schedule an appointment as soon as possible for a visit in  2 week(s).   Specialty:  Pulmonary Disease Why:  Establish Care for lung disease Contact information: [place] [address] [phone]          No Known Allergies Allergies as of 08/26/2018   No Known Allergies     Medication List    STOP taking these medications   ibuprofen 200 MG tablet Commonly known as:  ADVIL,MOTRIN     TAKE these medications   albuterol 108 (90 Base) MCG/ACT inhaler Commonly known as:  PROVENTIL HFA;VENTOLIN HFA Inhale 1 puff into the lungs every 6 (six) hours as needed. What changed:  Another medication with the same name was added. Make sure you understand how and when to take each.   albuterol (2.5 MG/3ML) 0.083% nebulizer solution Commonly known as:  PROVENTIL Take 3 mLs (2.5 mg total) by nebulization every 4 (four) hours as needed for wheezing  or shortness of breath. What changed:  You were already taking a medication with the same name, and this prescription was added. Make sure you understand how and when to take each.   ALPRAZolam 0.25 MG tablet Commonly known as:  XANAX Take 0.5 mg by mouth 3 (three) times daily as needed for anxiety.   BEVESPI AEROSPHERE 9-4.8 MCG/ACT Aero Generic drug:  Glycopyrrolate-Formoterol Inhale 2 puffs into the lungs daily.   CALTRATE 600+D PO Take 600 mg by mouth daily.   diphenhydrAMINE 25 mg capsule Commonly known as:  BENADRYL Take 25 mg by mouth at bedtime as needed for sleep.   doxycycline 100 MG tablet Commonly known as:  VIBRA-TABS Take 1 tablet (100 mg total) by mouth 2 (two) times daily for 7 days.   Fish Oil 1000 MG Caps Take 1,000 mg by mouth daily.   FLUoxetine 20 MG capsule Commonly known as:  PROZAC Take 20 mg by mouth daily.   polycarbophil 625 MG tablet Commonly known as:  FIBERCON Take 1,250 mg by mouth daily.   predniSONE 20 MG tablet Commonly known as:  DELTASONE Take 3 PO QAM x5days, 2 PO QAM x5days, 1 PO QAM x5days Start taking on:   08/27/2018       Procedures/Studies: Dg Chest 2 View  Result Date: 08/23/2018 CLINICAL DATA:  Acute shortness of breath and congestion. EXAM: CHEST - 2 VIEW COMPARISON:  05/10/2018 FINDINGS: The cardiomediastinal silhouette is unremarkable. Emphysema noted. There is no evidence of focal airspace disease, pulmonary edema, suspicious pulmonary nodule/mass, pleural effusion, or pneumothorax. No acute bony abnormalities are identified. IMPRESSION: Emphysema without evidence of acute cardiopulmonary disease. Electronically Signed   By: Margarette Canada M.D.   On: 08/23/2018 12:51      Subjective: Pt says that she is feeling a lot better today and she really wants to go home.  No SOB.  No CP.  No fever or chills.   Discharge Exam: Vitals:   08/26/18 0557 08/26/18 0721  BP: 136/84   Pulse: 82   Resp: 20   Temp: 97.6 F (36.4 C)   SpO2: 93% 90%   Vitals:   08/25/18 2254 08/26/18 0011 08/26/18 0557 08/26/18 0721  BP: 134/85  136/84   Pulse: 89  82   Resp:   20   Temp: 98.1 F (36.7 C)  97.6 F (36.4 C)   TempSrc: Oral  Oral   SpO2: 95% 92% 93% 90%  Weight:      Height:       General: Pt is alert, awake, not in acute distress Cardiovascular: RRR, S1/S2 +, no rubs, no gallops Respiratory: bilateral BS with occasional exp wheezes but overall good air movement. No crackles or rales.  Abdominal: Soft, NT, ND, bowel sounds + Extremities: no edema, no cyanosis   The results of significant diagnostics from this hospitalization (including imaging, microbiology, ancillary and laboratory) are listed below for reference.     Microbiology: No results found for this or any previous visit (from the past 240 hour(s)).   Labs: BNP (last 3 results) No results for input(s): BNP in the last 8760 hours. Basic Metabolic Panel: Recent Labs  Lab 08/23/18 1151  NA 134*  K 4.0  CL 98  CO2 25  GLUCOSE 96  BUN 7*  CREATININE 0.58  CALCIUM 8.9   Liver Function Tests: Recent Labs  Lab  08/23/18 1151  AST 33  ALT 20  ALKPHOS 77  BILITOT 0.8  PROT 7.0  ALBUMIN 3.6  No results for input(s): LIPASE, AMYLASE in the last 168 hours. No results for input(s): AMMONIA in the last 168 hours. CBC: Recent Labs  Lab 08/23/18 1151  WBC 5.0  NEUTROABS 3.3  HGB 13.2  HCT 41.0  MCV 105.4*  PLT 218   Cardiac Enzymes: No results for input(s): CKTOTAL, CKMB, CKMBINDEX, TROPONINI in the last 168 hours. BNP: Invalid input(s): POCBNP CBG: No results for input(s): GLUCAP in the last 168 hours. D-Dimer No results for input(s): DDIMER in the last 72 hours. Hgb A1c No results for input(s): HGBA1C in the last 72 hours. Lipid Profile No results for input(s): CHOL, HDL, LDLCALC, TRIG, CHOLHDL, LDLDIRECT in the last 72 hours. Thyroid function studies No results for input(s): TSH, T4TOTAL, T3FREE, THYROIDAB in the last 72 hours.  Invalid input(s): FREET3 Anemia work up No results for input(s): VITAMINB12, FOLATE, FERRITIN, TIBC, IRON, RETICCTPCT in the last 72 hours. Urinalysis No results found for: COLORURINE, APPEARANCEUR, LABSPEC, [place], GLUCOSEU, HGBUR, BILIRUBINUR, KETONESUR, PROTEINUR, UROBILINOGEN, NITRITE, LEUKOCYTESUR Sepsis Labs Invalid input(s): PROCALCITONIN,  WBC,  LACTICIDVEN Microbiology No results found for this or any previous visit (from the past 240 hour(s)).  Time coordinating discharge: 34 mins  SIGNED:  Irwin Brakeman, MD  [facility] 08/26/2018, 10:17 AM Pager [phone]  If 7PM-7AM, please contact night-coverage [email or web address] Password [email or web address]

## 2018-08-26 NOTE — Progress Notes (Signed)
SATURATION QUALIFICATIONS: (This note is used to comply with regulatory documentation for home oxygen)  Patient Saturations on Room Air at Rest = 91%  Patient Saturations on Room Air while Ambulating = 74%  Patient Saturations on 2 Liters of oxygen while Ambulating = 92%  Please briefly explain why patient needs home oxygen:Pt desats while ambulating on room air.

## 2018-08-26 NOTE — Progress Notes (Signed)
Patient has on going wheezes. Which she called early for neb tonight. Also has cough without sputum. X-ray shows emphysema / asthma like picture. Depressed diaphragm wide spacing to ribs. Will start her on incentive as it is doubtful she has many secretions for flutter. Hopefully her Saturation will increase and her air traping will improve.With steroids and bronchodilators.

## 2018-08-29 DIAGNOSIS — J441 Chronic obstructive pulmonary disease with (acute) exacerbation: Secondary | ICD-10-CM | POA: Diagnosis not present

## 2018-08-31 DIAGNOSIS — J449 Chronic obstructive pulmonary disease, unspecified: Secondary | ICD-10-CM | POA: Diagnosis not present

## 2018-08-31 DIAGNOSIS — Z681 Body mass index (BMI) 19 or less, adult: Secondary | ICD-10-CM | POA: Diagnosis not present

## 2018-08-31 DIAGNOSIS — W540XXD Bitten by dog, subsequent encounter: Secondary | ICD-10-CM | POA: Diagnosis not present

## 2018-09-14 DIAGNOSIS — Z681 Body mass index (BMI) 19 or less, adult: Secondary | ICD-10-CM | POA: Diagnosis not present

## 2018-09-14 DIAGNOSIS — B379 Candidiasis, unspecified: Secondary | ICD-10-CM | POA: Diagnosis not present

## 2018-09-14 DIAGNOSIS — Z1389 Encounter for screening for other disorder: Secondary | ICD-10-CM | POA: Diagnosis not present

## 2018-09-29 DIAGNOSIS — J441 Chronic obstructive pulmonary disease with (acute) exacerbation: Secondary | ICD-10-CM | POA: Diagnosis not present

## 2018-10-01 DIAGNOSIS — J9611 Chronic respiratory failure with hypoxia: Secondary | ICD-10-CM | POA: Diagnosis not present

## 2018-10-01 DIAGNOSIS — R69 Illness, unspecified: Secondary | ICD-10-CM | POA: Diagnosis not present

## 2018-10-01 DIAGNOSIS — J321 Chronic frontal sinusitis: Secondary | ICD-10-CM | POA: Diagnosis not present

## 2018-10-01 DIAGNOSIS — J449 Chronic obstructive pulmonary disease, unspecified: Secondary | ICD-10-CM | POA: Diagnosis not present

## 2018-10-29 DIAGNOSIS — J441 Chronic obstructive pulmonary disease with (acute) exacerbation: Secondary | ICD-10-CM | POA: Diagnosis not present

## 2018-11-19 DIAGNOSIS — Z681 Body mass index (BMI) 19 or less, adult: Secondary | ICD-10-CM | POA: Diagnosis not present

## 2018-11-19 DIAGNOSIS — R69 Illness, unspecified: Secondary | ICD-10-CM | POA: Diagnosis not present

## 2018-11-29 DIAGNOSIS — J441 Chronic obstructive pulmonary disease with (acute) exacerbation: Secondary | ICD-10-CM | POA: Diagnosis not present

## 2018-12-06 ENCOUNTER — Other Ambulatory Visit (HOSPITAL_COMMUNITY): Payer: Self-pay | Admitting: Family Medicine

## 2018-12-06 DIAGNOSIS — Z1231 Encounter for screening mammogram for malignant neoplasm of breast: Secondary | ICD-10-CM

## 2018-12-19 DIAGNOSIS — J449 Chronic obstructive pulmonary disease, unspecified: Secondary | ICD-10-CM | POA: Diagnosis not present

## 2018-12-19 DIAGNOSIS — Z681 Body mass index (BMI) 19 or less, adult: Secondary | ICD-10-CM | POA: Diagnosis not present

## 2018-12-20 ENCOUNTER — Ambulatory Visit (HOSPITAL_COMMUNITY): Payer: Medicare HMO

## 2018-12-24 ENCOUNTER — Ambulatory Visit (HOSPITAL_COMMUNITY)
Admission: RE | Admit: 2018-12-24 | Discharge: 2018-12-24 | Disposition: A | Discharge status: Home / Self Care | Payer: Medicare HMO | Source: Ambulatory Visit | Attending: Family Medicine | Admitting: Family Medicine

## 2018-12-24 DIAGNOSIS — Z1231 Encounter for screening mammogram for malignant neoplasm of breast: Secondary | ICD-10-CM | POA: Diagnosis not present

## 2018-12-30 DIAGNOSIS — J441 Chronic obstructive pulmonary disease with (acute) exacerbation: Secondary | ICD-10-CM | POA: Diagnosis not present

## 2019-01-03 DIAGNOSIS — J441 Chronic obstructive pulmonary disease with (acute) exacerbation: Secondary | ICD-10-CM | POA: Diagnosis not present

## 2019-01-03 DIAGNOSIS — R69 Illness, unspecified: Secondary | ICD-10-CM | POA: Diagnosis not present

## 2019-01-03 DIAGNOSIS — T50905A Adverse effect of unspecified drugs, medicaments and biological substances, initial encounter: Secondary | ICD-10-CM | POA: Diagnosis not present

## 2019-01-15 DIAGNOSIS — Z681 Body mass index (BMI) 19 or less, adult: Secondary | ICD-10-CM | POA: Diagnosis not present

## 2019-01-15 DIAGNOSIS — J449 Chronic obstructive pulmonary disease, unspecified: Secondary | ICD-10-CM | POA: Diagnosis not present

## 2019-01-15 DIAGNOSIS — R0602 Shortness of breath: Secondary | ICD-10-CM | POA: Diagnosis not present

## 2019-01-15 DIAGNOSIS — Z1389 Encounter for screening for other disorder: Secondary | ICD-10-CM | POA: Diagnosis not present

## 2019-01-25 ENCOUNTER — Ambulatory Visit: Payer: Medicare HMO | Admitting: Pulmonary Disease

## 2019-01-25 ENCOUNTER — Other Ambulatory Visit: Payer: Self-pay

## 2019-01-25 ENCOUNTER — Encounter: Payer: Self-pay | Admitting: Pulmonary Disease

## 2019-01-25 DIAGNOSIS — J9611 Chronic respiratory failure with hypoxia: Secondary | ICD-10-CM

## 2019-01-25 DIAGNOSIS — J441 Chronic obstructive pulmonary disease with (acute) exacerbation: Secondary | ICD-10-CM | POA: Diagnosis not present

## 2019-01-25 MED ORDER — ROFLUMILAST 250 MCG PO TABS: 250.0000 ug | ORAL_TABLET | Freq: Every day | ORAL | 1 refills | Status: DC

## 2019-01-25 MED ORDER — FLUTICASONE-UMECLIDIN-VILANT 100-62.5-25 MCG/INH IN AEPB: 1.0000 | INHALATION_SPRAY | Freq: Every day | RESPIRATORY_TRACT | 0 refills | Status: DC

## 2019-01-25 NOTE — Assessment & Plan Note (Signed)
She seems to have decompensated after her hospitalization and dog bite-probably due to being sedentary after this episode.  No other cause of drop in lung function detected  Schedule PFTs to establish baseline lung function Obtain prior PFTs/spirometry from Dr. Luan Pulling office.  Continue using oxygen during sleep. Sample of Trelegy -this will take the place of Breo and Incruse, call us if this works and if covered by her insurance. Refills on Daliresp

## 2019-01-25 NOTE — Patient Instructions (Signed)
Schedule PFTs Obtain prior PFTs/spirometry from Dr. Luan Pulling office.  Continue using oxygen during sleep. Sample of Trelegy -this will take the place of Breo and Incruse, call us if this works and if covered by her insurance. Refills on Daliresp

## 2019-01-25 NOTE — Addendum Note (Signed)
Addended by: Valerie Salts on: 01/25/2019 04:56 PM   Modules accepted: Orders

## 2019-01-25 NOTE — Progress Notes (Signed)
Subjective:    Patient ID: Brittany Miranda, female    DOB: [birth year], 68 y.o.   MRN: [id number]  HPI  Chief Complaint  Patient presents with  . Pulm Consult    Referred by Dr. Hilma Favors for COPD. Discovered this after being hospitalized back in Sept 2019. States she has not been the same since then.    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  She has seen Dr. Georgiann Cocker in [place] prior who is retiring and would like to establish with another pulmonologist.  She was diagnosed about 4 years ago with COPD.  She smoked about 30 pack years before she quit in 1999.  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  After retirement she has stayed active and would power walk about 3 miles daily.  She had an unfortunate incident of dog bite in her left leg which required skin grafting this left her sedentary for about 2 months.  She was hospitalized 08/2018 for COPd exacerbation - dc'd on O2.  She attributes this to the episode of intubation.  She was treated with steroids and gradually recovered.  But since then she has had repeated episodes of bronchitis. She has been maintained on a regimen of Breo and Incruse and Daliresp has been added.  She seldom uses her albuterol MDI for rescue.  She has not needed her Xopenex nebs in a while. She has slowly regained her activity and is back to walking 3 miles daily.  She has felt better in the last week when it is warmer.  Occasionally when she wakes up in the morning she finds her oxygen saturations to be low and she has restarted using oxygen at night  She stays active and works in her yard and is able to [employer] her yard and trim shrubs. She denies cough, sputum production and has intermittent wheezing.  She denies pedal edema    Past Medical History:  Diagnosis Date  . Anxiety   . Colon polyps    adenomas in remote past   . COPD (chronic obstructive pulmonary disease) ([place])   .  Depression   . Osteopenia    Past Surgical History:  Procedure Laterality Date  . APPENDECTOMY    . COLONOSCOPY  11/2005   Dr. Ala Bent diverticula  . COLONOSCOPY  2000   [facility]-->per patient colon polyps, adenomatous  . COLONOSCOPY N/A 07/08/2016   Procedure: COLONOSCOPY;  Surgeon: Daneil Dolin, MD;  [facility];  Service: Endoscopy;  Laterality: N/A;  145  . IRRIGATION AND DEBRIDEMENT OF WOUND WITH SPLIT THICKNESS SKIN GRAFT Left 08/13/2018   Procedure: IRRIGATION AND DEBRIDEMENT OF LEFT LEG WOUND WITH SPLIT THICKNESS SKIN GRAFT;  Surgeon: Cristine Polio, MD;  Location: [facility];  Service: Plastics;  Laterality: Left;  . SKIN SPLIT GRAFT Left 08/13/2018   Procedure: SKIN GRAFT SPLIT THICKNESS;  Surgeon: Cristine Polio, MD;  Location: [facility];  Service: Plastics;  Laterality: Left;    No Known Allergies  Social History   Socioeconomic History  . Marital status: Divorced    Spouse name: Not on file  . Number of children: 1  . Years of education: Not on file  . Highest education level: Not on file  Occupational History  . Occupation: retired    Fish farm manager: RETIRED    CommentArt gallery manager Loss adjuster, chartered) at [employer]  . Financial resource strain: Not on  file  . Food insecurity:    Worry: Not on file    Inability: Not on file  . Transportation needs:    Medical: Not on file    Non-medical: Not on file  Tobacco Use  . Smoking status: Former Smoker    Last attempt to quit: 04/04/1998    Years since quitting: 20.8  . Smokeless tobacco: Never Used  Substance and Sexual Activity  . Alcohol use: No    Comment: socially  . Drug use: No  . Sexual activity: Not on file  Lifestyle  . Physical activity:    Days per week: Not on file    Minutes per session: Not on file  . Stress: Not on file  Relationships  . Social connections:    Talks on phone: Not on file    Gets together: Not on file    Attends religious  service: Not on file    Active member of club or organization: Not on file    Attends meetings of clubs or organizations: Not on file    Relationship status: Not on file  . Intimate partner violence:    Fear of current or ex partner: Not on file    Emotionally abused: Not on file    Physically abused: Not on file    Forced sexual activity: Not on file  Other Topics Concern  . Not on file  Social History Narrative  . Not on file     Family History  Problem Relation Age of Onset  . Kidney disease Mother   . Kidney disease Father   . Liver disease Neg Hx   . Colon cancer Neg Hx      Review of Systems  Constitutional: Positive for unexpected weight change. Negative for fever.  HENT: Negative for congestion, dental problem, ear pain, nosebleeds, postnasal drip, rhinorrhea, sinus pressure, sneezing, sore throat and trouble swallowing.   Eyes: Negative for redness and itching.  Respiratory: Positive for shortness of breath. Negative for cough, chest tightness and wheezing.   Cardiovascular: Negative for palpitations and leg swelling.  Gastrointestinal: Negative for nausea and vomiting.  Genitourinary: Negative for dysuria.  Musculoskeletal: Negative for joint swelling.  Skin: Negative for rash.  Allergic/Immunologic: Negative.  Negative for environmental allergies, food allergies and immunocompromised state.  Neurological: Negative for headaches.  Hematological: Does not bruise/bleed easily.  Psychiatric/Behavioral: Negative for dysphoric mood. The patient is not nervous/anxious.        Objective:   Physical Exam  Gen. Pleasant, thin, in no distress, normal affect ENT - no pallor,icterus, no post nasal drip Neck: No JVD, no thyromegaly, no carotid bruits Lungs: no use of accessory muscles, no dullness to percussion, decreased without rales or rhonchi  Cardiovascular: Rhythm regular, heart sounds  normal, no murmurs or gallops, no peripheral edema Abdomen: soft and  non-tender, no hepatosplenomegaly, BS normal. Musculoskeletal: No deformities, no cyanosis or clubbing, left leg skin graft Neuro:  alert, non focal, no tremors       Assessment & Plan:

## 2019-01-25 NOTE — Addendum Note (Signed)
Addended by: Valerie Salts on: 01/25/2019 04:36 PM   Modules accepted: Orders

## 2019-01-25 NOTE — Assessment & Plan Note (Signed)
Due to her reporting of low oxygen saturations in the morning, continue nocturnal O2 for now. Reassess with nocturnal oximetry in 3 months

## 2019-01-28 DIAGNOSIS — J441 Chronic obstructive pulmonary disease with (acute) exacerbation: Secondary | ICD-10-CM | POA: Diagnosis not present

## 2019-02-05 ENCOUNTER — Telehealth: Payer: Self-pay | Admitting: Pulmonary Disease

## 2019-02-05 MED ORDER — FLUTICASONE-UMECLIDIN-VILANT 100-62.5-25 MCG/INH IN AEPB: 1.0000 | INHALATION_SPRAY | Freq: Every day | RESPIRATORY_TRACT | 2 refills | Status: DC

## 2019-02-05 NOTE — Telephone Encounter (Signed)
Spoke with pt.  Verified the preferred pharmacy and script needed.  Script sent.  Nothing further needed.

## 2019-02-18 ENCOUNTER — Institutional Professional Consult (permissible substitution): Payer: Medicare HMO | Admitting: Pulmonary Disease

## 2019-02-28 DIAGNOSIS — J441 Chronic obstructive pulmonary disease with (acute) exacerbation: Secondary | ICD-10-CM | POA: Diagnosis not present

## 2019-03-07 DIAGNOSIS — Z681 Body mass index (BMI) 19 or less, adult: Secondary | ICD-10-CM | POA: Diagnosis not present

## 2019-03-07 DIAGNOSIS — R69 Illness, unspecified: Secondary | ICD-10-CM | POA: Diagnosis not present

## 2019-03-30 DIAGNOSIS — J441 Chronic obstructive pulmonary disease with (acute) exacerbation: Secondary | ICD-10-CM | POA: Diagnosis not present

## 2019-04-17 ENCOUNTER — Other Ambulatory Visit: Payer: Medicare HMO

## 2019-04-17 ENCOUNTER — Other Ambulatory Visit: Payer: Self-pay

## 2019-04-17 DIAGNOSIS — Z20822 Contact with and (suspected) exposure to covid-19: Secondary | ICD-10-CM

## 2019-04-17 DIAGNOSIS — R6889 Other general symptoms and signs: Secondary | ICD-10-CM | POA: Diagnosis not present

## 2019-04-19 LAB — NOVEL CORONAVIRUS, NAA: SARS-CoV-2, NAA: NOT DETECTED

## 2019-04-30 DIAGNOSIS — J441 Chronic obstructive pulmonary disease with (acute) exacerbation: Secondary | ICD-10-CM | POA: Diagnosis not present

## 2019-05-06 ENCOUNTER — Ambulatory Visit: Payer: Medicare HMO | Admitting: Pulmonary Disease

## 2019-05-15 ENCOUNTER — Other Ambulatory Visit: Payer: Self-pay | Admitting: Pulmonary Disease

## 2019-05-30 DIAGNOSIS — J441 Chronic obstructive pulmonary disease with (acute) exacerbation: Secondary | ICD-10-CM | POA: Diagnosis not present

## 2019-06-12 DIAGNOSIS — Z681 Body mass index (BMI) 19 or less, adult: Secondary | ICD-10-CM | POA: Diagnosis not present

## 2019-06-12 DIAGNOSIS — R69 Illness, unspecified: Secondary | ICD-10-CM | POA: Diagnosis not present

## 2019-06-12 DIAGNOSIS — Z0000 Encounter for general adult medical examination without abnormal findings: Secondary | ICD-10-CM | POA: Diagnosis not present

## 2019-06-12 DIAGNOSIS — Z1389 Encounter for screening for other disorder: Secondary | ICD-10-CM | POA: Diagnosis not present

## 2019-06-12 DIAGNOSIS — M8580 Other specified disorders of bone density and structure, unspecified site: Secondary | ICD-10-CM | POA: Diagnosis not present

## 2019-06-30 DIAGNOSIS — J441 Chronic obstructive pulmonary disease with (acute) exacerbation: Secondary | ICD-10-CM | POA: Diagnosis not present

## 2019-07-31 DIAGNOSIS — J441 Chronic obstructive pulmonary disease with (acute) exacerbation: Secondary | ICD-10-CM | POA: Diagnosis not present

## 2019-08-08 ENCOUNTER — Other Ambulatory Visit: Payer: Self-pay | Admitting: *Deleted

## 2019-08-08 DIAGNOSIS — R6889 Other general symptoms and signs: Secondary | ICD-10-CM | POA: Diagnosis not present

## 2019-08-08 DIAGNOSIS — Z20822 Contact with and (suspected) exposure to covid-19: Secondary | ICD-10-CM

## 2019-08-10 LAB — SPECIMEN STATUS REPORT

## 2019-08-10 LAB — NOVEL CORONAVIRUS, NAA: SARS-CoV-2, NAA: NOT DETECTED

## 2019-08-30 DIAGNOSIS — J441 Chronic obstructive pulmonary disease with (acute) exacerbation: Secondary | ICD-10-CM | POA: Diagnosis not present

## 2019-09-05 DIAGNOSIS — Z23 Encounter for immunization: Secondary | ICD-10-CM | POA: Diagnosis not present

## 2019-09-13 ENCOUNTER — Other Ambulatory Visit: Payer: Self-pay

## 2019-09-13 DIAGNOSIS — Z20822 Contact with and (suspected) exposure to covid-19: Secondary | ICD-10-CM

## 2019-09-14 LAB — NOVEL CORONAVIRUS, NAA: SARS-CoV-2, NAA: NOT DETECTED

## 2019-09-30 DIAGNOSIS — J441 Chronic obstructive pulmonary disease with (acute) exacerbation: Secondary | ICD-10-CM | POA: Diagnosis not present

## 2019-10-17 IMAGING — DX DG CHEST 2V
2 series · 2 of 2 positions shown · non-contrast
Comparison: 01/21/2016

CLINICAL DATA: Cough since last night.

EXAM:
CHEST - 2 VIEW

[chest pa]
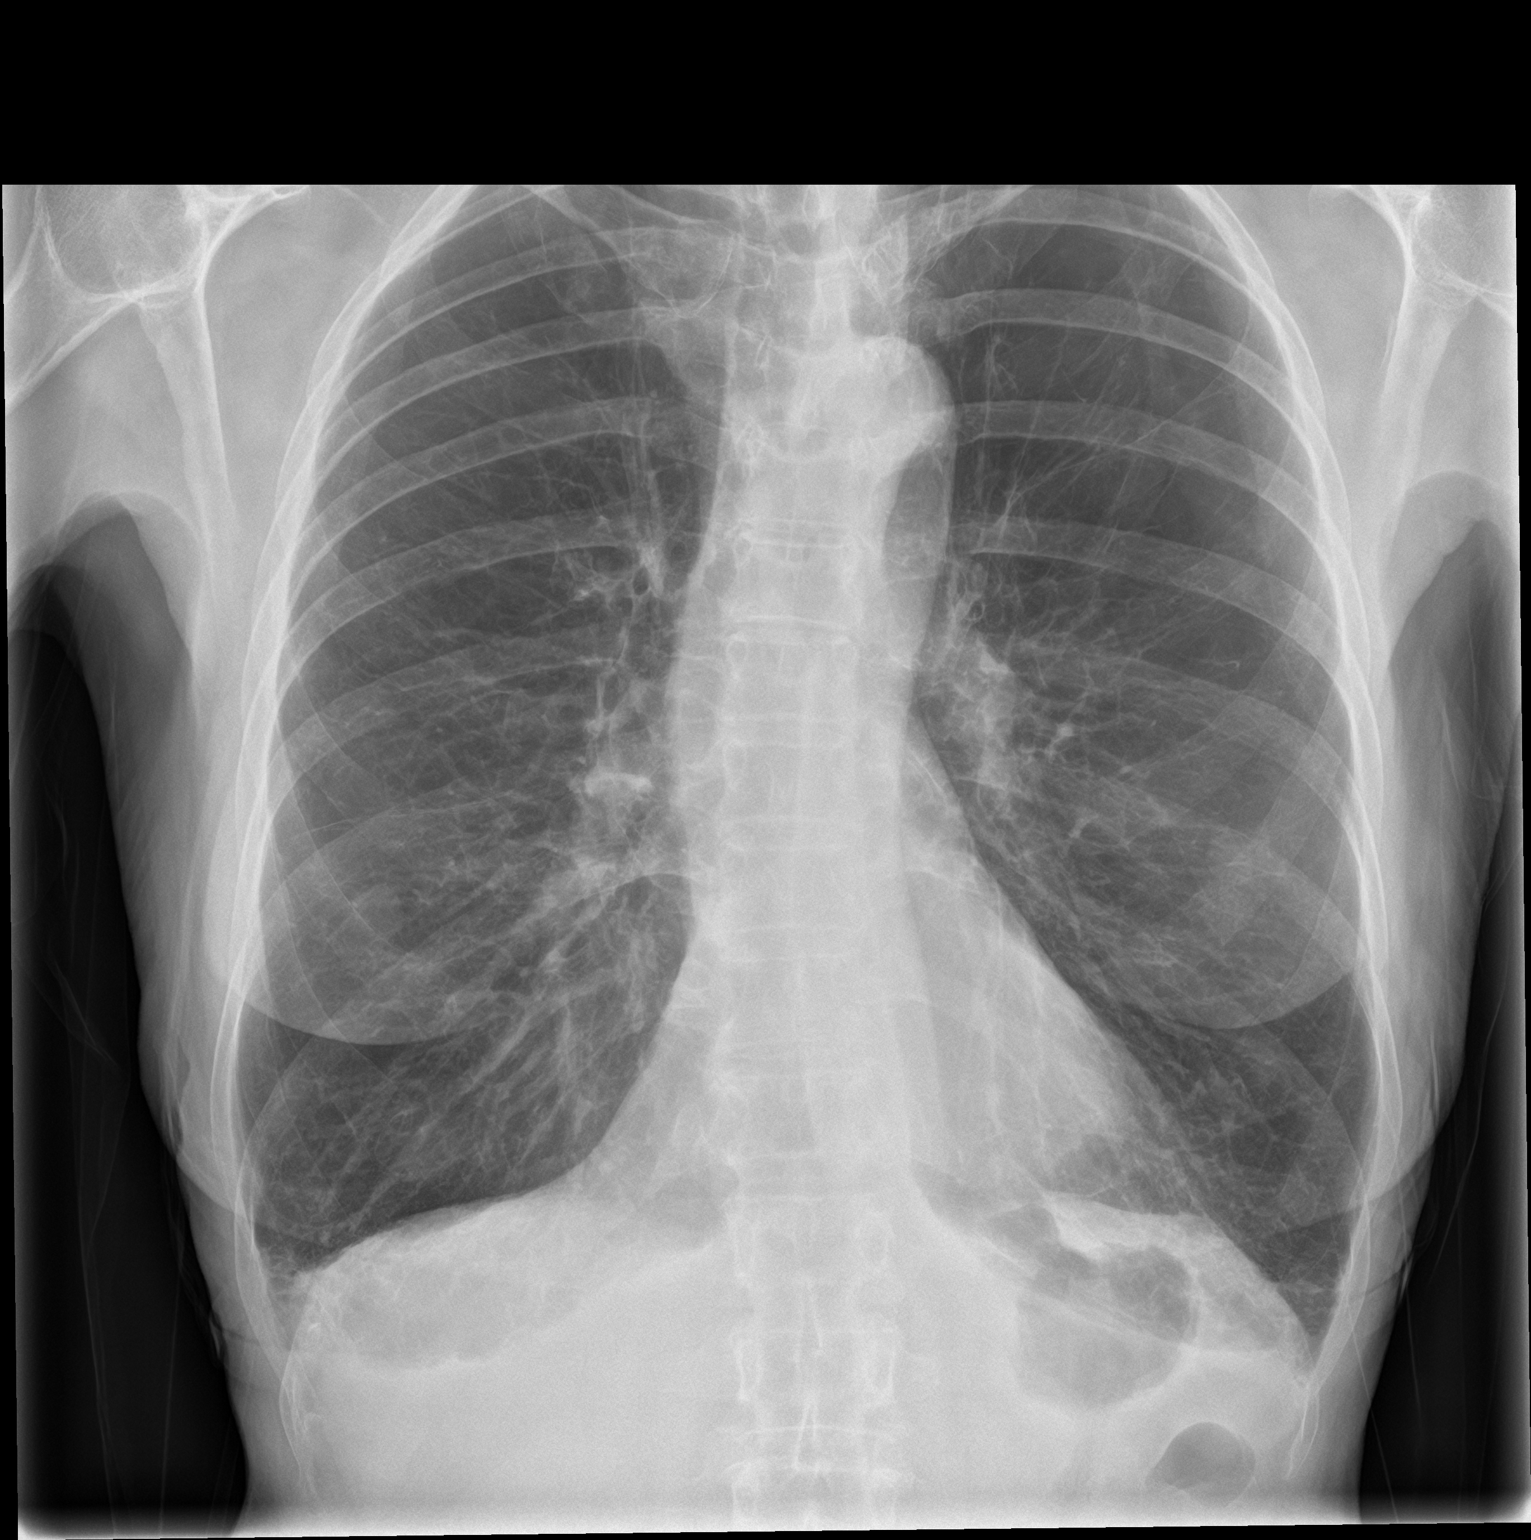

[chest lat]
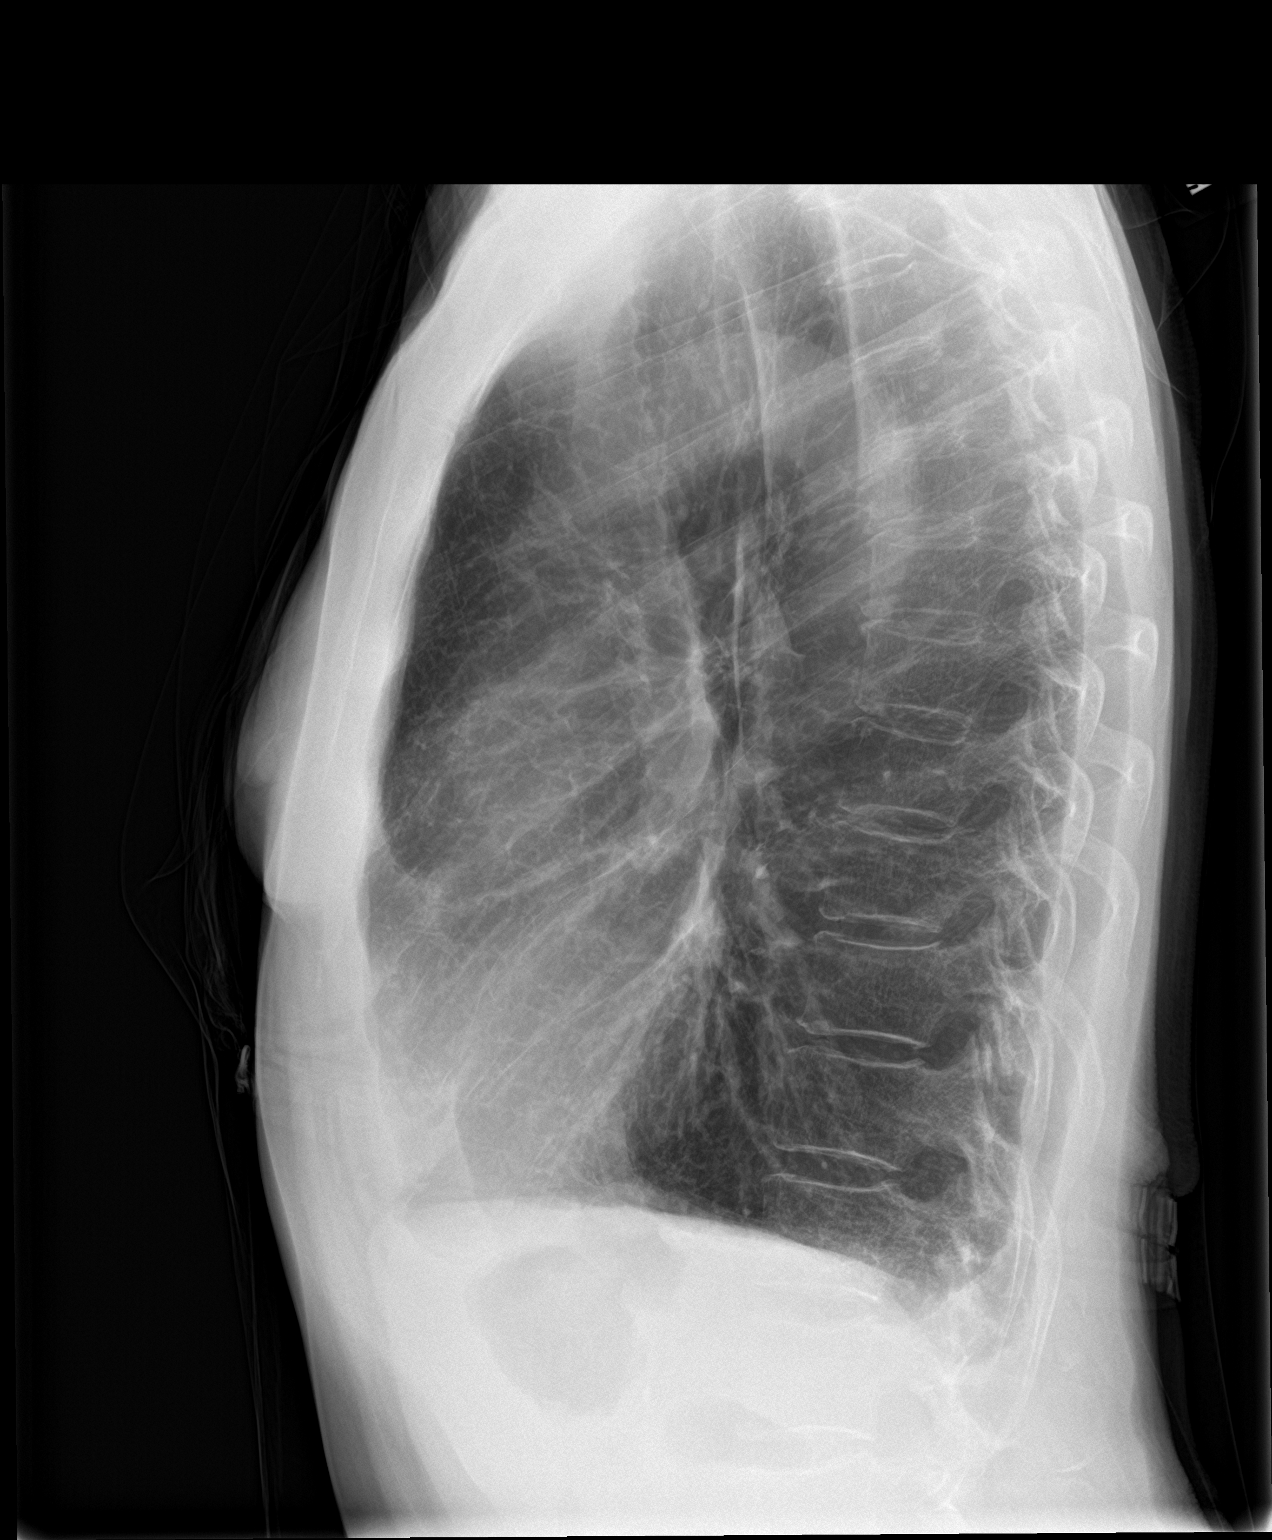

[2 of 2 positions shown; findings below may reference images not displayed]

FINDINGS: The lungs are hyperinflated likely secondary to COPD. There is no
focal parenchymal opacity. There is no pleural effusion or
pneumothorax. The heart and mediastinal contours are unremarkable.

The osseous structures are unremarkable.
IMPRESSION: No active cardiopulmonary disease.

## 2019-10-24 DIAGNOSIS — R69 Illness, unspecified: Secondary | ICD-10-CM | POA: Diagnosis not present

## 2019-10-30 DIAGNOSIS — J441 Chronic obstructive pulmonary disease with (acute) exacerbation: Secondary | ICD-10-CM | POA: Diagnosis not present

## 2019-11-21 ENCOUNTER — Other Ambulatory Visit (HOSPITAL_COMMUNITY): Payer: Self-pay | Admitting: Family Medicine

## 2019-11-21 DIAGNOSIS — Z1231 Encounter for screening mammogram for malignant neoplasm of breast: Secondary | ICD-10-CM

## 2019-11-30 DIAGNOSIS — J441 Chronic obstructive pulmonary disease with (acute) exacerbation: Secondary | ICD-10-CM | POA: Diagnosis not present

## 2019-12-02 ENCOUNTER — Ambulatory Visit (HOSPITAL_COMMUNITY): Payer: Medicare HMO

## 2019-12-17 ENCOUNTER — Other Ambulatory Visit: Payer: Self-pay | Admitting: Pulmonary Disease

## 2019-12-23 ENCOUNTER — Ambulatory Visit: Payer: Medicare HMO | Attending: Internal Medicine

## 2019-12-23 ENCOUNTER — Other Ambulatory Visit: Payer: Self-pay

## 2019-12-23 DIAGNOSIS — Z20822 Contact with and (suspected) exposure to covid-19: Secondary | ICD-10-CM

## 2019-12-24 LAB — NOVEL CORONAVIRUS, NAA: SARS-CoV-2, NAA: NOT DETECTED

## 2020-01-02 IMAGING — DX DG TIBIA/FIBULA 2V*L*
4 series · 4 of 4 positions shown · non-contrast
Comparison: None.

CLINICAL DATA: Dog bite, evaluate for osseous injury.

EXAM:
LEFT TIBIA AND FIBULA - 2 VIEW

[tibia ap (1 of 2)]
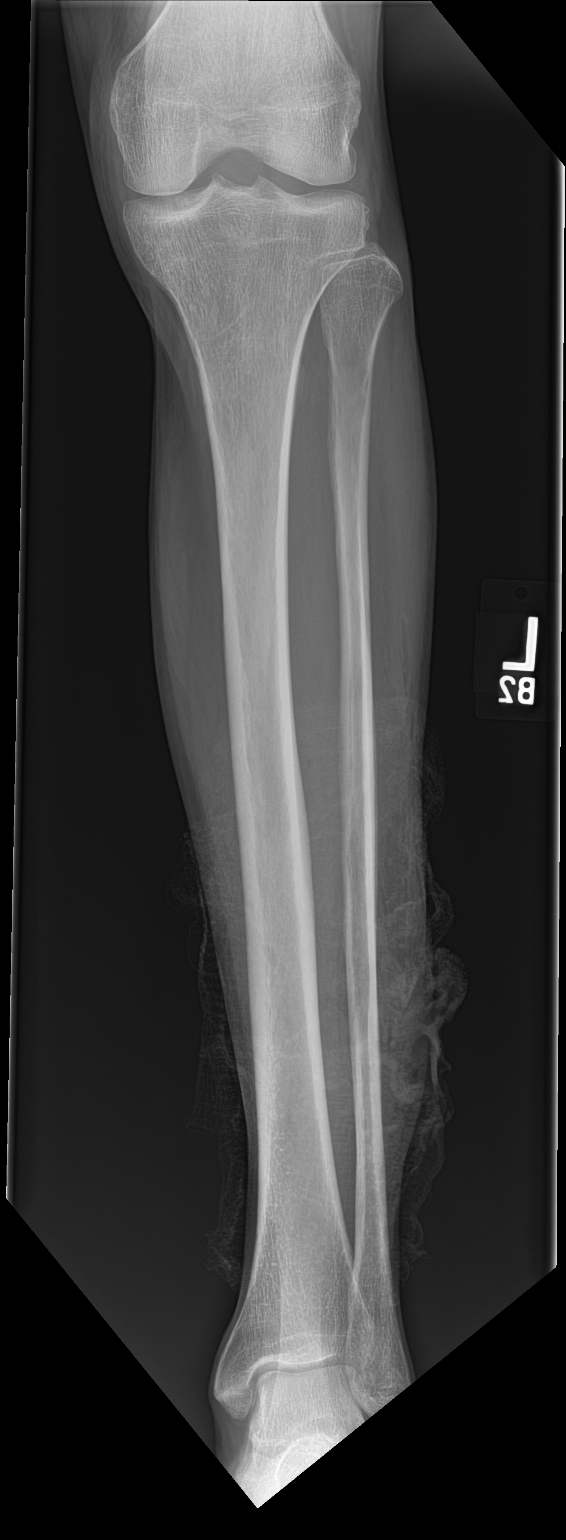

[tibia ap (2 of 2)]
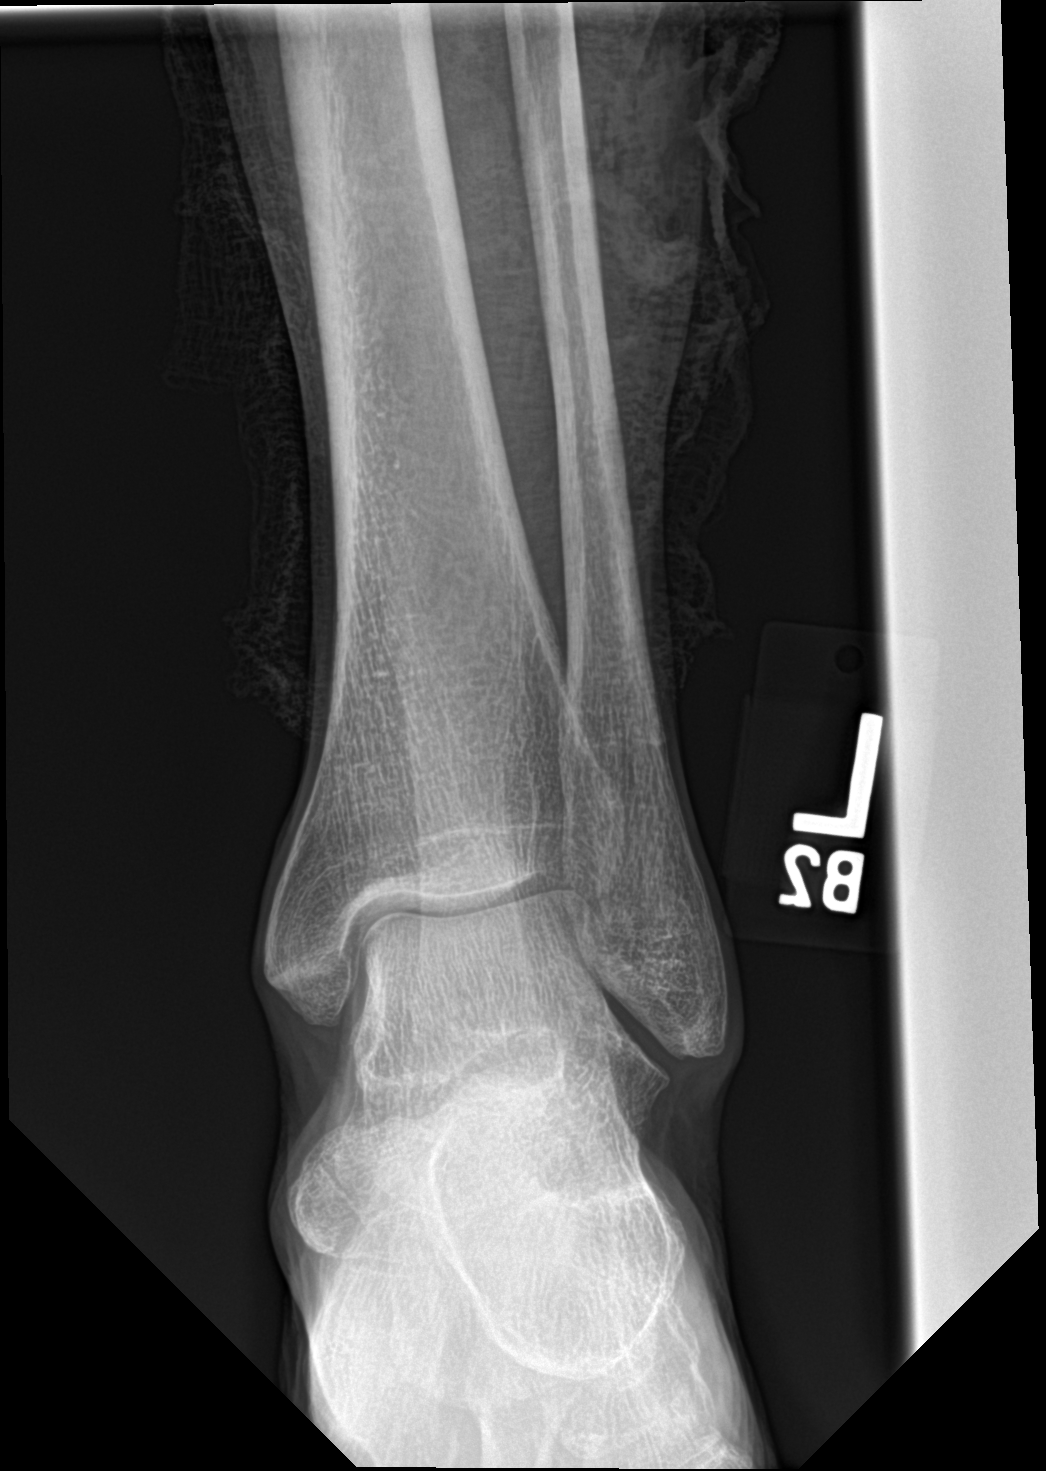

[tibia lat (1 of 2)]
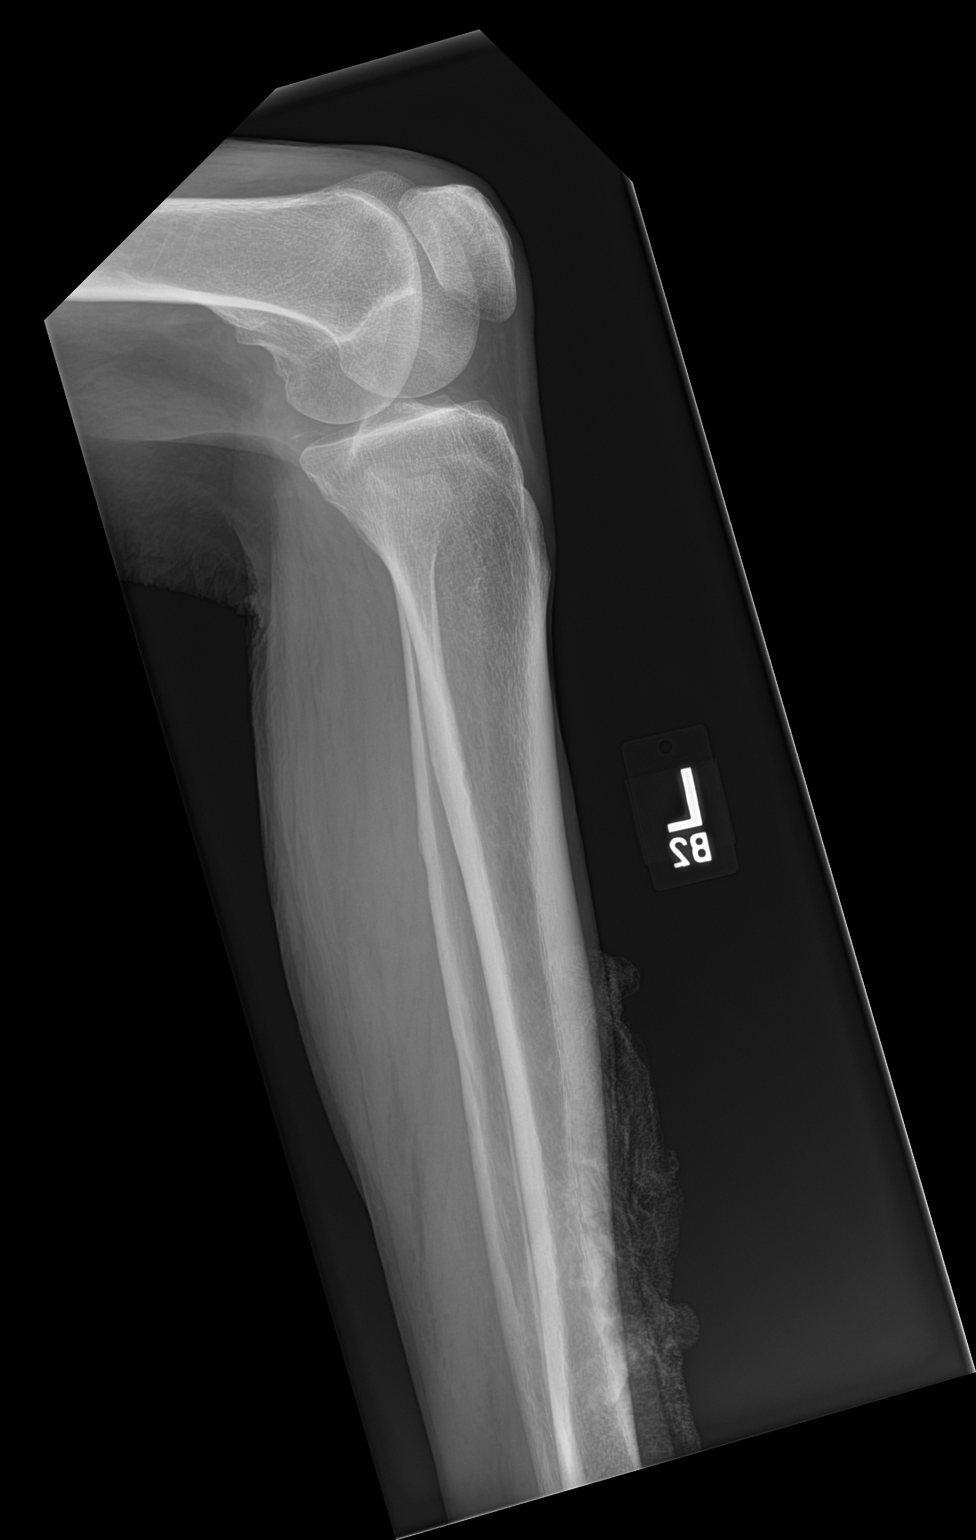

[tibia lat (2 of 2)]
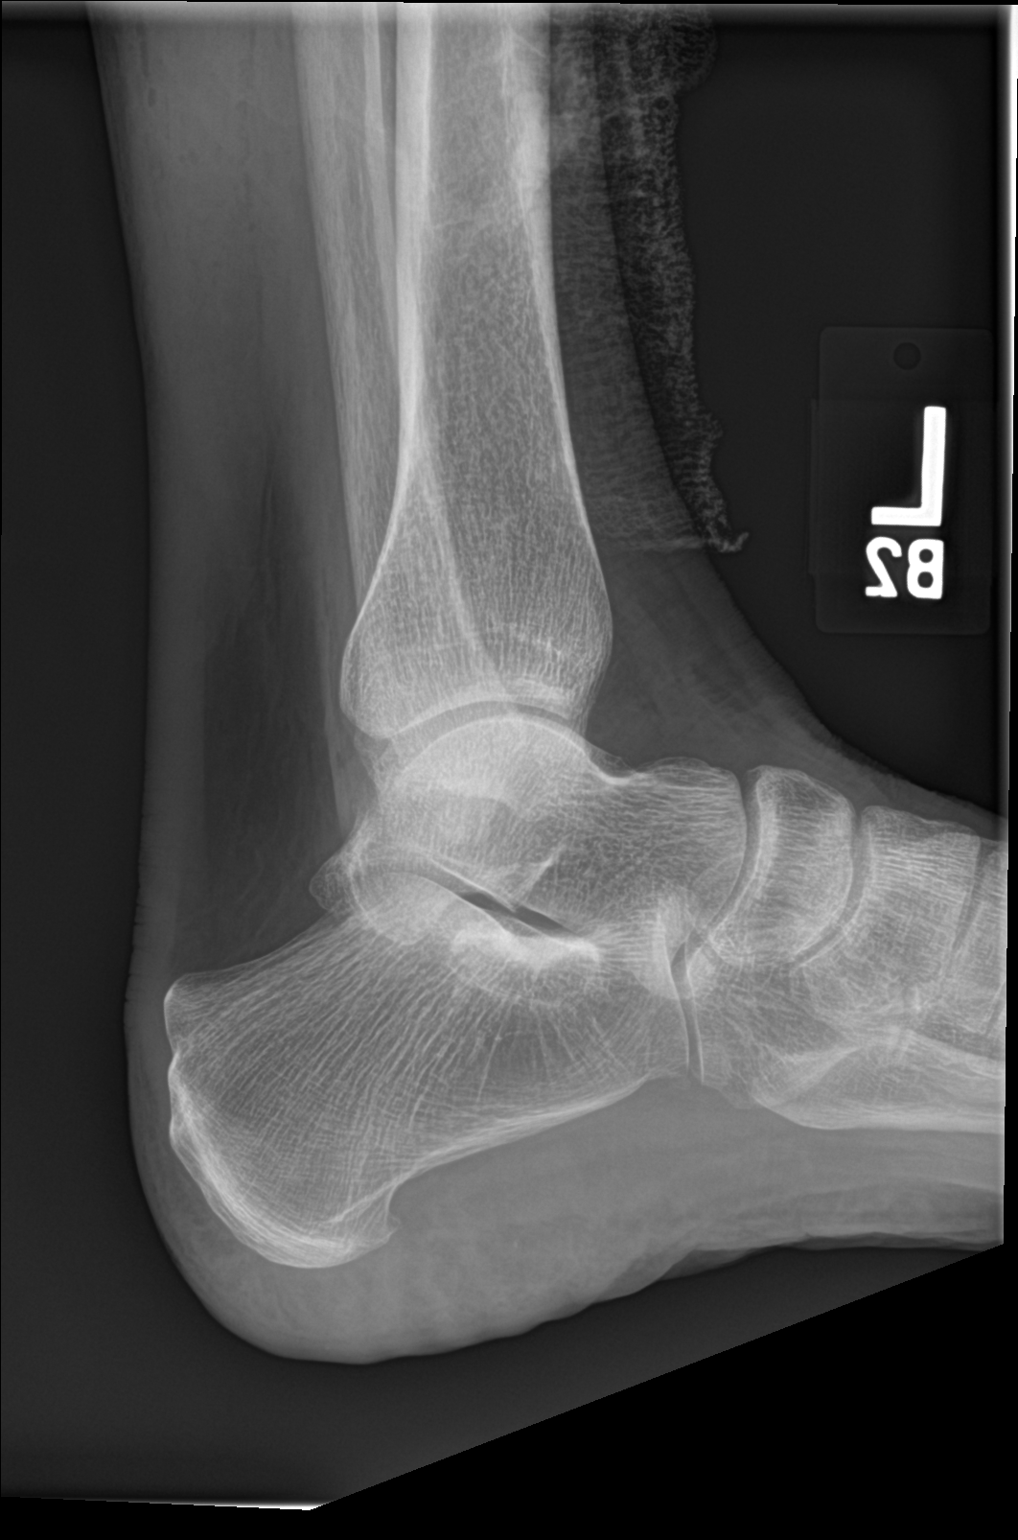

[4 of 4 positions shown; findings below may reference images not displayed]

FINDINGS: Soft tissue injury along lateral aspect of the lower leg. No
underlying osseous abnormality. No radiopaque foreign body.
IMPRESSION: Soft tissue injury along the lateral lower leg. No acute osseous
abnormality.

## 2020-01-03 ENCOUNTER — Other Ambulatory Visit: Payer: Self-pay

## 2020-01-03 ENCOUNTER — Ambulatory Visit (HOSPITAL_COMMUNITY)
Admission: RE | Admit: 2020-01-03 | Discharge: 2020-01-03 | Disposition: A | Discharge status: Home / Self Care | Payer: Medicare HMO | Source: Ambulatory Visit | Attending: Family Medicine | Admitting: Family Medicine

## 2020-01-03 DIAGNOSIS — Z1231 Encounter for screening mammogram for malignant neoplasm of breast: Secondary | ICD-10-CM | POA: Diagnosis not present

## 2020-06-15 ENCOUNTER — Other Ambulatory Visit: Payer: Self-pay

## 2020-06-15 ENCOUNTER — Emergency Department (HOSPITAL_COMMUNITY): Payer: Medicare HMO

## 2020-06-15 ENCOUNTER — Emergency Department (HOSPITAL_COMMUNITY)
Admission: EM | Admit: 2020-06-15 | Discharge: 2020-06-15 | Disposition: A | Discharge status: Home / Self Care | Payer: Medicare HMO | Attending: Emergency Medicine | Admitting: Emergency Medicine

## 2020-06-15 ENCOUNTER — Encounter (HOSPITAL_COMMUNITY): Payer: Self-pay | Admitting: *Deleted

## 2020-06-15 DIAGNOSIS — Z87891 Personal history of nicotine dependence: Secondary | ICD-10-CM | POA: Insufficient documentation

## 2020-06-15 DIAGNOSIS — Z79899 Other long term (current) drug therapy: Secondary | ICD-10-CM | POA: Insufficient documentation

## 2020-06-15 DIAGNOSIS — M25551 Pain in right hip: Secondary | ICD-10-CM

## 2020-06-15 DIAGNOSIS — J449 Chronic obstructive pulmonary disease, unspecified: Secondary | ICD-10-CM | POA: Diagnosis not present

## 2020-06-15 MED ORDER — METHOCARBAMOL 500 MG PO TABS: 500.0000 mg | ORAL_TABLET | Freq: Three times a day (TID) | ORAL | 0 refills | Status: DC | PRN

## 2020-06-15 MED ORDER — LIDOCAINE 5 % EX PTCH: 1.0000 | MEDICATED_PATCH | CUTANEOUS | 0 refills | Status: DC

## 2020-06-15 MED ORDER — LIDOCAINE 5 % EX PTCH
1.0000 | MEDICATED_PATCH | CUTANEOUS | Status: DC
  Administered 2020-06-15: 1 via TRANSDERMAL
  Filled 2020-06-15: qty 1

## 2020-06-15 NOTE — Discharge Instructions (Addendum)
I suspect that you have piriformis syndrome.  Today you had a lidocaine or numbing patch put on your right sided buttock.  This can be left on for 12 hours.  After 12 hours it needs to be removed and you need to wait a full 12 hours at least before putting a new patch on or it will not work. If it helps you can get additional patches from the drugstore and I have given you a prescription for them.  It may be cheaper for you to get them over-the-counter without a prescription.   Do not put ice or heat on your skin while this patch is on.  Doing so can change how much of the medicine absorbs into your body and can cause skin irritation.  Prior to using ice or heat take the patch off and clean your skin in that area.  I have given you a prescription today for Robaxin.  This is a muscle relaxer.  It can make you drowsy and sleepy and you should not drive, operate heavy machinery, or perform other dangerous tasks while taking this medicine.  Please try to make sure you are moving around, as laying in bed or sitting in a chair all day will not help with this muscle loosen up and will make it take longer for you to get better and have worsened pain.  Please keep your appointment for your MRI.  If you develop changes to bowel or bladder function, most commonly when you are awake urinating without feeling the need to urinate or inability to fully empty your bladder, you develop numbness over your genitals, fevers, or have other new concerns please seek additional medical care and evaluation.  Robaxin (and having an injury) can make you more likely to fall.  Please use extra caution.

## 2020-06-15 NOTE — ED Provider Notes (Signed)
[facility] EMERGENCY DEPARTMENT Provider Note   CSN: [id number] Arrival date & time: 06/15/20  1307     History Chief Complaint  Patient presents with   Hip Pain    Brittany Miranda is a 69 y.o. female with a past medical history of anxiety, COPD, osteopenia, depression, who presents today for evaluation of pain in her right hip.  About 2 weeks ago she picked something up and had a pain in her right hip.  She was able to finish mowing her lawn however since then has had worsening pain in the right hip/buttock.  She has been seen already by orthopedics for this, she reports that they did x-rays of her hip, pelvis, and her back, gave her steroid shots in both hips and did not see any fractures.  She states that then she was seen by her primary care doctor who gave her a Toradol shot.  She continues to have pain.  Her primary care doctor reportedly gave her oxycodone which she states helps slightly with the pain.  She denies any fevers.  No significant hits or blows to her hip.  Her pain has been worsening since this started.  She is primarily laying in bed or sitting in a chair all day.  She has been using a cane, a walker, and a bedside commode.  She reports that she has difficulty getting to the bedside commode in time due to pain however feels like she is still able to fully empty her bladder.  She denies any saddle anesthesias/paresthesias.  She does report some decreased sensation in the toes only on the right foot.  She has an MRI scheduled on Thursday.  HPI     Past Medical History:  Diagnosis Date   Anxiety    Colon polyps    adenomas in remote past    COPD (chronic obstructive pulmonary disease) ([place])    Depression    Osteopenia     Patient Active Problem List   Diagnosis Date Noted   Anxiety 08/24/2018   Chronic respiratory failure with hypoxia ([place]) 08/24/2018   COPD with acute exacerbation ([place]) 08/23/2018   Depression with anxiety 08/23/2018   Colon polyps  04/05/2011    Past Surgical History:  Procedure Laterality Date   APPENDECTOMY     COLONOSCOPY  11/2005   Dr. Ala Bent diverticula   COLONOSCOPY  2000   [facility]-->per patient colon polyps, adenomatous   COLONOSCOPY N/A 07/08/2016   Procedure: COLONOSCOPY;  Surgeon: Daneil Dolin, MD;  [facility];  Service: Endoscopy;  Laterality: N/A;  145   IRRIGATION AND DEBRIDEMENT OF WOUND WITH SPLIT THICKNESS SKIN GRAFT Left 08/13/2018   Procedure: IRRIGATION AND DEBRIDEMENT OF LEFT LEG WOUND WITH SPLIT THICKNESS SKIN GRAFT;  Surgeon: Cristine Polio, MD;  Location: [facility];  Service: Plastics;  Laterality: Left;   SKIN SPLIT GRAFT Left 08/13/2018   Procedure: SKIN GRAFT SPLIT THICKNESS;  Surgeon: Cristine Polio, MD;  Location: [place];  Service: Plastics;  Laterality: Left;     OB History   No obstetric history on file.     Family History  Problem Relation Age of Onset   Kidney disease Mother    Kidney disease Father    Liver disease Neg Hx    Colon cancer Neg Hx     Social History   Tobacco Use   Smoking status: Former Smoker    Quit date: 04/04/1998    Years since quitting: 22.2  Smokeless tobacco: Never Used  Substance Use Topics   Alcohol use: No    Comment: socially   Drug use: No    Home Medications Prior to Admission medications   Medication Sig Start Date End Date Taking? Authorizing Provider  albuterol (PROVENTIL HFA;VENTOLIN HFA) 108 (90 Base) MCG/ACT inhaler Inhale 1 puff into the lungs every 6 (six) hours as needed. 05/08/18   [provider]  ALPRAZolam Duanne Moron) 0.25 MG tablet Take 0.5 mg by mouth 3 (three) times daily as needed for anxiety.     [provider]  Calcium Carbonate-Vitamin D (CALTRATE 600+D PO) Take 600 mg by mouth daily.    [provider]  FLUoxetine (PROZAC) 20 MG capsule Take 20 mg by mouth daily.      [provider]    Fluticasone-Umeclidin-Vilant (TRELEGY ELLIPTA) 100-62.5-25 MCG/INH AEPB Inhale 1 puff into the lungs daily. 01/25/19   Rigoberto Noel, MD  levalbuterol Penne Lash) 1.25 MG/3ML nebulizer solution  01/03/19   [provider]  lidocaine (LIDODERM) 5 % Place 1 patch onto the skin daily. Remove & Discard patch within 12 hours. Wait 12 hours before applying a new patch. 06/15/20   Lorin Glass, PA-C  methocarbamol (ROBAXIN) 500 MG tablet Take 1 tablet (500 mg total) by mouth every 8 (eight) hours as needed for muscle spasms. 06/15/20   Lorin Glass, PA-C  Omega-3 Fatty Acids (FISH OIL) 1000 MG CAPS Take 1,000 mg by mouth daily.    [provider]  polycarbophil (FIBERCON) 625 MG tablet Take 1,250 mg by mouth daily.     [provider]  Roflumilast (DALIRESP) 250 MCG TABS Take 250 mcg by mouth daily. 01/25/19   Rigoberto Noel, MD  TRELEGY ELLIPTA 100-62.5-25 MCG/INH AEPB INHALE ONE PUFF INTO THE LUNGS DAILY. 12/17/19   Rigoberto Noel, MD    Allergies    Patient has no known allergies.  Review of Systems   Review of Systems  Constitutional: Negative for chills and fever.  Respiratory: Negative for cough and shortness of breath.   Cardiovascular: Negative for chest pain.  Gastrointestinal: Negative for abdominal pain.  Genitourinary: Negative for difficulty urinating and dysuria.  Musculoskeletal: Negative for back pain and neck pain.       Pain in right hip/buttock.   Neurological: Negative for weakness (Motion is limited by pain.  ) and numbness (Decreased sensation in toes of right foot. ).  All other systems reviewed and are negative.   Physical Exam Updated Vital Signs BP (!) 148/116 (BP Location: Right Arm)    Pulse 93    Temp 98.8 F (37.1 C) (Oral)    Resp 18    Ht 5\' 3"  (1.6 m)    Wt 45.4 kg    SpO2 95%    BMI 17.71 kg/m   Physical Exam Vitals and nursing note reviewed.  Constitutional:      General: She is not in acute distress.    Appearance:  She is well-developed. She is not diaphoretic.  HENT:     Head: Normocephalic and atraumatic.  Eyes:     General: No scleral icterus.       Right eye: No discharge.        Left eye: No discharge.     Conjunctiva/sclera: Conjunctivae normal.  Cardiovascular:     Rate and Rhythm: Normal rate and regular rhythm.     Pulses: Normal pulses.     Comments: 2+ DP/PT pulses bilaterally. Pulmonary:  Effort: Pulmonary effort is normal. No respiratory distress.     Breath sounds: No stridor.  Abdominal:     General: There is no distension.     Tenderness: There is no abdominal tenderness.  Musculoskeletal:        General: No deformity.     Cervical back: Normal range of motion.     Comments: No midline L spine TTP step-offs, or deformities.  There is mild pulling in the right buttock with range of motion of the right leg however internal/external rolling of the legs, flexion and extension at knee and hip does not cause significant/severe pain.  This is somewhat limited as patient has been laying with an ice pack over the painful area.  There is localized tenderness to palpation over the piriformis muscle on the right side.  Palpation here both recreates and exacerbates her reported pain.  No pain with compression of the pelvis.  Skin:    General: Skin is warm and dry.  Neurological:     Mental Status: She is alert.     Motor: No abnormal muscle tone.     Comments: Mild subjective decrease sensation to light touch over the toes on the right foot.  However otherwise she is neurologically intact.  Ankle dorsiflexion and plantar flexion strength is 5/5 bilaterally.  Psychiatric:        Mood and Affect: Mood normal.        Behavior: Behavior normal.     ED Results / Procedures / Treatments   Labs (all labs ordered are listed, but only abnormal results are displayed) Labs Reviewed - No data to display  EKG None  Radiology No results found.  Procedures Procedures (including critical  care time)  Medications Ordered in ED Medications  lidocaine (LIDODERM) 5 % 1 patch (has no administration in time range)    ED Course  I have reviewed the triage vital signs and the nursing notes.  Pertinent labs & imaging results that were available during my care of the patient were reviewed by me and considered in my medical decision making (see chart for details).  Clinical Course as of Jun 15 1504  Mon Jun 15, 2624  [id number] 69 year old female complaining of nontraumatic right hip pain going on for over a week.  Worse with bearing weight.  Has seen orthopedics and been put on steroids without any improvement.  Has an MRI pending.  She is neurovascular intact here.  We will try to adjust her medications for more pain relief.   [MB]    Clinical Course User Index [MB] Hayden Rasmussen, MD   MDM Rules/Calculators/A&P                         Patient is a 69 year old woman who presents today for evaluation of 2 weeks of pain in her right sided buttock.  She has had x-rays when she was seen by orthopedists as an outpatient.  These are not visible to me for review today however she reports there was no fractures.  She did not have a direct injury or blow, we did discuss role of repeat imaging/CT scan to evaluate for possible occult fracture however suspicion of this is pretty low given the low force mechanism.  Additionally if she had a occult fracture I would expect significant pain with range of motion.  She has an MRI already scheduled in 3 days which would provide a more detailed evaluation, after discussions with patient will  defer imaging in the ER for already scheduled MRI.  She does not have any evidence of cauda equina syndrome.  She does have difficulty getting to the bathroom due to her pain however is unable to fully empty her bladder.  Clinically her pain appears to be consistent with piriformis syndrome.  She is not taking any muscle relaxers, will add Robaxin 3 times daily as needed.   Additionally offered lidocaine patches to try and help with pain.  We discussed the importance of moving as I suspect her symptoms are being exacerbated by her lack of physical movement and activity.  Patient was seen as a shared visit with Dr. Melina Copa.  Return precautions were discussed with patient who states their understanding.  At the time of discharge patient denied any unaddressed complaints or concerns.  Patient is agreeable for discharge home.  Note: Portions of this report may have been transcribed using voice recognition software. Every effort was made to ensure accuracy; however, inadvertent computerized transcription errors may be present  Final Clinical Impression(s) / ED Diagnoses Final diagnoses:  Right hip pain    Rx / DC Orders ED Discharge Orders         Ordered    lidocaine (LIDODERM) 5 %  Every 24 hours     Discontinue  Reprint     06/15/20 1448    methocarbamol (ROBAXIN) 500 MG tablet  Every 8 hours PRN     Discontinue  Reprint     06/15/20 1448           Lorin Glass, PA-C 06/15/20 1505    Hayden Rasmussen, MD 06/15/20 [phone]

## 2020-06-15 NOTE — ED Triage Notes (Signed)
Pain in right hip for over a week, unable to bear weight

## 2021-01-18 ENCOUNTER — Other Ambulatory Visit (HOSPITAL_COMMUNITY): Payer: Self-pay | Admitting: Family Medicine

## 2021-01-18 DIAGNOSIS — Z1231 Encounter for screening mammogram for malignant neoplasm of breast: Secondary | ICD-10-CM

## 2021-01-27 ENCOUNTER — Ambulatory Visit (HOSPITAL_COMMUNITY)
Admission: RE | Admit: 2021-01-27 | Discharge: 2021-01-27 | Disposition: A | Discharge status: Home / Self Care | Payer: Medicare HMO | Source: Ambulatory Visit | Attending: Family Medicine | Admitting: Family Medicine

## 2021-01-27 DIAGNOSIS — Z1231 Encounter for screening mammogram for malignant neoplasm of breast: Secondary | ICD-10-CM | POA: Diagnosis not present

## 2021-03-29 ENCOUNTER — Telehealth: Payer: Self-pay | Admitting: Pulmonary Disease

## 2021-03-29 NOTE — Telephone Encounter (Signed)
Called and spoke with pt who said she has had increased SOB that has been worse over the last month. Stated to pt that since we have not seen her since 01/2019, we need to get her in for a visit and she verbalized understanding. OV scheduled for pt with Dr. Elsworth Soho Wed. 5/18 and made sure pt was aware that it was at [facility] office. Nothing further needed.

## 2021-03-31 ENCOUNTER — Other Ambulatory Visit: Payer: Self-pay

## 2021-03-31 ENCOUNTER — Encounter: Payer: Self-pay | Admitting: Pulmonary Disease

## 2021-03-31 ENCOUNTER — Ambulatory Visit (INDEPENDENT_AMBULATORY_CARE_PROVIDER_SITE_OTHER): Payer: Medicare HMO

## 2021-03-31 ENCOUNTER — Ambulatory Visit: Payer: Medicare HMO | Admitting: Pulmonary Disease

## 2021-03-31 VITALS — BP 136/84 | HR 82 | Temp 98.1°F | Ht 64.0 in | Wt 101.4 lb

## 2021-03-31 DIAGNOSIS — J9611 Chronic respiratory failure with hypoxia: Secondary | ICD-10-CM

## 2021-03-31 DIAGNOSIS — J449 Chronic obstructive pulmonary disease, unspecified: Secondary | ICD-10-CM | POA: Diagnosis not present

## 2021-03-31 DIAGNOSIS — J432 Centrilobular emphysema: Secondary | ICD-10-CM

## 2021-03-31 NOTE — Patient Instructions (Signed)
CXR today Schedule pFTs  Ambulatory sat Stay on trelegy daily  Referral to pulm rehab @ [facility]

## 2021-03-31 NOTE — Assessment & Plan Note (Signed)
Schedule PFTs. She will benefit from center-based pulmonary rehab program . She will continue on Trelegy in the meantime

## 2021-03-31 NOTE — Assessment & Plan Note (Signed)
She does desaturate on walking indicating poor lung function but not to the point of requiring oxygen. She has cold hands and we asked her to warm up on her hands before checking pulse oximeter

## 2021-03-31 NOTE — Progress Notes (Signed)
   Subjective:    Patient ID: Brittany Miranda, female    DOB: 26-Mar-1951, 70 y.o.   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 Initial office visit 01/2019 , started on Trelegy. She was provided nocturnal oxygen after hospital visit for an exacerbation which she is now returned. She reports increased dyspnea and admits to sedentary lifestyle over the last 2 years.  She had an accident and broke her sacrum 05/2020 and attributes to being sedentary because of this. She is compliant with Trelegy, uses albuterol only on an as-needed basis. She denies frequent chest colds, wheezing or coughing.  She has noted her oxygen saturation to fall as low as 70% on occasion  Chest x-ray independently reviewed, shows hyperinflation but appears clear of infiltrates. On ambulation heart rate increased from 80-1 03 and oxygen saturation dropped from 96 to 91% on 2 laps   Review of Systems neg for any significant sore throat, dysphagia, itching, sneezing, nasal congestion or excess/ purulent secretions, fever, chills, sweats, unintended wt loss, pleuritic or exertional cp, hempoptysis, orthopnea pnd or change in chronic leg swelling. Also denies presyncope, palpitations, heartburn, abdominal pain, nausea, vomiting, diarrhea or change in bowel or urinary habits, dysuria,hematuria, rash, arthralgias, visual complaints, headache, numbness weakness or ataxia.     Objective:   Physical Exam  Gen. Pleasant, thin, frail, in no distress ENT - no thrush, no pallor/icterus,no post nasal drip Neck: No JVD, no thyromegaly, no carotid bruits Lungs: no use of accessory muscles, no dullness to percussion, decreased without rales or rhonchi  Cardiovascular: Rhythm regular, heart sounds  normal, no murmurs or gallops, no peripheral edema Musculoskeletal: No deformities, no cyanosis or clubbing        Assessment & Plan:

## 2021-04-05 ENCOUNTER — Telehealth: Payer: Self-pay | Admitting: Pulmonary Disease

## 2021-04-05 DIAGNOSIS — J189 Pneumonia, unspecified organism: Secondary | ICD-10-CM

## 2021-04-05 MED ORDER — AMOXICILLIN-POT CLAVULANATE 875-125 MG PO TABS: 1.0000 | ORAL_TABLET | Freq: Two times a day (BID) | ORAL | 0 refills | Status: DC

## 2021-04-05 NOTE — Progress Notes (Signed)
Called and left message on voicemail to please return phone call. Contact number provided. 

## 2021-04-05 NOTE — Telephone Encounter (Signed)
Chest x-ray shows new patchy infiltrates both lung bases We will treat for infection/pneumonia with Augmentin 875 twice daily for 7 days Repeat chest x-ray in 1 month at her follow-up visit   I have called and spoke with pt and she is aware of results per RA.  appt scheduled for her to see RA in [facility] on 7/5.  Pt is aware that her cxr will be done at that time.  Nothing further is needed.

## 2021-04-06 ENCOUNTER — Other Ambulatory Visit (HOSPITAL_COMMUNITY)
Admission: RE | Admit: 2021-04-06 | Discharge: 2021-04-06 | Disposition: A | Discharge status: Home / Self Care | Payer: Medicare HMO | Source: Ambulatory Visit | Attending: Pulmonary Disease | Admitting: Pulmonary Disease

## 2021-04-06 ENCOUNTER — Other Ambulatory Visit: Payer: Self-pay

## 2021-04-06 DIAGNOSIS — Z20822 Contact with and (suspected) exposure to covid-19: Secondary | ICD-10-CM | POA: Insufficient documentation

## 2021-04-06 DIAGNOSIS — Z01812 Encounter for preprocedural laboratory examination: Secondary | ICD-10-CM | POA: Insufficient documentation

## 2021-04-06 LAB — SARS CORONAVIRUS 2 (TAT 6-24 HRS): SARS Coronavirus 2: NEGATIVE

## 2021-04-09 ENCOUNTER — Ambulatory Visit (HOSPITAL_COMMUNITY)
Admission: RE | Admit: 2021-04-09 | Discharge: 2021-04-09 | Disposition: A | Discharge status: Home / Self Care | Payer: Medicare HMO | Source: Ambulatory Visit | Attending: Pulmonary Disease | Admitting: Pulmonary Disease

## 2021-04-09 ENCOUNTER — Other Ambulatory Visit: Payer: Self-pay

## 2021-04-09 DIAGNOSIS — J432 Centrilobular emphysema: Secondary | ICD-10-CM

## 2021-04-09 LAB — PULMONARY FUNCTION TEST
DL/VA % pred: 39 %
DL/VA: 1.62 ml/min/mmHg/L
DLCO unc % pred: 30 %
DLCO unc: 5.83 ml/min/mmHg
FEF 25-75 Post: 0.37 L/sec
FEF 25-75 Pre: 0.34 L/sec
FEF2575-%Change-Post: 8 %
FEF2575-%Pred-Post: 19 %
FEF2575-%Pred-Pre: 18 %
FEV1-%Change-Post: 4 %
FEV1-%Pred-Post: 51 %
FEV1-%Pred-Pre: 49 %
FEV1-Post: 1.15 L
FEV1-Pre: 1.1 L
FEV1FVC-%Change-Post: 8 %
FEV1FVC-%Pred-Pre: 58 %
FEV6-%Change-Post: 2 %
FEV6-%Pred-Post: 76 %
FEV6-%Pred-Pre: 74 %
FEV6-Post: 2.16 L
FEV6-Pre: 2.12 L
FEV6FVC-%Change-Post: 5 %
FEV6FVC-%Pred-Post: 94 %
FEV6FVC-%Pred-Pre: 89 %
FVC-%Change-Post: -3 %
FVC-%Pred-Post: 80 %
FVC-%Pred-Pre: 83 %
FVC-Post: 2.39 L
FVC-Pre: 2.47 L
Post FEV1/FVC ratio: 48 %
Post FEV6/FVC ratio: 90 %
Pre FEV1/FVC ratio: 44 %
Pre FEV6/FVC Ratio: 86 %
RV % pred: 105 %
RV: 2.33 L
TLC % pred: 96 %
TLC: 4.86 L

## 2021-04-09 MED ORDER — ALBUTEROL SULFATE (2.5 MG/3ML) 0.083% IN NEBU
2.5000 mg | INHALATION_SOLUTION | Freq: Once | RESPIRATORY_TRACT | Status: AC
  Administered 2021-04-09: 2.5 mg via RESPIRATORY_TRACT

## 2021-04-16 NOTE — Progress Notes (Signed)
Called and went over PFT results and recommendations per Dr Elsworth Soho with patient. All questions answered and patient expressed full understanding of results and recommendations. Confirmed upcoming scheduled office visit with Dr Elsworth Soho on 05/18/21 at 9:15am at the [facility] office. Nothing further needed.

## 2021-04-22 ENCOUNTER — Encounter (HOSPITAL_COMMUNITY)
Admission: RE | Admit: 2021-04-22 | Discharge: 2021-04-22 | Disposition: A | Discharge status: Home / Self Care | Payer: Medicare HMO | Source: Ambulatory Visit | Attending: Pulmonary Disease | Admitting: Pulmonary Disease

## 2021-04-22 ENCOUNTER — Encounter (HOSPITAL_COMMUNITY): Payer: Self-pay

## 2021-04-22 ENCOUNTER — Other Ambulatory Visit: Payer: Self-pay

## 2021-04-22 VITALS — BP 122/84 | HR 80 | Ht 64.0 in | Wt 97.0 lb

## 2021-04-22 DIAGNOSIS — J432 Centrilobular emphysema: Secondary | ICD-10-CM | POA: Insufficient documentation

## 2021-04-22 NOTE — Progress Notes (Signed)
Pulmonary Individual Treatment Plan  Patient Details  Name: Brittany Miranda MRN: [id number] Date of Birth: [birth year] Referring Provider:   Flowsheet Row PULMONARY REHAB OTHER RESP ORIENTATION from 04/22/2021 in [place]  Referring Provider Dr. Elsworth Soho       Initial Encounter Date:  Flowsheet Row PULMONARY REHAB OTHER RESP ORIENTATION from 04/22/2021 in [place]  Date 04/22/21       Visit Diagnosis: Centrilobular emphysema ([place])  Patient's Home Medications on Admission:   Current Outpatient Medications:    albuterol (PROVENTIL HFA;VENTOLIN HFA) 108 (90 Base) MCG/ACT inhaler, Inhale 1 puff into the lungs every 6 (six) hours as needed., Disp: , Rfl:    ALPRAZolam (XANAX) 0.25 MG tablet, Take 0.5 mg by mouth 3 (three) times daily as needed for anxiety., Disp: , Rfl:    amoxicillin-clavulanate (AUGMENTIN) 875-125 MG tablet, Take 1 tablet by mouth 2 (two) times daily., Disp: 14 tablet, Rfl: 0   Calcium Carbonate-Vitamin D (CALTRATE 600+D PO), Take 600 mg by mouth daily., Disp: , Rfl:    DULoxetine (CYMBALTA) 30 MG capsule, Take 30 mg by mouth daily., Disp: , Rfl:    ipratropium (ATROVENT) 0.02 % nebulizer solution, Take 0.5 mg by nebulization 4 (four) times daily., Disp: , Rfl:    Omega-3 Fatty Acids (FISH OIL) 1000 MG CAPS, Take 1,000 mg by mouth daily., Disp: , Rfl:    polycarbophil (FIBERCON) 625 MG tablet, Take 1,250 mg by mouth daily., Disp: , Rfl:   Past Medical History: Past Medical History:  Diagnosis Date   Anxiety    Colon polyps    adenomas in remote past    COPD (chronic obstructive pulmonary disease) ([place])    Depression    Osteopenia     Tobacco Use: Social History   Tobacco Use  Smoking Status Former   Packs/day: 1.00   Years: 30.00   Pack years: 30.00   Types: Cigarettes   Quit date: 04/04/1998   Years since quitting: 23.0  Smokeless Tobacco Never    Labs: Recent Review Flowsheet Data   There is no flowsheet  data to display.     Capillary Blood Glucose: No results found for: GLUCAP   Pulmonary Assessment Scores:  Pulmonary Assessment Scores     Row Name 04/22/21 0821         ADL UCSD   SOB Score total 19           CAT Score     CAT Score 3           mMRC Score     mMRC Score 1            UCSD: Self-administered rating of dyspnea associated with activities of daily living (ADLs) 6-point scale (0 = "not at all" to 5 = "maximal or unable to do because of breathlessness")  Scoring Scores range from 0 to 120.  Minimally important difference is 5 units  CAT: CAT can identify the health impairment of COPD patients and is better correlated with disease progression.  CAT has a scoring range of zero to 40. The CAT score is classified into four groups of low (less than 10), medium (10 - 20), high (21-30) and very high (31-40) based on the impact level of disease on health status. A CAT score over 10 suggests significant symptoms.  A worsening CAT score could be explained by an exacerbation, poor medication adherence, poor inhaler technique, or progression of COPD or comorbid conditions.  CAT MCID is  2 points  mMRC: mMRC (Modified Medical Research Council) Dyspnea Scale is used to assess the degree of baseline functional disability in patients of respiratory disease due to dyspnea. No minimal important difference is established. A decrease in score of 1 point or greater is considered a positive change.   Pulmonary Function Assessment:   Exercise Target Goals: Exercise Program Goal: Individual exercise prescription set using results from initial 6 min walk test and THRR while considering  patient's activity barriers and safety.   Exercise Prescription Goal: Initial exercise prescription builds to 30-45 minutes a day of aerobic activity, 2-3 days per week.  Home exercise guidelines will be given to patient during program as part of exercise prescription that the participant will  acknowledge.  Activity Barriers & Risk Stratification:  Activity Barriers & Cardiac Risk Stratification - 04/22/21 0822       Activity Barriers & Cardiac Risk Stratification   Activity Barriers Back Problems;Shortness of Breath;History of Falls    Cardiac Risk Stratification Low             6 Minute Walk:  6 Minute Walk     Row Name 04/22/21 0937         6 Minute Walk   Phase Initial     Distance 1000 feet     Walk Time 6 minutes     # of Rest Breaks 0     MPH 1.89     METS 2.78     RPE 9     Perceived Dyspnea  9     VO2 Peak 9.71     Symptoms No     Resting HR 80 bpm     Resting BP 122/84     Resting Oxygen Saturation  95 %     Exercise Oxygen Saturation  during 6 min walk 88 %     Max Ex. HR 97 bpm     Max Ex. BP 126/86     2 Minute Post BP 118/86           Interval HR     1 Minute HR 87     2 Minute HR 92     3 Minute HR 89     4 Minute HR 95     5 Minute HR 92     6 Minute HR 97     2 Minute Post HR 82     Interval Heart Rate? Yes           Interval Oxygen     Interval Oxygen? Yes     Baseline Oxygen Saturation % 95 %     1 Minute Oxygen Saturation % 90 %     1 Minute Liters of Oxygen 0 L     2 Minute Oxygen Saturation % 91 %     2 Minute Liters of Oxygen 0 L     3 Minute Oxygen Saturation % 90 %     3 Minute Liters of Oxygen 0 L     4 Minute Oxygen Saturation % 91 %     4 Minute Liters of Oxygen 0 L     5 Minute Oxygen Saturation % 89 %     5 Minute Liters of Oxygen 0 L     6 Minute Oxygen Saturation % 88 %     6 Minute Liters of Oxygen 0 L     2 Minute Post Oxygen Saturation % 91 %     2 Minute Post Liters  of Oxygen 0 L             Oxygen Initial Assessment:  Oxygen Initial Assessment - 04/22/21 0937       Initial 6 min Walk   Oxygen Used None      Program Oxygen Prescription   Program Oxygen Prescription None      Intervention   Short Term Goals To learn and exhibit compliance with exercise, home and travel O2  prescription;To learn and understand importance of monitoring SPO2 with pulse oximeter and demonstrate accurate use of the pulse oximeter.;To learn and understand importance of maintaining oxygen saturations>88%;To learn and demonstrate proper pursed lip breathing techniques or other breathing techniques.     Long  Term Goals Exhibits compliance with exercise, home  and travel O2 prescription;Verbalizes importance of monitoring SPO2 with pulse oximeter and return demonstration;Maintenance of O2 saturations>88%;Exhibits proper breathing techniques, such as pursed lip breathing or other method taught during program session;Compliance with respiratory medication             Oxygen Re-Evaluation:   Oxygen Discharge (Final Oxygen Re-Evaluation):   Initial Exercise Prescription:  Initial Exercise Prescription - 04/22/21 0900       Date of Initial Exercise RX and Referring Provider   Date 04/22/21    Referring Provider Dr. Elsworth Soho    Expected Discharge Date 09/02/21      Treadmill   MPH 1.8    Grade 0    Minutes 17      NuStep   Level 1    SPM 80    Minutes 22      Prescription Details   Frequency (times per week) 2    Duration Progress to 30 minutes of continuous aerobic without signs/symptoms of physical distress      Intensity   THRR 40-80% of Max Heartrate 60-119    Ratings of Perceived Exertion 11-13    Perceived Dyspnea 0-4      Resistance Training   Training Prescription Yes    Weight 2 lbs    Reps 10-15             Perform Capillary Blood Glucose checks as needed.  Exercise Prescription Changes:   Exercise Comments:   Exercise Goals and Review:   Exercise Goals     Row Name 04/22/21 0941             Exercise Goals   Increase Physical Activity Yes       Intervention Provide advice, education, support and counseling about physical activity/exercise needs.;Develop an individualized exercise prescription for aerobic and resistive training based on  initial evaluation findings, risk stratification, comorbidities and participant's personal goals.       Expected Outcomes Short Term: Attend rehab on a regular basis to increase amount of physical activity.;Long Term: Add in home exercise to make exercise part of routine and to increase amount of physical activity.;Long Term: Exercising regularly at least 3-5 days a week.       Increase Strength and Stamina Yes       Intervention Provide advice, education, support and counseling about physical activity/exercise needs.;Develop an individualized exercise prescription for aerobic and resistive training based on initial evaluation findings, risk stratification, comorbidities and participant's personal goals.       Expected Outcomes Short Term: Increase workloads from initial exercise prescription for resistance, speed, and METs.;Short Term: Perform resistance training exercises routinely during rehab and add in resistance training at home;Long Term: Improve cardiorespiratory fitness, muscular endurance and strength as measured  by increased METs and functional capacity (6MWT)       Able to understand and use rate of perceived exertion (RPE) scale Yes       Intervention Provide education and explanation on how to use RPE scale       Expected Outcomes Short Term: Able to use RPE daily in rehab to express subjective intensity level;Long Term:  Able to use RPE to guide intensity level when exercising independently       Able to understand and use Dyspnea scale Yes       Intervention Provide education and explanation on how to use Dyspnea scale       Expected Outcomes Short Term: Able to use Dyspnea scale daily in rehab to express subjective sense of shortness of breath during exertion;Long Term: Able to use Dyspnea scale to guide intensity level when exercising independently       Knowledge and understanding of Target Heart Rate Range (THRR) Yes       Intervention Provide education and explanation of THRR  including how the numbers were predicted and where they are located for reference       Expected Outcomes Short Term: Able to state/look up THRR;Long Term: Able to use THRR to govern intensity when exercising independently;Short Term: Able to use daily as guideline for intensity in rehab       Understanding of Exercise Prescription Yes       Intervention Provide education, explanation, and written materials on patient's individual exercise prescription       Expected Outcomes Short Term: Able to explain program exercise prescription;Long Term: Able to explain home exercise prescription to exercise independently                Exercise Goals Re-Evaluation :   Discharge Exercise Prescription (Final Exercise Prescription Changes):   Nutrition:  Target Goals: Understanding of nutrition guidelines, daily intake of sodium 1500mg , cholesterol 200mg , calories 30% from fat and 7% or less from saturated fats, daily to have 5 or more servings of fruits and vegetables.  Biometrics:  Pre Biometrics - 04/22/21 0942       Pre Biometrics   Height 5\' 4"  (1.626 m)    Weight 44 kg    Waist Circumference 29 inches    Hip Circumference 33 inches    Waist to Hip Ratio 0.88 %    BMI (Calculated) 16.64    Triceps Skinfold 5 mm    % Body Fat 22.9 %    Grip Strength 23.4 kg    Flexibility 0 in    Single Leg Stand 12.88 seconds              Nutrition Therapy Plan and Nutrition Goals:   Nutrition Assessments:  MEDIFICTS Score Key: ?70 Need to make dietary changes  40-70 Heart Healthy Diet ? 40 Therapeutic Level Cholesterol Diet   Picture Your Plate Scores: <95 Unhealthy dietary pattern with much room for improvement. 41-50 Dietary pattern unlikely to meet recommendations for good health and room for improvement. 51-60 More healthful dietary pattern, with some room for improvement.  >60 Healthy dietary pattern, although there may be some specific behaviors that could be improved.     Nutrition Goals Re-Evaluation:   Nutrition Goals Discharge (Final Nutrition Goals Re-Evaluation):   Psychosocial: Target Goals: Acknowledge presence or absence of significant depression and/or stress, maximize coping skills, provide positive support system. Participant is able to verbalize types and ability to use techniques and skills needed for reducing stress and depression.  Initial Review & Psychosocial Screening:  Initial Psych Review & Screening - 04/22/21 0823       Initial Review   Current issues with Current Depression;Current Anxiety/Panic      Family Dynamics   Good Support System? Yes    Comments Her brother and his family support her. She has a boyfriend who has helped her out through depression in the past. She has several friends who she hangs out with a lot.      Barriers   Psychosocial barriers to participate in program There are no identifiable barriers or psychosocial needs.      Screening Interventions   Interventions Encouraged to exercise    Expected Outcomes Short Term goal: Identification and review with participant of any Quality of Life or Depression concerns found by scoring the questionnaire.             Quality of Life Scores:  Scores of 19 and below usually indicate a poorer quality of life in these areas.  A difference of  2-3 points is a clinically meaningful difference.  A difference of 2-3 points in the total score of the Quality of Life Index has been associated with significant improvement in overall quality of life, self-image, physical symptoms, and general health in studies assessing change in quality of life.   PHQ-9: Recent Review Flowsheet Data     Depression screen [facility] 2/9 04/22/2021   Decreased Interest 0   Down, Depressed, Hopeless 0   PHQ - 2 Score 0   Altered sleeping 0   Tired, decreased energy 0   Change in appetite 0   Feeling bad or failure about yourself  0   Trouble concentrating 0   Moving slowly or  fidgety/restless 0   Suicidal thoughts 0   PHQ-9 Score 0   Difficult doing work/chores Not difficult at all      Interpretation of Total Score  Total Score Depression Severity:  1-4 = Minimal depression, 5-9 = Mild depression, 10-14 = Moderate depression, 15-19 = Moderately severe depression, 20-27 = Severe depression   Psychosocial Evaluation and Intervention:  Psychosocial Evaluation - 04/22/21 0946       Psychosocial Evaluation & Interventions   Interventions Encouraged to exercise with the program and follow exercise prescription    Comments Pt has no identifiable barriers to completing pulmonary rehab. She has been diagnosed with depression and anxiety. She takes xanax and cymbalta for this, and reports that she feels like this helps. She reports that a few months ago she was feeling very down and depressed following the death of her sister. She went to live with her boyfriend for 4 months, and came home a few weeks ago. She reports that she is in good spirits now and has coped with all of her sadness. She scored a 0 on her PHQ-9. She states that she has a good support system with her brother and his family. She has a boyfriend of 3 years who supports her as well. She has a large circle of friends to support her. Her goals while in the program are to decrease her SOB with ADL's. She has a positive outlook and is excited to start the program.    Expected Outcomes The patient will continue to not have any psychosocial issues. She will not have anymore depressive episodes.    Continue Psychosocial Services  No Follow up required             Psychosocial Re-Evaluation:   Psychosocial Discharge (Final  Psychosocial Re-Evaluation):    Education: Education Goals: Education classes will be provided on a weekly basis, covering required topics. Participant will state understanding/return demonstration of topics presented.  Learning Barriers/Preferences:  Learning Barriers/Preferences  - 04/22/21 0827       Learning Barriers/Preferences   Learning Barriers None    Learning Preferences Skilled Demonstration             Education Topics: How Lungs Work and Diseases: - Discuss the anatomy of the lungs and diseases that can affect the lungs, such as COPD.   Exercise: -Discuss the importance of exercise, FITT principles of exercise, normal and abnormal responses to exercise, and how to exercise safely.   Environmental Irritants: -Discuss types of environmental irritants and how to limit exposure to environmental irritants.   Meds/Inhalers and oxygen: - Discuss respiratory medications, definition of an inhaler and oxygen, and the proper way to use an inhaler and oxygen.   Energy Saving Techniques: - Discuss methods to conserve energy and decrease shortness of breath when performing activities of daily living.    Bronchial Hygiene / Breathing Techniques: - Discuss breathing mechanics, pursed-lip breathing technique,  proper posture, effective ways to clear airways, and other functional breathing techniques   Cleaning Equipment: - Provides group verbal and written instruction about the health risks of elevated stress, cause of high stress, and healthy ways to reduce stress.   Nutrition I: Fats: - Discuss the types of cholesterol, what cholesterol does to the body, and how cholesterol levels can be controlled.   Nutrition II: Labels: -Discuss the different components of food labels and how to read food labels.   Respiratory Infections: - Discuss the signs and symptoms of respiratory infections, ways to prevent respiratory infections, and the importance of seeking medical treatment when having a respiratory infection.   Stress I: Signs and Symptoms: - Discuss the causes of stress, how stress may lead to anxiety and depression, and ways to limit stress.   Stress II: Relaxation: -Discuss relaxation techniques to limit stress.   Oxygen for  Home/Travel: - Discuss how to prepare for travel when on oxygen and proper ways to transport and store oxygen to ensure safety.   Knowledge Questionnaire Score:  Knowledge Questionnaire Score - 04/22/21 0828       Knowledge Questionnaire Score   Pre Score 14/18             Core Components/Risk Factors/Patient Goals at Admission:  Personal Goals and Risk Factors at Admission - 04/22/21 0831       Core Components/Risk Factors/Patient Goals on Admission   Improve shortness of breath with ADL's Yes    Intervention Provide education, individualized exercise plan and daily activity instruction to help decrease symptoms of SOB with activities of daily living.    Expected Outcomes Short Term: Improve cardiorespiratory fitness to achieve a reduction of symptoms when performing ADLs;Long Term: Be able to perform more ADLs without symptoms or delay the onset of symptoms             Core Components/Risk Factors/Patient Goals Review:    Core Components/Risk Factors/Patient Goals at Discharge (Final Review):    ITP Comments:   Comments: Patient arrived for 1st visit/orientation/education at 0800. Patient was referred to [facility] by Dr. Elsworth Soho due to centrilobular emphysema (J43.2). During orientation advised patient on arrival and appointment times what to wear, what to do before, during and after exercise. Reviewed attendance and class policy.  Pt is scheduled to return Pulmonary Rehab on 05/04/2021 at 1330.  Pt was advised to come to class 15 minutes before class starts.  Discussed RPE/Dpysnea scales. Patient participated in warm up stretches. Patient was able to complete 6 minute walk test. Patient was measured for the equipment. Discussed equipment safety with patient. Took patient pre-anthropometric measurements. Patient finished visit at [phone].

## 2021-04-23 DIAGNOSIS — J432 Centrilobular emphysema: Secondary | ICD-10-CM | POA: Diagnosis present

## 2021-05-04 ENCOUNTER — Other Ambulatory Visit: Payer: Self-pay

## 2021-05-04 ENCOUNTER — Encounter (HOSPITAL_COMMUNITY)
Admission: RE | Admit: 2021-05-04 | Discharge: 2021-05-04 | Disposition: A | Discharge status: Home / Self Care | Payer: Medicare HMO | Source: Ambulatory Visit | Attending: Pulmonary Disease | Admitting: Pulmonary Disease

## 2021-05-04 VITALS — Wt 95.0 lb

## 2021-05-04 DIAGNOSIS — J432 Centrilobular emphysema: Secondary | ICD-10-CM | POA: Diagnosis not present

## 2021-05-04 NOTE — Progress Notes (Signed)
Daily Session Note  Patient Details  Name: TANEASHA FUQUA MRN: [id number] Date of Birth: 1951-05-01 Referring Provider:   [place] from 04/22/2021 in [place]  Referring Provider Dr. Elsworth Soho       Encounter Date: 05/04/2021  Check In:  Session Check In - 05/04/21 1330       Check-In   Supervising physician immediately available to respond to emergencies [facility] MD immediately available    Physician(s) Dr. Harl Bowie    Location [facility]    Staff Present Geanie Cooley, RN;Dalton Kris Mouton, MS, ACSM-CEP, Exercise Physiologist    Virtual Visit No    Medication changes reported     No    Fall or balance concerns reported    No    Tobacco Cessation No Change    Warm-up and Cool-down Performed as group-led instruction    Resistance Training Performed Yes    VAD Patient? No    PAD/SET Patient? No      Pain Assessment   Currently in Pain? No/denies    Multiple Pain Sites No             Capillary Blood Glucose: No results found for this or any previous visit (from the past 24 hour(s)).    Social History   Tobacco Use  Smoking Status Former   Packs/day: 1.00   Years: 30.00   Pack years: 30.00   Types: Cigarettes   Quit date: 04/04/1998   Years since quitting: 23.0  Smokeless Tobacco Never    Goals Met:  Proper associated with RPD/PD & O2 Sat Independence with exercise equipment Using PLB without cueing & demonstrates good technique Exercise tolerated well No report of cardiac concerns or symptoms Strength training completed today  Goals Unmet:  Not Applicable  Comments: check out @ 2:30pm   Dr. Kathie Dike is Medical Director for [facility].

## 2021-05-05 NOTE — Progress Notes (Signed)
Pulmonary Individual Treatment Plan  Patient Details  Name: Brittany Miranda MRN: [id number] Date of Birth: Feb 11, 1951 Referring Provider:   Flowsheet Row PULMONARY REHAB OTHER RESP ORIENTATION from 04/22/2021 in [facility]  Referring Provider Dr. Elsworth Soho       Initial Encounter Date:  Flowsheet Row PULMONARY REHAB OTHER RESP ORIENTATION from 04/22/2021 in [facility]  Date 04/22/21       Visit Diagnosis: Centrilobular emphysema ([place])  Patient's Home Medications on Admission:   Current Outpatient Medications:    albuterol (PROVENTIL HFA;VENTOLIN HFA) 108 (90 Base) MCG/ACT inhaler, Inhale 1 puff into the lungs every 6 (six) hours as needed., Disp: , Rfl:    ALPRAZolam (XANAX) 0.25 MG tablet, Take 0.5 mg by mouth 3 (three) times daily as needed for anxiety., Disp: , Rfl:    amoxicillin-clavulanate (AUGMENTIN) 875-125 MG tablet, Take 1 tablet by mouth 2 (two) times daily., Disp: 14 tablet, Rfl: 0   Calcium Carbonate-Vitamin D (CALTRATE 600+D PO), Take 600 mg by mouth daily., Disp: , Rfl:    DULoxetine (CYMBALTA) 30 MG capsule, Take 30 mg by mouth daily., Disp: , Rfl:    ipratropium (ATROVENT) 0.02 % nebulizer solution, Take 0.5 mg by nebulization 4 (four) times daily., Disp: , Rfl:    Omega-3 Fatty Acids (FISH OIL) 1000 MG CAPS, Take 1,000 mg by mouth daily., Disp: , Rfl:    polycarbophil (FIBERCON) 625 MG tablet, Take 1,250 mg by mouth daily., Disp: , Rfl:   Past Medical History: Past Medical History:  Diagnosis Date   Anxiety    Colon polyps    adenomas in remote past    COPD (chronic obstructive pulmonary disease) ([place])    Depression    Osteopenia     Tobacco Use: Social History   Tobacco Use  Smoking Status Former   Packs/day: 1.00   Years: 30.00   Pack years: 30.00   Types: Cigarettes   Quit date: 04/04/1998   Years since quitting: 23.1  Smokeless Tobacco Never    Labs: Recent Review Flowsheet Data   There is no flowsheet  data to display.     Capillary Blood Glucose: No results found for: GLUCAP   Pulmonary Assessment Scores:  Pulmonary Assessment Scores     Row Name 04/22/21 0821         ADL UCSD   SOB Score total 19           CAT Score     CAT Score 3           mMRC Score     mMRC Score 1            UCSD: Self-administered rating of dyspnea associated with activities of daily living (ADLs) 6-point scale (0 = "not at all" to 5 = "maximal or unable to do because of breathlessness")  Scoring Scores range from 0 to 120.  Minimally important difference is 5 units  CAT: CAT can identify the health impairment of COPD patients and is better correlated with disease progression.  CAT has a scoring range of zero to 40. The CAT score is classified into four groups of low (less than 10), medium (10 - 20), high (21-30) and very high (31-40) based on the impact level of disease on health status. A CAT score over 10 suggests significant symptoms.  A worsening CAT score could be explained by an exacerbation, poor medication adherence, poor inhaler technique, or progression of COPD or comorbid conditions.  CAT MCID is  2 points  mMRC: mMRC (Modified Medical Research Council) Dyspnea Scale is used to assess the degree of baseline functional disability in patients of respiratory disease due to dyspnea. No minimal important difference is established. A decrease in score of 1 point or greater is considered a positive change.   Pulmonary Function Assessment:   Exercise Target Goals: Exercise Program Goal: Individual exercise prescription set using results from initial 6 min walk test and THRR while considering  patient's activity barriers and safety.   Exercise Prescription Goal: Initial exercise prescription builds to 30-45 minutes a day of aerobic activity, 2-3 days per week.  Home exercise guidelines will be given to patient during program as part of exercise prescription that the participant will  acknowledge.  Activity Barriers & Risk Stratification:  Activity Barriers & Cardiac Risk Stratification - 04/22/21 0822       Activity Barriers & Cardiac Risk Stratification   Activity Barriers Back Problems;Shortness of Breath;History of Falls    Cardiac Risk Stratification Low             6 Minute Walk:  6 Minute Walk     Row Name 04/22/21 0937         6 Minute Walk   Phase Initial     Distance 1000 feet     Walk Time 6 minutes     # of Rest Breaks 0     MPH 1.89     METS 2.78     RPE 9     Perceived Dyspnea  9     VO2 Peak 9.71     Symptoms No     Resting HR 80 bpm     Resting BP 122/84     Resting Oxygen Saturation  95 %     Exercise Oxygen Saturation  during 6 min walk 88 %     Max Ex. HR 97 bpm     Max Ex. BP 126/86     2 Minute Post BP 118/86           Interval HR     1 Minute HR 87     2 Minute HR 92     3 Minute HR 89     4 Minute HR 95     5 Minute HR 92     6 Minute HR 97     2 Minute Post HR 82     Interval Heart Rate? Yes           Interval Oxygen     Interval Oxygen? Yes     Baseline Oxygen Saturation % 95 %     1 Minute Oxygen Saturation % 90 %     1 Minute Liters of Oxygen 0 L     2 Minute Oxygen Saturation % 91 %     2 Minute Liters of Oxygen 0 L     3 Minute Oxygen Saturation % 90 %     3 Minute Liters of Oxygen 0 L     4 Minute Oxygen Saturation % 91 %     4 Minute Liters of Oxygen 0 L     5 Minute Oxygen Saturation % 89 %     5 Minute Liters of Oxygen 0 L     6 Minute Oxygen Saturation % 88 %     6 Minute Liters of Oxygen 0 L     2 Minute Post Oxygen Saturation % 91 %     2 Minute Post Liters  of Oxygen 0 L             Oxygen Initial Assessment:  Oxygen Initial Assessment - 04/22/21 0937       Initial 6 min Walk   Oxygen Used None      Program Oxygen Prescription   Program Oxygen Prescription None      Intervention   Short Term Goals To learn and exhibit compliance with exercise, home and travel O2  prescription;To learn and understand importance of monitoring SPO2 with pulse oximeter and demonstrate accurate use of the pulse oximeter.;To learn and understand importance of maintaining oxygen saturations>88%;To learn and demonstrate proper pursed lip breathing techniques or other breathing techniques.     Long  Term Goals Exhibits compliance with exercise, home  and travel O2 prescription;Verbalizes importance of monitoring SPO2 with pulse oximeter and return demonstration;Maintenance of O2 saturations>88%;Exhibits proper breathing techniques, such as pursed lip breathing or other method taught during program session;Compliance with respiratory medication             Oxygen Re-Evaluation:  Oxygen Re-Evaluation     Row Name 05/04/21 1530             Program Oxygen Prescription   Program Oxygen Prescription None               Home Oxygen     Home Oxygen Device None       Sleep Oxygen Prescription None       Home Exercise Oxygen Prescription None       Home Resting Oxygen Prescription None       Compliance with Home Oxygen Use Yes               Goals/Expected Outcomes     Short Term Goals To learn and exhibit compliance with exercise, home and travel O2 prescription;To learn and understand importance of monitoring SPO2 with pulse oximeter and demonstrate accurate use of the pulse oximeter.;To learn and understand importance of maintaining oxygen saturations>88%;To learn and demonstrate proper pursed lip breathing techniques or other breathing techniques.        Long  Term Goals Exhibits compliance with exercise, home  and travel O2 prescription;Verbalizes importance of monitoring SPO2 with pulse oximeter and return demonstration;Maintenance of O2 saturations>88%;Exhibits proper breathing techniques, such as pursed lip breathing or other method taught during program session;Compliance with respiratory medication       Goals/Expected Outcomes compliance               Oxygen  Discharge (Final Oxygen Re-Evaluation):  Oxygen Re-Evaluation - 05/04/21 1530       Program Oxygen Prescription   Program Oxygen Prescription None      Home Oxygen   Home Oxygen Device None    Sleep Oxygen Prescription None    Home Exercise Oxygen Prescription None    Home Resting Oxygen Prescription None    Compliance with Home Oxygen Use Yes      Goals/Expected Outcomes   Short Term Goals To learn and exhibit compliance with exercise, home and travel O2 prescription;To learn and understand importance of monitoring SPO2 with pulse oximeter and demonstrate accurate use of the pulse oximeter.;To learn and understand importance of maintaining oxygen saturations>88%;To learn and demonstrate proper pursed lip breathing techniques or other breathing techniques.     Long  Term Goals Exhibits compliance with exercise, home  and travel O2 prescription;Verbalizes importance of monitoring SPO2 with pulse oximeter and return demonstration;Maintenance of O2 saturations>88%;Exhibits proper breathing techniques, such as pursed lip  breathing or other method taught during program session;Compliance with respiratory medication    Goals/Expected Outcomes compliance             Initial Exercise Prescription:  Initial Exercise Prescription - 04/22/21 0900       Date of Initial Exercise RX and Referring Provider   Date 04/22/21    Referring Provider Dr. Elsworth Soho    Expected Discharge Date 09/02/21      Treadmill   MPH 1.8    Grade 0    Minutes 17      NuStep   Level 1    SPM 80    Minutes 22      Prescription Details   Frequency (times per week) 2    Duration Progress to 30 minutes of continuous aerobic without signs/symptoms of physical distress      Intensity   THRR 40-80% of Max Heartrate 60-119    Ratings of Perceived Exertion 11-13    Perceived Dyspnea 0-4      Resistance Training   Training Prescription Yes    Weight 2 lbs    Reps 10-15             Perform Capillary Blood  Glucose checks as needed.  Exercise Prescription Changes:   Exercise Prescription Changes     Row Name 05/04/21 1500             Response to Exercise   Blood Pressure (Admit) 142/78       Blood Pressure (Exercise) 138/80       Blood Pressure (Exit) 112/80       Heart Rate (Admit) 98 bpm       Heart Rate (Exercise) 105 bpm       Heart Rate (Exit) 92 bpm       Oxygen Saturation (Admit) 93 %       Oxygen Saturation (Exercise) 90 %       Oxygen Saturation (Exit) 95 %       Rating of Perceived Exertion (Exercise) 12       Perceived Dyspnea (Exercise) 11       Duration Continue with 30 min of aerobic exercise without signs/symptoms of physical distress.       Intensity THRR unchanged               Progression     Progression Continue to progress workloads to maintain intensity without signs/symptoms of physical distress.               Resistance Training     Training Prescription Yes       Weight 1 lbs       Reps 10-15               Treadmill     MPH 1.8       Grade 0       Minutes 22       METs 2.38               NuStep     Level 1       SPM 79       Minutes 17       METs 2               Exercise Comments:   Exercise Goals and Review:   Exercise Goals     Row Name 04/22/21 0941 05/04/21 1533           Exercise Goals   Increase Physical  Activity Yes Yes      Intervention Provide advice, education, support and counseling about physical activity/exercise needs.;Develop an individualized exercise prescription for aerobic and resistive training based on initial evaluation findings, risk stratification, comorbidities and participant's personal goals. Provide advice, education, support and counseling about physical activity/exercise needs.;Develop an individualized exercise prescription for aerobic and resistive training based on initial evaluation findings, risk stratification, comorbidities and participant's personal goals.      Expected Outcomes Short  Term: Attend rehab on a regular basis to increase amount of physical activity.;Long Term: Add in home exercise to make exercise part of routine and to increase amount of physical activity.;Long Term: Exercising regularly at least 3-5 days a week. Short Term: Attend rehab on a regular basis to increase amount of physical activity.;Long Term: Add in home exercise to make exercise part of routine and to increase amount of physical activity.;Long Term: Exercising regularly at least 3-5 days a week.      Increase Strength and Stamina Yes Yes      Intervention Provide advice, education, support and counseling about physical activity/exercise needs.;Develop an individualized exercise prescription for aerobic and resistive training based on initial evaluation findings, risk stratification, comorbidities and participant's personal goals. Provide advice, education, support and counseling about physical activity/exercise needs.;Develop an individualized exercise prescription for aerobic and resistive training based on initial evaluation findings, risk stratification, comorbidities and participant's personal goals.      Expected Outcomes Short Term: Increase workloads from initial exercise prescription for resistance, speed, and METs.;Short Term: Perform resistance training exercises routinely during rehab and add in resistance training at home;Long Term: Improve cardiorespiratory fitness, muscular endurance and strength as measured by increased METs and functional capacity (6MWT) Short Term: Increase workloads from initial exercise prescription for resistance, speed, and METs.;Short Term: Perform resistance training exercises routinely during rehab and add in resistance training at home;Long Term: Improve cardiorespiratory fitness, muscular endurance and strength as measured by increased METs and functional capacity (6MWT)      Able to understand and use rate of perceived exertion (RPE) scale Yes Yes      Intervention  Provide education and explanation on how to use RPE scale Provide education and explanation on how to use RPE scale      Expected Outcomes Short Term: Able to use RPE daily in rehab to express subjective intensity level;Long Term:  Able to use RPE to guide intensity level when exercising independently Short Term: Able to use RPE daily in rehab to express subjective intensity level;Long Term:  Able to use RPE to guide intensity level when exercising independently      Able to understand and use Dyspnea scale Yes Yes      Intervention Provide education and explanation on how to use Dyspnea scale Provide education and explanation on how to use Dyspnea scale      Expected Outcomes Short Term: Able to use Dyspnea scale daily in rehab to express subjective sense of shortness of breath during exertion;Long Term: Able to use Dyspnea scale to guide intensity level when exercising independently Short Term: Able to use Dyspnea scale daily in rehab to express subjective sense of shortness of breath during exertion;Long Term: Able to use Dyspnea scale to guide intensity level when exercising independently      Knowledge and understanding of Target Heart Rate Range (THRR) Yes Yes      Intervention Provide education and explanation of THRR including how the numbers were predicted and where they are located for reference Provide education and  explanation of THRR including how the numbers were predicted and where they are located for reference      Expected Outcomes Short Term: Able to state/look up THRR;Long Term: Able to use THRR to govern intensity when exercising independently;Short Term: Able to use daily as guideline for intensity in rehab Short Term: Able to state/look up THRR;Long Term: Able to use THRR to govern intensity when exercising independently;Short Term: Able to use daily as guideline for intensity in rehab      Understanding of Exercise Prescription Yes Yes      Intervention Provide education,  explanation, and written materials on patient's individual exercise prescription Provide education, explanation, and written materials on patient's individual exercise prescription      Expected Outcomes Short Term: Able to explain program exercise prescription;Long Term: Able to explain home exercise prescription to exercise independently Short Term: Able to explain program exercise prescription;Long Term: Able to explain home exercise prescription to exercise independently               Exercise Goals Re-Evaluation :  Exercise Goals Re-Evaluation     Row Name 05/04/21 1533             Exercise Goal Re-Evaluation   Exercise Goals Review Increase Physical Activity;Increase Strength and Stamina;Able to understand and use rate of perceived exertion (RPE) scale;Understanding of Exercise Prescription;Able to check pulse independently;Knowledge and understanding of Target Heart Rate Range (THRR)       Comments Pt has completed 1 exercise session. She is eager to participate in the program. She currently exercises at 2.0 METs on the stepper. Will continue to monitor and progress as able.       Expected Outcomes Through exercise at home and at rehab the patient will meet their goals.                Discharge Exercise Prescription (Final Exercise Prescription Changes):  Exercise Prescription Changes - 05/04/21 1500       Response to Exercise   Blood Pressure (Admit) 142/78    Blood Pressure (Exercise) 138/80    Blood Pressure (Exit) 112/80    Heart Rate (Admit) 98 bpm    Heart Rate (Exercise) 105 bpm    Heart Rate (Exit) 92 bpm    Oxygen Saturation (Admit) 93 %    Oxygen Saturation (Exercise) 90 %    Oxygen Saturation (Exit) 95 %    Rating of Perceived Exertion (Exercise) 12    Perceived Dyspnea (Exercise) 11    Duration Continue with 30 min of aerobic exercise without signs/symptoms of physical distress.    Intensity THRR unchanged      Progression   Progression Continue to  progress workloads to maintain intensity without signs/symptoms of physical distress.      Resistance Training   Training Prescription Yes    Weight 1 lbs    Reps 10-15      Treadmill   MPH 1.8    Grade 0    Minutes 22    METs 2.38      NuStep   Level 1    SPM 79    Minutes 17    METs 2             Nutrition:  Target Goals: Understanding of nutrition guidelines, daily intake of sodium 1500mg , cholesterol 200mg , calories 30% from fat and 7% or less from saturated fats, daily to have 5 or more servings of fruits and vegetables.  Biometrics:  Pre Biometrics - 04/22/21 5784  Pre Biometrics   Height 5\' 4"  (1.626 m)    Weight 44 kg    Waist Circumference 29 inches    Hip Circumference 33 inches    Waist to Hip Ratio 0.88 %    BMI (Calculated) 16.64    Triceps Skinfold 5 mm    % Body Fat 22.9 %    Grip Strength 23.4 kg    Flexibility 0 in    Single Leg Stand 12.88 seconds              Nutrition Therapy Plan and Nutrition Goals:   Nutrition Assessments:  MEDIFICTS Score Key: ?70 Need to make dietary changes  40-70 Heart Healthy Diet ? 40 Therapeutic Level Cholesterol Diet   Picture Your Plate Scores: <05 Unhealthy dietary pattern with much room for improvement. 41-50 Dietary pattern unlikely to meet recommendations for good health and room for improvement. 51-60 More healthful dietary pattern, with some room for improvement.  >60 Healthy dietary pattern, although there may be some specific behaviors that could be improved.    Nutrition Goals Re-Evaluation:   Nutrition Goals Discharge (Final Nutrition Goals Re-Evaluation):   Psychosocial: Target Goals: Acknowledge presence or absence of significant depression and/or stress, maximize coping skills, provide positive support system. Participant is able to verbalize types and ability to use techniques and skills needed for reducing stress and depression.  Initial Review & Psychosocial  Screening:  Initial Psych Review & Screening - 04/22/21 0823       Initial Review   Current issues with Current Depression;Current Anxiety/Panic      Family Dynamics   Good Support System? Yes    Comments Her brother and his family support her. She has a boyfriend who has helped her out through depression in the past. She has several friends who she hangs out with a lot.      Barriers   Psychosocial barriers to participate in program There are no identifiable barriers or psychosocial needs.      Screening Interventions   Interventions Encouraged to exercise    Expected Outcomes Short Term goal: Identification and review with participant of any Quality of Life or Depression concerns found by scoring the questionnaire.             Quality of Life Scores:  Scores of 19 and below usually indicate a poorer quality of life in these areas.  A difference of  2-3 points is a clinically meaningful difference.  A difference of 2-3 points in the total score of the Quality of Life Index has been associated with significant improvement in overall quality of life, self-image, physical symptoms, and general health in studies assessing change in quality of life.   PHQ-9: Recent Review Flowsheet Data     Depression screen [facility] 2/9 04/22/2021   Decreased Interest 0   Down, Depressed, Hopeless 0   PHQ - 2 Score 0   Altered sleeping 0   Tired, decreased energy 0   Change in appetite 0   Feeling bad or failure about yourself  0   Trouble concentrating 0   Moving slowly or fidgety/restless 0   Suicidal thoughts 0   PHQ-9 Score 0   Difficult doing work/chores Not difficult at all      Interpretation of Total Score  Total Score Depression Severity:  1-4 = Minimal depression, 5-9 = Mild depression, 10-14 = Moderate depression, 15-19 = Moderately severe depression, 20-27 = Severe depression   Psychosocial Evaluation and Intervention:  Psychosocial Evaluation - 04/22/21  0946        Psychosocial Evaluation & Interventions   Interventions Encouraged to exercise with the program and follow exercise prescription    Comments Pt has no identifiable barriers to completing pulmonary rehab. She has been diagnosed with depression and anxiety. She takes xanax and cymbalta for this, and reports that she feels like this helps. She reports that a few months ago she was feeling very down and depressed following the death of her sister. She went to live with her boyfriend for 4 months, and came home a few weeks ago. She reports that she is in good spirits now and has coped with all of her sadness. She scored a 0 on her PHQ-9. She states that she has a good support system with her brother and his family. She has a boyfriend of 3 years who supports her as well. She has a large circle of friends to support her. Her goals while in the program are to decrease her SOB with ADL's. She has a positive outlook and is excited to start the program.    Expected Outcomes The patient will continue to not have any psychosocial issues. She will not have anymore depressive episodes.    Continue Psychosocial Services  No Follow up required             Psychosocial Re-Evaluation:  Psychosocial Re-Evaluation     [place] Name 04/28/21 [phone]             Psychosocial Re-Evaluation   Current issues with Current Depression;Current Anxiety/Panic       Comments Patient is new to the program and plans to start 6/21. We will continue to montior her progress.       Expected Outcomes Patient will continue to have no psychosocial barriers identified.       Interventions Stress management education;Encouraged to attend Pulmonary Rehabilitation for the exercise;Relaxation education       Continue Psychosocial Services  No Follow up required                Psychosocial Discharge (Final Psychosocial Re-Evaluation):  Psychosocial Re-Evaluation - 04/28/21 0958       Psychosocial Re-Evaluation   Current issues with  Current Depression;Current Anxiety/Panic    Comments Patient is new to the program and plans to start 6/21. We will continue to montior her progress.    Expected Outcomes Patient will continue to have no psychosocial barriers identified.    Interventions Stress management education;Encouraged to attend Pulmonary Rehabilitation for the exercise;Relaxation education    Continue Psychosocial Services  No Follow up required              Education: Education Goals: Education classes will be provided on a weekly basis, covering required topics. Participant will state understanding/return demonstration of topics presented.  Learning Barriers/Preferences:  Learning Barriers/Preferences - 04/22/21 0827       Learning Barriers/Preferences   Learning Barriers None    Learning Preferences Skilled Demonstration             Education Topics: How Lungs Work and Diseases: - Discuss the anatomy of the lungs and diseases that can affect the lungs, such as COPD.   Exercise: -Discuss the importance of exercise, FITT principles of exercise, normal and abnormal responses to exercise, and how to exercise safely.   Environmental Irritants: -Discuss types of environmental irritants and how to limit exposure to environmental irritants.   Meds/Inhalers and oxygen: - Discuss respiratory medications, definition of an inhaler and oxygen, and  the proper way to use an inhaler and oxygen.   Energy Saving Techniques: - Discuss methods to conserve energy and decrease shortness of breath when performing activities of daily living.    Bronchial Hygiene / Breathing Techniques: - Discuss breathing mechanics, pursed-lip breathing technique,  proper posture, effective ways to clear airways, and other functional breathing techniques   Cleaning Equipment: - Provides group verbal and written instruction about the health risks of elevated stress, cause of high stress, and healthy ways to reduce  stress.   Nutrition I: Fats: - Discuss the types of cholesterol, what cholesterol does to the body, and how cholesterol levels can be controlled.   Nutrition II: Labels: -Discuss the different components of food labels and how to read food labels.   Respiratory Infections: - Discuss the signs and symptoms of respiratory infections, ways to prevent respiratory infections, and the importance of seeking medical treatment when having a respiratory infection.   Stress I: Signs and Symptoms: - Discuss the causes of stress, how stress may lead to anxiety and depression, and ways to limit stress.   Stress II: Relaxation: -Discuss relaxation techniques to limit stress.   Oxygen for Home/Travel: - Discuss how to prepare for travel when on oxygen and proper ways to transport and store oxygen to ensure safety.   Knowledge Questionnaire Score:  Knowledge Questionnaire Score - 04/22/21 0828       Knowledge Questionnaire Score   Pre Score 14/18             Core Components/Risk Factors/Patient Goals at Admission:  Personal Goals and Risk Factors at Admission - 04/22/21 0831       Core Components/Risk Factors/Patient Goals on Admission   Improve shortness of breath with ADL's Yes    Intervention Provide education, individualized exercise plan and daily activity instruction to help decrease symptoms of SOB with activities of daily living.    Expected Outcomes Short Term: Improve cardiorespiratory fitness to achieve a reduction of symptoms when performing ADLs;Long Term: Be able to perform more ADLs without symptoms or delay the onset of symptoms             Core Components/Risk Factors/Patient Goals Review:   Goals and Risk Factor Review     Row Name 04/28/21 0959             Core Components/Risk Factors/Patient Goals Review   Personal Goals Review Improve shortness of breath with ADL's       Review Patient was referred to PR by Dr. Elsworth Soho with [facility].  She plans to start the program 05/04/21. Her personal goals for the program are to decrease her SOB with ADL's. We will continue to monitor her progress as she works towards meeting these goals.       Expected Outcomes Patient will complete the program meeting both personal and program goals.                Core Components/Risk Factors/Patient Goals at Discharge (Final Review):   Goals and Risk Factor Review - 04/28/21 0959       Core Components/Risk Factors/Patient Goals Review   Personal Goals Review Improve shortness of breath with ADL's    Review Patient was referred to PR by Dr. Elsworth Soho with [facility]. She plans to start the program 05/04/21. Her personal goals for the program are to decrease her SOB with ADL's. We will continue to monitor her progress as she works towards meeting these goals.    Expected Outcomes Patient will  complete the program meeting both personal and program goals.             ITP Comments:   Comments: ITP REVIEW Pt is making expected progress toward pulmonary rehab goals after completing 2 sessions. Recommend continued exercise, life style modification, education, and utilization of breathing techniques to increase stamina and strength and decrease shortness of breath with exertion.

## 2021-05-06 ENCOUNTER — Encounter (HOSPITAL_COMMUNITY): Payer: Medicare HMO

## 2021-05-11 ENCOUNTER — Encounter (HOSPITAL_COMMUNITY)
Admission: RE | Admit: 2021-05-11 | Discharge: 2021-05-11 | Disposition: A | Discharge status: Home / Self Care | Payer: Medicare HMO | Source: Ambulatory Visit | Attending: Pulmonary Disease | Admitting: Pulmonary Disease

## 2021-05-11 ENCOUNTER — Other Ambulatory Visit: Payer: Self-pay

## 2021-05-11 DIAGNOSIS — J432 Centrilobular emphysema: Secondary | ICD-10-CM

## 2021-05-11 NOTE — Progress Notes (Signed)
Daily Session Note  Patient Details  Name: Brittany Miranda MRN: [id number] Date of Birth: 1950/12/15 Referring Provider:   [place] from 04/22/2021 in [place]  Referring Provider Dr. Elsworth Soho       Encounter Date: 05/11/2021  Check In:  Session Check In - 05/11/21 1330       Check-In   Supervising physician immediately available to respond to emergencies [facility] MD immediately available    Physician(s) Dr. Harl Bowie    Location [facility]    Staff Present Geanie Cooley, RN;Dalton Kris Mouton, MS, ACSM-CEP, Exercise Physiologist    Virtual Visit No    Medication changes reported     No    Fall or balance concerns reported    No    Tobacco Cessation No Change    Warm-up and Cool-down Performed as group-led instruction    Resistance Training Performed Yes    VAD Patient? No    PAD/SET Patient? No      Pain Assessment   Currently in Pain? No/denies    Multiple Pain Sites No             Capillary Blood Glucose: No results found for this or any previous visit (from the past 24 hour(s)).    Social History   Tobacco Use  Smoking Status Former   Packs/day: 1.00   Years: 30.00   Pack years: 30.00   Types: Cigarettes   Quit date: 04/04/1998   Years since quitting: 23.1  Smokeless Tobacco Never    Goals Met:  Proper associated with RPD/PD & O2 Sat Independence with exercise equipment Using PLB without cueing & demonstrates good technique Exercise tolerated well No report of cardiac concerns or symptoms Strength training completed today  Goals Unmet:  Not Applicable  Comments: check out @ 3:30   Dr. Kathie Dike is Medical Director for [facility].

## 2021-05-12 ENCOUNTER — Encounter (HOSPITAL_COMMUNITY): Payer: Medicare HMO

## 2021-05-13 ENCOUNTER — Encounter (HOSPITAL_COMMUNITY)
Admission: RE | Admit: 2021-05-13 | Discharge: 2021-05-13 | Disposition: A | Discharge status: Home / Self Care | Payer: Medicare HMO | Source: Ambulatory Visit | Attending: Pulmonary Disease | Admitting: Pulmonary Disease

## 2021-05-13 ENCOUNTER — Other Ambulatory Visit: Payer: Self-pay

## 2021-05-13 DIAGNOSIS — J432 Centrilobular emphysema: Secondary | ICD-10-CM | POA: Diagnosis not present

## 2021-05-13 NOTE — Progress Notes (Signed)
Daily Session Note  Patient Details  Name: CHAUNICE OBIE MRN: [id number] Date of Birth: May 30, 1951 Referring Provider:   [place] from 04/22/2021 in [place]  Referring Provider Dr. Elsworth Soho       Encounter Date: 05/13/2021  Check In:  Session Check In - 05/13/21 1330       Check-In   Supervising physician immediately available to respond to emergencies [facility] immediately available    Physician(s) Dr. Domenic Polite    Location [facility]    Staff Present Geanie Cooley, RN;Dalton Fletcher, MS, ACSM-CEP, Exercise Physiologist    Virtual Visit No    Medication changes reported     No    Fall or balance concerns reported    No    Tobacco Cessation No Change    Warm-up and Cool-down Performed as group-led instruction    Resistance Training Performed Yes    VAD Patient? No    PAD/SET Patient? No      Pain Assessment   Currently in Pain? No/denies    Multiple Pain Sites No             Capillary Blood Glucose: No results found for this or any previous visit (from the past 24 hour(s)).    Social History   Tobacco Use  Smoking Status Former   Packs/day: 1.00   Years: 30.00   Pack years: 30.00   Types: Cigarettes   Quit date: 04/04/1998   Years since quitting: 23.1  Smokeless Tobacco Never    Goals Met:  Proper associated with RPD/PD & O2 Sat Independence with exercise equipment Using PLB without cueing & demonstrates good technique Exercise tolerated well No report of cardiac concerns or symptoms Strength training completed today  Goals Unmet:  Not Applicable  Comments: Check out @ 2:30pm   Dr. Kathie Dike is Medical Director for [facility].

## 2021-05-18 ENCOUNTER — Encounter: Payer: Self-pay | Admitting: Pulmonary Disease

## 2021-05-18 ENCOUNTER — Encounter (HOSPITAL_COMMUNITY): Payer: Medicare HMO

## 2021-05-18 ENCOUNTER — Other Ambulatory Visit: Payer: Self-pay

## 2021-05-18 ENCOUNTER — Ambulatory Visit: Payer: Medicare HMO | Admitting: Pulmonary Disease

## 2021-05-18 DIAGNOSIS — J449 Chronic obstructive pulmonary disease, unspecified: Secondary | ICD-10-CM

## 2021-05-18 DIAGNOSIS — J9611 Chronic respiratory failure with hypoxia: Secondary | ICD-10-CM | POA: Diagnosis not present

## 2021-05-18 NOTE — Progress Notes (Signed)
   Subjective:    Patient ID: Brittany Miranda, female    DOB: 02-Dec-1950, 70 y.o.   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 Initial office visit 01/2019 , started on Trelegy. She was provided nocturnal oxygen after hospital visit for an exacerbation which she returned.  Chief Complaint  Patient presents with   Follow-up    Patient feels that she is doing much better, no concerns at this time.    She has completed 4 sessions of pulmonary rehab.  Denies cough congestion or wheezing.  Compliant with Trelegy.  Stays active in the yard. We reviewed PFTs  Significant tests/ events reviewed  03/2021 >On ambulation heart rate increased from 80-1 03 and oxygen saturation dropped from 96 to 91% on 2 laps  PFTs 03/2021 ratio 44, FEV1 49%, DLCO 30%, severe airway obstruction  Review of Systems Patient denies significant dyspnea,cough, hemoptysis,  chest pain, palpitations, pedal edema, orthopnea, paroxysmal nocturnal dyspnea, lightheadedness, nausea, vomiting, abdominal or  leg pains      Objective:   Physical Exam  Gen. Pleasant, short,thin, in no distress ENT - no thrush, no pallor/icterus,no post nasal drip Neck: No JVD, no thyromegaly, no carotid bruits Lungs: no use of accessory muscles, no dullness to percussion, clear without rales or rhonchi  Cardiovascular: Rhythm regular, heart sounds  normal, no murmurs or gallops, no peripheral edema Musculoskeletal: No deformities, no cyanosis or clubbing        Assessment & Plan:

## 2021-05-18 NOTE — Patient Instructions (Signed)
?  Continue on trelegy ? ? ?

## 2021-05-18 NOTE — Assessment & Plan Note (Signed)
Continue Trelegy. Use rescue inhaler only on an as-needed basis. I encouraged her to complete the course of pulmonary rehab and explained the cycle of deconditioning with advanced COPD We once again discussed plan for exacerbation and she will call early

## 2021-05-18 NOTE — Assessment & Plan Note (Signed)
Off oxygen now and saturation is good, asked her to monitor

## 2021-05-20 ENCOUNTER — Encounter (HOSPITAL_COMMUNITY)
Admission: RE | Admit: 2021-05-20 | Discharge: 2021-05-20 | Disposition: A | Discharge status: Home / Self Care | Payer: Medicare HMO | Source: Ambulatory Visit | Attending: Pulmonary Disease | Admitting: Pulmonary Disease

## 2021-05-20 ENCOUNTER — Other Ambulatory Visit: Payer: Self-pay

## 2021-05-20 DIAGNOSIS — J432 Centrilobular emphysema: Secondary | ICD-10-CM

## 2021-05-20 NOTE — Progress Notes (Signed)
Daily Session Note  Patient Details  Name: Brittany Miranda MRN: [id number] Date of Birth: 06-21-1951 Referring Provider:   Flowsheet Row PULMONARY REHAB OTHER RESP ORIENTATION from 04/22/2021 in [place]  Referring Provider Dr. Elsworth Soho       Encounter Date: 05/20/2021  Check In:  Session Check In - 05/20/21 1330       Check-In   Supervising physician immediately available to respond to emergencies [facility] immediately available    Physician(s) Dr. Harrington Challenger    Location [facility]    Staff Present Geanie Cooley, RN;Dalton Kris Mouton, MS, ACSM-CEP, Exercise Physiologist;Debra Wynetta Emery, RN, BSN    Virtual Visit No    Medication changes reported     No    Fall or balance concerns reported    No    Tobacco Cessation No Change    Warm-up and Cool-down Performed as group-led instruction    Resistance Training Performed Yes    VAD Patient? No    PAD/SET Patient? No      Pain Assessment   Currently in Pain? No/denies    Multiple Pain Sites No             Capillary Blood Glucose: No results found for this or any previous visit (from the past 24 hour(s)).    Social History   Tobacco Use  Smoking Status Former   Packs/day: 1.00   Years: 30.00   Pack years: 30.00   Types: Cigarettes   Quit date: 04/04/1998   Years since quitting: 23.1  Smokeless Tobacco Never    Goals Met:  Proper associated with RPD/PD & O2 Sat Independence with exercise equipment Using PLB without cueing & demonstrates good technique Exercise tolerated well No report of cardiac concerns or symptoms Strength training completed today  Goals Unmet:  Not Applicable  Comments: check out @ 2:30pm   Dr. Kathie Dike is Medical Director for [facility].

## 2021-05-25 ENCOUNTER — Other Ambulatory Visit: Payer: Self-pay

## 2021-05-25 ENCOUNTER — Encounter (HOSPITAL_COMMUNITY)
Admission: RE | Admit: 2021-05-25 | Discharge: 2021-05-25 | Disposition: A | Discharge status: Home / Self Care | Payer: Medicare HMO | Source: Ambulatory Visit | Attending: Pulmonary Disease | Admitting: Pulmonary Disease

## 2021-05-25 DIAGNOSIS — J432 Centrilobular emphysema: Secondary | ICD-10-CM

## 2021-05-25 NOTE — Progress Notes (Signed)
Daily Session Note  Patient Details  Name: Brittany Miranda MRN: [id number] Date of Birth: 1951-07-04 Referring Provider:   [place] from 04/22/2021 in [place]  Referring Provider Dr. Elsworth Soho       Encounter Date: 05/25/2021  Check In:  Session Check In - 05/25/21 1345       Check-In   Supervising physician immediately available to respond to emergencies [facility] immediately available    Physician(s) Dr. Domenic Polite    Location [facility]    Staff Present Geanie Cooley, RN;Dalton Fletcher, MS, ACSM-CEP, Exercise Physiologist    Virtual Visit No    Medication changes reported     No    Fall or balance concerns reported    No    Tobacco Cessation No Change    Warm-up and Cool-down Performed as group-led instruction    Resistance Training Performed Yes    VAD Patient? No    PAD/SET Patient? No      Pain Assessment   Currently in Pain? No/denies    Multiple Pain Sites No             Capillary Blood Glucose: No results found for this or any previous visit (from the past 24 hour(s)).    Social History   Tobacco Use  Smoking Status Former   Packs/day: 1.00   Years: 30.00   Pack years: 30.00   Types: Cigarettes   Quit date: 04/04/1998   Years since quitting: 23.1  Smokeless Tobacco Never    Goals Met:  Proper associated with RPD/PD & O2 Sat Independence with exercise equipment Using PLB without cueing & demonstrates good technique Exercise tolerated well No report of cardiac concerns or symptoms Strength training completed today  Goals Unmet:  Not Applicable  Comments: check out @ 2:45pm   Dr. Kathie Dike is Medical Director for [facility].

## 2021-05-27 ENCOUNTER — Other Ambulatory Visit: Payer: Self-pay

## 2021-05-27 ENCOUNTER — Encounter (HOSPITAL_COMMUNITY)
Admission: RE | Admit: 2021-05-27 | Discharge: 2021-05-27 | Disposition: A | Discharge status: Home / Self Care | Payer: Medicare HMO | Source: Ambulatory Visit | Attending: Pulmonary Disease | Admitting: Pulmonary Disease

## 2021-05-27 DIAGNOSIS — J432 Centrilobular emphysema: Secondary | ICD-10-CM

## 2021-05-27 NOTE — Progress Notes (Signed)
Daily Session Note  Patient Details  Name: Brittany Miranda MRN: [id number] Date of Birth: 1951/03/02 Referring Provider:   Flowsheet Row PULMONARY REHAB OTHER RESP ORIENTATION from 04/22/2021 in [place]  Referring Provider Dr. Elsworth Soho       Encounter Date: 05/27/2021  Check In:  Session Check In - 05/27/21 1300       Check-In   Supervising physician immediately available to respond to emergencies [facility] MD immediately available    Physician(s) Dr. Harl Bowie    Location [facility]    Staff Present Geanie Cooley, RN;Irania Durell Kris Mouton, MS, ACSM-CEP, Exercise Physiologist;Debra Wynetta Emery, RN, BSN    Virtual Visit No    Medication changes reported     No    Fall or balance concerns reported    No    Tobacco Cessation No Change    Warm-up and Cool-down Performed as group-led instruction    Resistance Training Performed Yes    VAD Patient? No    PAD/SET Patient? No      Pain Assessment   Currently in Pain? No/denies    Multiple Pain Sites No             Capillary Blood Glucose: No results found for this or any previous visit (from the past 24 hour(s)).    Social History   Tobacco Use  Smoking Status Former   Packs/day: 1.00   Years: 30.00   Pack years: 30.00   Types: Cigarettes   Quit date: 04/04/1998   Years since quitting: 23.1  Smokeless Tobacco Never    Goals Met:  Independence with exercise equipment Exercise tolerated well No report of cardiac concerns or symptoms Strength training completed today  Goals Unmet:  Not Applicable  Comments: checkout time is 1430   Dr. Kathie Dike is Medical Director for [facility].

## 2021-06-01 ENCOUNTER — Encounter (HOSPITAL_COMMUNITY): Payer: Medicare HMO

## 2021-06-02 NOTE — Progress Notes (Signed)
Pulmonary Individual Treatment Plan  Patient Details  Name: Brittany Miranda MRN: [id number] Date of Birth: [birth year] Referring Provider:   Flowsheet Row PULMONARY REHAB OTHER RESP ORIENTATION from 04/22/2021 in [place]  Referring Provider Dr. Elsworth Soho       Initial Encounter Date:  Flowsheet Row PULMONARY REHAB OTHER RESP ORIENTATION from 04/22/2021 in [place]  Date 04/22/21       Visit Diagnosis: Centrilobular emphysema ([place])  Patient's Home Medications on Admission:   Current Outpatient Medications:    albuterol (PROVENTIL HFA;VENTOLIN HFA) 108 (90 Base) MCG/ACT inhaler, Inhale 1 puff into the lungs every 6 (six) hours as needed., Disp: , Rfl:    ALPRAZolam (XANAX) 0.25 MG tablet, Take 0.5 mg by mouth 3 (three) times daily as needed for anxiety., Disp: , Rfl:    Calcium Carbonate-Vitamin D (CALTRATE 600+D PO), Take 600 mg by mouth daily., Disp: , Rfl:    DULoxetine (CYMBALTA) 30 MG capsule, Take 30 mg by mouth daily., Disp: , Rfl:    ipratropium (ATROVENT) 0.02 % nebulizer solution, Take 0.5 mg by nebulization 4 (four) times daily., Disp: , Rfl:    Omega-3 Fatty Acids (FISH OIL) 1000 MG CAPS, Take 1,000 mg by mouth daily., Disp: , Rfl:    polycarbophil (FIBERCON) 625 MG tablet, Take 1,250 mg by mouth daily., Disp: , Rfl:   Past Medical History: Past Medical History:  Diagnosis Date   Anxiety    Colon polyps    adenomas in remote past    COPD (chronic obstructive pulmonary disease) ([place])    Depression    Osteopenia     Tobacco Use: Social History   Tobacco Use  Smoking Status Former   Packs/day: 1.00   Years: 30.00   Pack years: 30.00   Types: Cigarettes   Quit date: 04/04/1998   Years since quitting: 23.1  Smokeless Tobacco Never    Labs: Recent Review Flowsheet Data   There is no flowsheet data to display.     Capillary Blood Glucose: No results found for: GLUCAP   Pulmonary Assessment Scores:  Pulmonary  Assessment Scores     Row Name 04/22/21 0821         ADL UCSD   SOB Score total 19           CAT Score     CAT Score 3           mMRC Score     mMRC Score 1            UCSD: Self-administered rating of dyspnea associated with activities of daily living (ADLs) 6-point scale (0 = "not at all" to 5 = "maximal or unable to do because of breathlessness")  Scoring Scores range from 0 to 120.  Minimally important difference is 5 units  CAT: CAT can identify the health impairment of COPD patients and is better correlated with disease progression.  CAT has a scoring range of zero to 40. The CAT score is classified into four groups of low (less than 10), medium (10 - 20), high (21-30) and very high (31-40) based on the impact level of disease on health status. A CAT score over 10 suggests significant symptoms.  A worsening CAT score could be explained by an exacerbation, poor medication adherence, poor inhaler technique, or progression of COPD or comorbid conditions.  CAT MCID is 2 points  mMRC: mMRC (Modified Medical Research Council) Dyspnea Scale is used to assess the degree of baseline functional disability  in patients of respiratory disease due to dyspnea. No minimal important difference is established. A decrease in score of 1 point or greater is considered a positive change.   Pulmonary Function Assessment:   Exercise Target Goals: Exercise Program Goal: Individual exercise prescription set using results from initial 6 min walk test and THRR while considering  patient's activity barriers and safety.   Exercise Prescription Goal: Initial exercise prescription builds to 30-45 minutes a day of aerobic activity, 2-3 days per week.  Home exercise guidelines will be given to patient during program as part of exercise prescription that the participant will acknowledge.  Activity Barriers & Risk Stratification:  Activity Barriers & Cardiac Risk Stratification - 04/22/21 0822        Activity Barriers & Cardiac Risk Stratification   Activity Barriers Back Problems;Shortness of Breath;History of Falls    Cardiac Risk Stratification Low             6 Minute Walk:  6 Minute Walk     Row Name 04/22/21 0937         6 Minute Walk   Phase Initial     Distance 1000 feet     Walk Time 6 minutes     # of Rest Breaks 0     MPH 1.89     METS 2.78     RPE 9     Perceived Dyspnea  9     VO2 Peak 9.71     Symptoms No     Resting HR 80 bpm     Resting BP 122/84     Resting Oxygen Saturation  95 %     Exercise Oxygen Saturation  during 6 min walk 88 %     Max Ex. HR 97 bpm     Max Ex. BP 126/86     2 Minute Post BP 118/86           Interval HR     1 Minute HR 87     2 Minute HR 92     3 Minute HR 89     4 Minute HR 95     5 Minute HR 92     6 Minute HR 97     2 Minute Post HR 82     Interval Heart Rate? Yes           Interval Oxygen     Interval Oxygen? Yes     Baseline Oxygen Saturation % 95 %     1 Minute Oxygen Saturation % 90 %     1 Minute Liters of Oxygen 0 L     2 Minute Oxygen Saturation % 91 %     2 Minute Liters of Oxygen 0 L     3 Minute Oxygen Saturation % 90 %     3 Minute Liters of Oxygen 0 L     4 Minute Oxygen Saturation % 91 %     4 Minute Liters of Oxygen 0 L     5 Minute Oxygen Saturation % 89 %     5 Minute Liters of Oxygen 0 L     6 Minute Oxygen Saturation % 88 %     6 Minute Liters of Oxygen 0 L     2 Minute Post Oxygen Saturation % 91 %     2 Minute Post Liters of Oxygen 0 L             Oxygen Initial Assessment:  Oxygen  Initial Assessment - 04/22/21 0937       Initial 6 min Walk   Oxygen Used None      Program Oxygen Prescription   Program Oxygen Prescription None      Intervention   Short Term Goals To learn and exhibit compliance with exercise, home and travel O2 prescription;To learn and understand importance of monitoring SPO2 with pulse oximeter and demonstrate accurate use of the pulse oximeter.;To  learn and understand importance of maintaining oxygen saturations>88%;To learn and demonstrate proper pursed lip breathing techniques or other breathing techniques.     Long  Term Goals Exhibits compliance with exercise, home  and travel O2 prescription;Verbalizes importance of monitoring SPO2 with pulse oximeter and return demonstration;Maintenance of O2 saturations>88%;Exhibits proper breathing techniques, such as pursed lip breathing or other method taught during program session;Compliance with respiratory medication             Oxygen Re-Evaluation:  Oxygen Re-Evaluation     Row Name 05/04/21 1530 06/01/21 1016           Program Oxygen Prescription   Program Oxygen Prescription None None             Home Oxygen      Home Oxygen Device None None      Sleep Oxygen Prescription None None      Home Exercise Oxygen Prescription None None      Home Resting Oxygen Prescription None None      Compliance with Home Oxygen Use Yes Yes             Goals/Expected Outcomes      Short Term Goals To learn and exhibit compliance with exercise, home and travel O2 prescription;To learn and understand importance of monitoring SPO2 with pulse oximeter and demonstrate accurate use of the pulse oximeter.;To learn and understand importance of maintaining oxygen saturations>88%;To learn and demonstrate proper pursed lip breathing techniques or other breathing techniques.  To learn and exhibit compliance with exercise, home and travel O2 prescription;To learn and understand importance of monitoring SPO2 with pulse oximeter and demonstrate accurate use of the pulse oximeter.;To learn and understand importance of maintaining oxygen saturations>88%;To learn and demonstrate proper pursed lip breathing techniques or other breathing techniques.       Long  Term Goals Exhibits compliance with exercise, home  and travel O2 prescription;Verbalizes importance of monitoring SPO2 with pulse oximeter and return  demonstration;Maintenance of O2 saturations>88%;Exhibits proper breathing techniques, such as pursed lip breathing or other method taught during program session;Compliance with respiratory medication Exhibits compliance with exercise, home  and travel O2 prescription;Verbalizes importance of monitoring SPO2 with pulse oximeter and return demonstration;Maintenance of O2 saturations>88%;Exhibits proper breathing techniques, such as pursed lip breathing or other method taught during program session;Compliance with respiratory medication      Goals/Expected Outcomes compliance compliance              Oxygen Discharge (Final Oxygen Re-Evaluation):  Oxygen Re-Evaluation - 06/01/21 1016       Program Oxygen Prescription   Program Oxygen Prescription None      Home Oxygen   Home Oxygen Device None    Sleep Oxygen Prescription None    Home Exercise Oxygen Prescription None    Home Resting Oxygen Prescription None    Compliance with Home Oxygen Use Yes      Goals/Expected Outcomes   Short Term Goals To learn and exhibit compliance with exercise, home and travel O2 prescription;To learn and understand importance of monitoring SPO2  with pulse oximeter and demonstrate accurate use of the pulse oximeter.;To learn and understand importance of maintaining oxygen saturations>88%;To learn and demonstrate proper pursed lip breathing techniques or other breathing techniques.     Long  Term Goals Exhibits compliance with exercise, home  and travel O2 prescription;Verbalizes importance of monitoring SPO2 with pulse oximeter and return demonstration;Maintenance of O2 saturations>88%;Exhibits proper breathing techniques, such as pursed lip breathing or other method taught during program session;Compliance with respiratory medication    Goals/Expected Outcomes compliance             Initial Exercise Prescription:  Initial Exercise Prescription - 04/22/21 0900       Date of Initial Exercise RX and  Referring Provider   Date 04/22/21    Referring Provider Dr. Elsworth Soho    Expected Discharge Date 09/02/21      Treadmill   MPH 1.8    Grade 0    Minutes 17      NuStep   Level 1    SPM 80    Minutes 22      Prescription Details   Frequency (times per week) 2    Duration Progress to 30 minutes of continuous aerobic without signs/symptoms of physical distress      Intensity   THRR 40-80% of Max Heartrate 60-119    Ratings of Perceived Exertion 11-13    Perceived Dyspnea 0-4      Resistance Training   Training Prescription Yes    Weight 2 lbs    Reps 10-15             Perform Capillary Blood Glucose checks as needed.  Exercise Prescription Changes:   Exercise Prescription Changes     Row Name 05/04/21 1500 05/13/21 1315 05/27/21 1016         Response to Exercise   Blood Pressure (Admit) 142/78 122/72 120/64     Blood Pressure (Exercise) 138/80 120/72 140/68     Blood Pressure (Exit) 112/80 120/78 128/80     Heart Rate (Admit) 98 bpm 97 bpm 97 bpm     Heart Rate (Exercise) 105 bpm 98 bpm 101 bpm     Heart Rate (Exit) 92 bpm 88 bpm 87 bpm     Oxygen Saturation (Admit) 93 % 96 % 91 %     Oxygen Saturation (Exercise) 90 % 92 % 92 %     Oxygen Saturation (Exit) 95 % 96 % 95 %     Rating of Perceived Exertion (Exercise) 12 11 11      Perceived Dyspnea (Exercise) 11 11 11      Duration Continue with 30 min of aerobic exercise without signs/symptoms of physical distress. Continue with 30 min of aerobic exercise without signs/symptoms of physical distress. Continue with 30 min of aerobic exercise without signs/symptoms of physical distress.     Intensity THRR unchanged THRR unchanged THRR unchanged           Progression       Progression Continue to progress workloads to maintain intensity without signs/symptoms of physical distress. Continue to progress workloads to maintain intensity without signs/symptoms of physical distress. Continue to progress workloads to maintain  intensity without signs/symptoms of physical distress.           Resistance Training       Training Prescription Yes Yes Yes     Weight 1 lbs 3 lbs 3 lbs     Reps 10-15 10-15 10-15     Time -- -- 10 Minutes  Treadmill       MPH 1.8 2.2 2.3     Grade 0 0 0     Minutes 22 22 22      METs 2.38 2.53 2.76           NuStep       Level 1 1 1      SPM 79 82 85     Minutes 17 17 17      METs 2 1.8 1.9             Exercise Comments:   Exercise Goals and Review:   Exercise Goals     Row Name 04/22/21 0941 05/04/21 1533 06/01/21 1018         Exercise Goals   Increase Physical Activity Yes Yes Yes     Intervention Provide advice, education, support and counseling about physical activity/exercise needs.;Develop an individualized exercise prescription for aerobic and resistive training based on initial evaluation findings, risk stratification, comorbidities and participant's personal goals. Provide advice, education, support and counseling about physical activity/exercise needs.;Develop an individualized exercise prescription for aerobic and resistive training based on initial evaluation findings, risk stratification, comorbidities and participant's personal goals. Provide advice, education, support and counseling about physical activity/exercise needs.;Develop an individualized exercise prescription for aerobic and resistive training based on initial evaluation findings, risk stratification, comorbidities and participant's personal goals.     Expected Outcomes Short Term: Attend rehab on a regular basis to increase amount of physical activity.;Long Term: Add in home exercise to make exercise part of routine and to increase amount of physical activity.;Long Term: Exercising regularly at least 3-5 days a week. Short Term: Attend rehab on a regular basis to increase amount of physical activity.;Long Term: Add in home exercise to make exercise part of routine and to increase amount of  physical activity.;Long Term: Exercising regularly at least 3-5 days a week. Short Term: Attend rehab on a regular basis to increase amount of physical activity.;Long Term: Add in home exercise to make exercise part of routine and to increase amount of physical activity.;Long Term: Exercising regularly at least 3-5 days a week.     Increase Strength and Stamina Yes Yes Yes     Intervention Provide advice, education, support and counseling about physical activity/exercise needs.;Develop an individualized exercise prescription for aerobic and resistive training based on initial evaluation findings, risk stratification, comorbidities and participant's personal goals. Provide advice, education, support and counseling about physical activity/exercise needs.;Develop an individualized exercise prescription for aerobic and resistive training based on initial evaluation findings, risk stratification, comorbidities and participant's personal goals. Provide advice, education, support and counseling about physical activity/exercise needs.;Develop an individualized exercise prescription for aerobic and resistive training based on initial evaluation findings, risk stratification, comorbidities and participant's personal goals.     Expected Outcomes Short Term: Increase workloads from initial exercise prescription for resistance, speed, and METs.;Short Term: Perform resistance training exercises routinely during rehab and add in resistance training at home;Long Term: Improve cardiorespiratory fitness, muscular endurance and strength as measured by increased METs and functional capacity (6MWT) Short Term: Increase workloads from initial exercise prescription for resistance, speed, and METs.;Short Term: Perform resistance training exercises routinely during rehab and add in resistance training at home;Long Term: Improve cardiorespiratory fitness, muscular endurance and strength as measured by increased METs and functional  capacity (6MWT) Short Term: Increase workloads from initial exercise prescription for resistance, speed, and METs.;Short Term: Perform resistance training exercises routinely during rehab and add in resistance training at home;Long Term: Improve cardiorespiratory fitness,  muscular endurance and strength as measured by increased METs and functional capacity (6MWT)     Able to understand and use rate of perceived exertion (RPE) scale Yes Yes Yes     Intervention Provide education and explanation on how to use RPE scale Provide education and explanation on how to use RPE scale Provide education and explanation on how to use RPE scale     Expected Outcomes Short Term: Able to use RPE daily in rehab to express subjective intensity level;Long Term:  Able to use RPE to guide intensity level when exercising independently Short Term: Able to use RPE daily in rehab to express subjective intensity level;Long Term:  Able to use RPE to guide intensity level when exercising independently Short Term: Able to use RPE daily in rehab to express subjective intensity level;Long Term:  Able to use RPE to guide intensity level when exercising independently     Able to understand and use Dyspnea scale Yes Yes Yes     Intervention Provide education and explanation on how to use Dyspnea scale Provide education and explanation on how to use Dyspnea scale Provide education and explanation on how to use Dyspnea scale     Expected Outcomes Short Term: Able to use Dyspnea scale daily in rehab to express subjective sense of shortness of breath during exertion;Long Term: Able to use Dyspnea scale to guide intensity level when exercising independently Short Term: Able to use Dyspnea scale daily in rehab to express subjective sense of shortness of breath during exertion;Long Term: Able to use Dyspnea scale to guide intensity level when exercising independently Short Term: Able to use Dyspnea scale daily in rehab to express subjective sense of  shortness of breath during exertion;Long Term: Able to use Dyspnea scale to guide intensity level when exercising independently     Knowledge and understanding of Target Heart Rate Range (THRR) Yes Yes Yes     Intervention Provide education and explanation of THRR including how the numbers were predicted and where they are located for reference Provide education and explanation of THRR including how the numbers were predicted and where they are located for reference Provide education and explanation of THRR including how the numbers were predicted and where they are located for reference     Expected Outcomes Short Term: Able to state/look up THRR;Long Term: Able to use THRR to govern intensity when exercising independently;Short Term: Able to use daily as guideline for intensity in rehab Short Term: Able to state/look up THRR;Long Term: Able to use THRR to govern intensity when exercising independently;Short Term: Able to use daily as guideline for intensity in rehab Short Term: Able to state/look up THRR;Long Term: Able to use THRR to govern intensity when exercising independently;Short Term: Able to use daily as guideline for intensity in rehab     Understanding of Exercise Prescription Yes Yes Yes     Intervention Provide education, explanation, and written materials on patient's individual exercise prescription Provide education, explanation, and written materials on patient's individual exercise prescription Provide education, explanation, and written materials on patient's individual exercise prescription     Expected Outcomes Short Term: Able to explain program exercise prescription;Long Term: Able to explain home exercise prescription to exercise independently Short Term: Able to explain program exercise prescription;Long Term: Able to explain home exercise prescription to exercise independently Short Term: Able to explain program exercise prescription;Long Term: Able to explain home exercise  prescription to exercise independently  Exercise Goals Re-Evaluation :  Exercise Goals Re-Evaluation     Row Name 05/04/21 1533 06/01/21 1018           Exercise Goal Re-Evaluation   Exercise Goals Review Increase Physical Activity;Increase Strength and Stamina;Able to understand and use rate of perceived exertion (RPE) scale;Understanding of Exercise Prescription;Able to check pulse independently;Knowledge and understanding of Target Heart Rate Range (THRR) Increase Physical Activity;Increase Strength and Stamina;Able to understand and use rate of perceived exertion (RPE) scale;Understanding of Exercise Prescription;Able to check pulse independently;Knowledge and understanding of Target Heart Rate Range (THRR)      Comments Pt has completed 1 exercise session. She is eager to participate in the program. She currently exercises at 2.0 METs on the stepper. Will continue to monitor and progress as able. Pt has completed 6 exercise sessions. She has been inconsistent with her attendance, but has still been able to increase her intensity on the treadmill. Her oxygen sats drop sometimes on the treadmill but recover when she does pursed lip breathing. She is currently exercising at 1.9 METs on the stepper. Will continue to monitor and progress as able.      Expected Outcomes Through exercise at home and at rehab the patient will meet their goals. Through exercise at home and at rehab the patient will meet their goals.               Discharge Exercise Prescription (Final Exercise Prescription Changes):  Exercise Prescription Changes - 05/27/21 1016       Response to Exercise   Blood Pressure (Admit) 120/64    Blood Pressure (Exercise) 140/68    Blood Pressure (Exit) 128/80    Heart Rate (Admit) 97 bpm    Heart Rate (Exercise) 101 bpm    Heart Rate (Exit) 87 bpm    Oxygen Saturation (Admit) 91 %    Oxygen Saturation (Exercise) 92 %    Oxygen Saturation (Exit) 95 %     Rating of Perceived Exertion (Exercise) 11    Perceived Dyspnea (Exercise) 11    Duration Continue with 30 min of aerobic exercise without signs/symptoms of physical distress.    Intensity THRR unchanged      Progression   Progression Continue to progress workloads to maintain intensity without signs/symptoms of physical distress.      Resistance Training   Training Prescription Yes    Weight 3 lbs    Reps 10-15    Time 10 Minutes      Treadmill   MPH 2.3    Grade 0    Minutes 22    METs 2.76      NuStep   Level 1    SPM 85    Minutes 17    METs 1.9             Nutrition:  Target Goals: Understanding of nutrition guidelines, daily intake of sodium 1500mg , cholesterol 200mg , calories 30% from fat and 7% or less from saturated fats, daily to have 5 or more servings of fruits and vegetables.  Biometrics:  Pre Biometrics - 04/22/21 0942       Pre Biometrics   Height 5\' 4"  (1.626 m)    Weight 44 kg    Waist Circumference 29 inches    Hip Circumference 33 inches    Waist to Hip Ratio 0.88 %    BMI (Calculated) 16.64    Triceps Skinfold 5 mm    % Body Fat 22.9 %    Grip Strength 23.4 kg  Flexibility 0 in    Single Leg Stand 12.88 seconds              Nutrition Therapy Plan and Nutrition Goals:  Nutrition Therapy & Goals - 05/25/21 1020       Personal Nutrition Goals   Comments Patient scored 18 on the diet assestement.  Educational materials  provided and discussed regarding healthier choices.  We provide 2 education sessions on heart healthy nurtition with handouts and discussion.      Intervention Plan   Intervention Nutrition handout(s) given to patient.             Nutrition Assessments:  Nutrition Assessments - 05/25/21 1008       MEDFICTS Scores   Pre Score 18            MEDIFICTS Score Key: ?70 Need to make dietary changes  40-70 Heart Healthy Diet ? 40 Therapeutic Level Cholesterol Diet   Picture Your Plate Scores: <18  Unhealthy dietary pattern with much room for improvement. 41-50 Dietary pattern unlikely to meet recommendations for good health and room for improvement. 51-60 More healthful dietary pattern, with some room for improvement.  >60 Healthy dietary pattern, although there may be some specific behaviors that could be improved.    Nutrition Goals Re-Evaluation:   Nutrition Goals Discharge (Final Nutrition Goals Re-Evaluation):   Psychosocial: Target Goals: Acknowledge presence or absence of significant depression and/or stress, maximize coping skills, provide positive support system. Participant is able to verbalize types and ability to use techniques and skills needed for reducing stress and depression.  Initial Review & Psychosocial Screening:  Initial Psych Review & Screening - 04/22/21 0823       Initial Review   Current issues with Current Depression;Current Anxiety/Panic      Family Dynamics   Good Support System? Yes    Comments Her brother and his family support her. She has a boyfriend who has helped her out through depression in the past. She has several friends who she hangs out with a lot.      Barriers   Psychosocial barriers to participate in program There are no identifiable barriers or psychosocial needs.      Screening Interventions   Interventions Encouraged to exercise    Expected Outcomes Short Term goal: Identification and review with participant of any Quality of Life or Depression concerns found by scoring the questionnaire.             Quality of Life Scores:  Scores of 19 and below usually indicate a poorer quality of life in these areas.  A difference of  2-3 points is a clinically meaningful difference.  A difference of 2-3 points in the total score of the Quality of Life Index has been associated with significant improvement in overall quality of life, self-image, physical symptoms, and general health in studies assessing change in quality of  life.   PHQ-9: Recent Review Flowsheet Data     Depression screen [facility] 2/9 04/22/2021   Decreased Interest 0   Down, Depressed, Hopeless 0   PHQ - 2 Score 0   Altered sleeping 0   Tired, decreased energy 0   Change in appetite 0   Feeling bad or failure about yourself  0   Trouble concentrating 0   Moving slowly or fidgety/restless 0   Suicidal thoughts 0   PHQ-9 Score 0   Difficult doing work/chores Not difficult at all      Interpretation of Total Score  Total Score Depression Severity:  1-4 = Minimal depression, 5-9 = Mild depression, 10-14 = Moderate depression, 15-19 = Moderately severe depression, 20-27 = Severe depression   Psychosocial Evaluation and Intervention:  Psychosocial Evaluation - 04/22/21 0946       Psychosocial Evaluation & Interventions   Interventions Encouraged to exercise with the program and follow exercise prescription    Comments Pt has no identifiable barriers to completing pulmonary rehab. She has been diagnosed with depression and anxiety. She takes xanax and cymbalta for this, and reports that she feels like this helps. She reports that a few months ago she was feeling very down and depressed following the death of her sister. She went to live with her boyfriend for 4 months, and came home a few weeks ago. She reports that she is in good spirits now and has coped with all of her sadness. She scored a 0 on her PHQ-9. She states that she has a good support system with her brother and his family. She has a boyfriend of 3 years who supports her as well. She has a large circle of friends to support her. Her goals while in the program are to decrease her SOB with ADL's. She has a positive outlook and is excited to start the program.    Expected Outcomes The patient will continue to not have any psychosocial issues. She will not have anymore depressive episodes.    Continue Psychosocial Services  No Follow up required             Psychosocial  Re-Evaluation:  Psychosocial Re-Evaluation     [place] Name 04/28/21 0958 05/25/21 0953           Psychosocial Re-Evaluation   Current issues with Current Depression;Current Anxiety/Panic Current Depression;Current Anxiety/Panic      Comments Patient is new to the program and plans to start 6/21. We will continue to montior her progress. Patient is new in the program completing 4 sessions. Her initial PHQ-9 score was 0.  She was referred to [facility] rehabilitation by Dr. Elsworth Soho with COPD with chronic bronchitis and Centrilobular Emphysema .  Her personal goals for the program are to breath better and decrease SOB with house work and ADLs.  We will continue to montior  her progress as she works toward meeting these goals.      Expected Outcomes Patient will continue to have no psychosocial barriers identified. Patient will continue to have no psychosocial barriers identified.      Interventions Stress management education;Encouraged to attend Pulmonary Rehabilitation for the exercise;Relaxation education Stress management education;Encouraged to attend Pulmonary Rehabilitation for the exercise;Relaxation education      Continue Psychosocial Services  No Follow up required No Follow up required               Psychosocial Discharge (Final Psychosocial Re-Evaluation):  Psychosocial Re-Evaluation - 05/25/21 0953       Psychosocial Re-Evaluation   Current issues with Current Depression;Current Anxiety/Panic    Comments Patient is new in the program completing 4 sessions. Her initial PHQ-9 score was 0.  She was referred to [facility] rehabilitation by Dr. Elsworth Soho with COPD with chronic bronchitis and Centrilobular Emphysema .  Her personal goals for the program are to breath better and decrease SOB with house work and ADLs.  We will continue to montior  her progress as she works toward meeting these goals.    Expected Outcomes Patient will continue to have no psychosocial barriers identified.     Interventions  Stress management education;Encouraged to attend Pulmonary Rehabilitation for the exercise;Relaxation education    Continue Psychosocial Services  No Follow up required              Education: Education Goals: Education classes will be provided on a weekly basis, covering required topics. Participant will state understanding/return demonstration of topics presented.  Learning Barriers/Preferences:  Learning Barriers/Preferences - 04/22/21 0827       Learning Barriers/Preferences   Learning Barriers None    Learning Preferences Skilled Demonstration             Education Topics: How Lungs Work and Diseases: - Discuss the anatomy of the lungs and diseases that can affect the lungs, such as COPD. Flowsheet Row PULMONARY REHAB OTHER RESPIRATORY from 05/27/2021 in [place]  Date 05/20/21  Educator pb  Instruction Review Code 1- Verbalizes Understanding       Exercise: -Discuss the importance of exercise, FITT principles of exercise, normal and abnormal responses to exercise, and how to exercise safely.   Environmental Irritants: -Discuss types of environmental irritants and how to limit exposure to environmental irritants. Flowsheet Row PULMONARY REHAB OTHER RESPIRATORY from 05/27/2021 in [place]  Date 05/27/21  Educator DF  Instruction Review Code 2- Demonstrated Understanding       Meds/Inhalers and oxygen: - Discuss respiratory medications, definition of an inhaler and oxygen, and the proper way to use an inhaler and oxygen.   Energy Saving Techniques: - Discuss methods to conserve energy and decrease shortness of breath when performing activities of daily living.    Bronchial Hygiene / Breathing Techniques: - Discuss breathing mechanics, pursed-lip breathing technique,  proper posture, effective ways to clear airways, and other functional breathing techniques   Cleaning Equipment: - Provides  group verbal and written instruction about the health risks of elevated stress, cause of high stress, and healthy ways to reduce stress.   Nutrition I: Fats: - Discuss the types of cholesterol, what cholesterol does to the body, and how cholesterol levels can be controlled.   Nutrition II: Labels: -Discuss the different components of food labels and how to read food labels.   Respiratory Infections: - Discuss the signs and symptoms of respiratory infections, ways to prevent respiratory infections, and the importance of seeking medical treatment when having a respiratory infection.   Stress I: Signs and Symptoms: - Discuss the causes of stress, how stress may lead to anxiety and depression, and ways to limit stress.   Stress II: Relaxation: -Discuss relaxation techniques to limit stress.   Oxygen for Home/Travel: - Discuss how to prepare for travel when on oxygen and proper ways to transport and store oxygen to ensure safety.   Knowledge Questionnaire Score:  Knowledge Questionnaire Score - 04/22/21 0828       Knowledge Questionnaire Score   Pre Score 14/18             Core Components/Risk Factors/Patient Goals at Admission:  Personal Goals and Risk Factors at Admission - 04/22/21 0831       Core Components/Risk Factors/Patient Goals on Admission   Improve shortness of breath with ADL's Yes    Intervention Provide education, individualized exercise plan and daily activity instruction to help decrease symptoms of SOB with activities of daily living.    Expected Outcomes Short Term: Improve cardiorespiratory fitness to achieve a reduction of symptoms when performing ADLs;Long Term: Be able to perform more ADLs without symptoms or delay the onset of symptoms  Core Components/Risk Factors/Patient Goals Review:   Goals and Risk Factor Review     Row Name 04/28/21 0959 05/25/21 1145           Core Components/Risk Factors/Patient Goals Review    Personal Goals Review Improve shortness of breath with ADL's Improve shortness of breath with ADL's      Review Patient was referred to [facility] by Dr. Elsworth Soho with Centrilobular Emphysema. She plans to start the program 05/04/21. Her personal goals for the program are to decrease her SOB with ADL's. We will continue to monitor her progress as she works towards meeting these goals. Patient is new to program attending 4 sessions. She will complete the prpogram meeting both program and personal goals.She is doing well in the program with progression and consistence attendance.  O2 sats averaging 89%-93% while exercising and no acute respiratory noted.  Encourage breathing techniques such as purse lipped breathing. Her personal goals are to decrease SOB  with ADL's.   We will continue to monitor her progress as she works towards meeting these goals and getting stronger as she progresses with the PR program.      Expected Outcomes Patient will complete the program meeting both personal and program goals. Patient will complete the program meeting both personal and program goals.               Core Components/Risk Factors/Patient Goals at Discharge (Final Review):   Goals and Risk Factor Review - 05/25/21 1145       Core Components/Risk Factors/Patient Goals Review   Personal Goals Review Improve shortness of breath with ADL's    Review Patient is new to program attending 4 sessions. She will complete the prpogram meeting both program and personal goals.She is doing well in the program with progression and consistence attendance.  O2 sats averaging 89%-93% while exercising and no acute respiratory noted.  Encourage breathing techniques such as purse lipped breathing. Her personal goals are to decrease SOB  with ADL's.   We will continue to monitor her progress as she works towards meeting these goals and getting stronger as she progresses with the PR program.    Expected Outcomes Patient will complete the program  meeting both personal and program goals.             ITP Comments:   Comments: ITP REVIEW Pt is making expected progress toward pulmonary rehab goals after completing 7 sessions. Recommend continued exercise, life style modification, education, and utilization of breathing techniques to increase stamina and strength and decrease shortness of breath with exertion.

## 2021-06-03 ENCOUNTER — Encounter (HOSPITAL_COMMUNITY)
Admission: RE | Admit: 2021-06-03 | Discharge: 2021-06-03 | Disposition: A | Discharge status: Home / Self Care | Payer: Medicare HMO | Source: Ambulatory Visit | Attending: Pulmonary Disease | Admitting: Pulmonary Disease

## 2021-06-03 ENCOUNTER — Other Ambulatory Visit: Payer: Self-pay

## 2021-06-03 DIAGNOSIS — J432 Centrilobular emphysema: Secondary | ICD-10-CM | POA: Diagnosis not present

## 2021-06-03 NOTE — Progress Notes (Signed)
Daily Session Note  Patient Details  Name: Brittany Miranda MRN: [id number] Date of Birth: Apr 07, 1951 Referring Provider:   [place] from 04/22/2021 in [place]  Referring Provider Dr. Elsworth Soho       Encounter Date: 06/03/2021  Check In:  Session Check In - 06/03/21 1330       Check-In   Supervising physician immediately available to respond to emergencies [facility] MD immediately available    Physician(s) Dr. Johnsie Cancel    Location [facility]    Staff Present Aundra Dubin, RN, Bjorn Loser, MS, ACSM-CEP, Exercise Physiologist;Other    Virtual Visit No    Medication changes reported     No    Fall or balance concerns reported    No    Tobacco Cessation No Change    Warm-up and Cool-down Performed as group-led instruction    Resistance Training Performed Yes    PAD/SET Patient? No      Pain Assessment   Currently in Pain? No/denies    Multiple Pain Sites No             Capillary Blood Glucose: No results found for this or any previous visit (from the past 24 hour(s)).    Social History   Tobacco Use  Smoking Status Former   Packs/day: 1.00   Years: 30.00   Pack years: 30.00   Types: Cigarettes   Quit date: 04/04/1998   Years since quitting: 23.1  Smokeless Tobacco Never    Goals Met:  Proper associated with RPD/PD & O2 Sat Independence with exercise equipment Using PLB without cueing & demonstrates good technique Exercise tolerated well No report of cardiac concerns or symptoms Strength training completed today  Goals Unmet:  Not Applicable  Comments: Check out 1430.   Dr. Kathie Dike is Medical Director for [facility] Pulmonary Rehab.

## 2021-06-07 ENCOUNTER — Encounter: Payer: Self-pay | Admitting: *Deleted

## 2021-06-08 ENCOUNTER — Other Ambulatory Visit: Payer: Self-pay

## 2021-06-08 ENCOUNTER — Encounter (HOSPITAL_COMMUNITY)
Admission: RE | Admit: 2021-06-08 | Discharge: 2021-06-08 | Disposition: A | Discharge status: Home / Self Care | Payer: Medicare HMO | Source: Ambulatory Visit | Attending: Pulmonary Disease | Admitting: Pulmonary Disease

## 2021-06-08 DIAGNOSIS — J432 Centrilobular emphysema: Secondary | ICD-10-CM | POA: Diagnosis not present

## 2021-06-08 NOTE — Progress Notes (Signed)
Daily Session Note  Patient Details  Name: Brittany Miranda MRN: [id number] Date of Birth: 15-Aug-1951 Referring Provider:   [place] from 04/22/2021 in [place]  Referring Provider Dr. Elsworth Soho       Encounter Date: 06/08/2021  Check In:  Session Check In - 06/08/21 1330       Check-In   Supervising physician immediately available to respond to emergencies [facility] MD immediately available    Physician(s) Dr. Harl Bowie    Location [facility]    Staff Present Hoy Register, MS, ACSM-CEP, Exercise Physiologist;Phyllis Billingsley, RN    Virtual Visit No    Medication changes reported     No    Fall or balance concerns reported    No    Tobacco Cessation No Change    Warm-up and Cool-down Performed as group-led instruction    Resistance Training Performed Yes    VAD Patient? No    PAD/SET Patient? No      Pain Assessment   Currently in Pain? No/denies    Multiple Pain Sites No             Capillary Blood Glucose: No results found for this or any previous visit (from the past 24 hour(s)).    Social History   Tobacco Use  Smoking Status Former   Packs/day: 1.00   Years: 30.00   Pack years: 30.00   Types: Cigarettes   Quit date: 04/04/1998   Years since quitting: 23.1  Smokeless Tobacco Never    Goals Met:  Independence with exercise equipment Exercise tolerated well No report of cardiac concerns or symptoms Strength training completed today  Goals Unmet:  Not Applicable  Comments: checkout time is 1430   Dr. Kathie Dike is Medical Director for [facility] Pulmonary Rehab.

## 2021-06-10 ENCOUNTER — Encounter (HOSPITAL_COMMUNITY)
Admission: RE | Admit: 2021-06-10 | Discharge: 2021-06-10 | Disposition: A | Discharge status: Home / Self Care | Payer: Medicare HMO | Source: Ambulatory Visit | Attending: Pulmonary Disease | Admitting: Pulmonary Disease

## 2021-06-10 ENCOUNTER — Other Ambulatory Visit: Payer: Self-pay

## 2021-06-10 DIAGNOSIS — J432 Centrilobular emphysema: Secondary | ICD-10-CM | POA: Diagnosis not present

## 2021-06-10 NOTE — Progress Notes (Signed)
d 

## 2021-06-10 NOTE — Progress Notes (Signed)
Daily Session Note  Patient Details  Name: Brittany Miranda MRN: [id number] Date of Birth: 1951-04-21 Referring Provider:   Flowsheet Row PULMONARY REHAB OTHER RESP ORIENTATION from 04/22/2021 in [place]  Referring Provider Dr. Elsworth Soho       Encounter Date: 06/10/2021  Check In:  Session Check In - 06/10/21 1330       Check-In   Supervising physician immediately available to respond to emergencies [facility] MD immediately available    Physician(s) Dr. Harl Bowie    Staff Present Hoy Register, MS, ACSM-CEP, Exercise Physiologist;Vernon Ariel, RN    Virtual Visit No    Medication changes reported     No    Fall or balance concerns reported    No    Tobacco Cessation No Change    Warm-up and Cool-down Performed as group-led instruction    Resistance Training Performed Yes    VAD Patient? No    PAD/SET Patient? No      Pain Assessment   Currently in Pain? No/denies    Multiple Pain Sites No             Capillary Blood Glucose: No results found for this or any previous visit (from the past 24 hour(s)).    Social History   Tobacco Use  Smoking Status Former   Packs/day: 1.00   Years: 30.00   Pack years: 30.00   Types: Cigarettes   Quit date: 04/04/1998   Years since quitting: 23.2  Smokeless Tobacco Never    Goals Met:  Independence with exercise equipment Using PLB without cueing & demonstrates good technique Exercise tolerated well No report of cardiac concerns or symptoms Strength training completed today  Goals Unmet:  Not Applicable  Comments: check out @ 2:30pm   Dr. Kathie Dike is Medical Director for [facility] Pulmonary Rehab.

## 2021-06-15 ENCOUNTER — Encounter (HOSPITAL_COMMUNITY)
Admission: RE | Admit: 2021-06-15 | Discharge: 2021-06-15 | Disposition: A | Discharge status: Home / Self Care | Payer: Medicare HMO | Source: Ambulatory Visit | Attending: Pulmonary Disease | Admitting: Pulmonary Disease

## 2021-06-15 ENCOUNTER — Other Ambulatory Visit: Payer: Self-pay

## 2021-06-15 VITALS — Wt 95.7 lb

## 2021-06-15 DIAGNOSIS — J432 Centrilobular emphysema: Secondary | ICD-10-CM | POA: Insufficient documentation

## 2021-06-15 NOTE — Progress Notes (Signed)
Daily Session Note  Patient Details  Name: Brittany Miranda MRN: [id number] Date of Birth: 02/10/1951 Referring Provider:   Flowsheet Row PULMONARY REHAB OTHER RESP ORIENTATION from 04/22/2021 in [place]  Referring Provider Dr. Elsworth Soho       Encounter Date: 06/15/2021  Check In:  Session Check In - 06/15/21 1330       Check-In   Supervising physician immediately available to respond to emergencies [facility] immediately available    Physician(s) Dr. Johnsie Cancel    Location [facility]    Staff Present Hoy Register, MS, ACSM-CEP, Exercise Physiologist;Owynn Mosqueda, RN    Virtual Visit No    Medication changes reported     No    Fall or balance concerns reported    No    Tobacco Cessation No Change    Warm-up and Cool-down Performed as group-led instruction    Resistance Training Performed Yes    VAD Patient? No    PAD/SET Patient? No      Pain Assessment   Currently in Pain? No/denies    Multiple Pain Sites No             Capillary Blood Glucose: No results found for this or any previous visit (from the past 24 hour(s)).    Social History   Tobacco Use  Smoking Status Former   Packs/day: 1.00   Years: 30.00   Pack years: 30.00   Types: Cigarettes   Quit date: 04/04/1998   Years since quitting: 23.2  Smokeless Tobacco Never    Goals Met:  Independence with exercise equipment Using PLB without cueing & demonstrates good technique Exercise tolerated well No report of cardiac concerns or symptoms Strength training completed today  Goals Unmet:  Not Applicable  Comments: checkout @ 2:30pm   Dr. Kathie Dike is Medical Director for [facility] Pulmonary Rehab.

## 2021-06-17 ENCOUNTER — Encounter (HOSPITAL_COMMUNITY)
Admission: RE | Admit: 2021-06-17 | Discharge: 2021-06-17 | Disposition: A | Discharge status: Home / Self Care | Payer: Medicare HMO | Source: Ambulatory Visit | Attending: Pulmonary Disease | Admitting: Pulmonary Disease

## 2021-06-17 ENCOUNTER — Other Ambulatory Visit: Payer: Self-pay

## 2021-06-17 DIAGNOSIS — J432 Centrilobular emphysema: Secondary | ICD-10-CM

## 2021-06-17 NOTE — Progress Notes (Signed)
Daily Session Note  Patient Details  Name: Brittany Miranda MRN: [id number] Date of Birth: 05-08-1951 Referring Provider:   Flowsheet Row PULMONARY REHAB OTHER RESP ORIENTATION from 04/22/2021 in [place]  Referring Provider Dr. Elsworth Soho       Encounter Date: 06/17/2021  Check In:  Session Check In - 06/17/21 1330       Check-In   Supervising physician immediately available to respond to emergencies [facility] MD immediately available    Physician(s) Dr. Domenic Polite    Location [facility]    Staff Present Aundra Dubin, RN, BSN    Virtual Visit No    Medication changes reported     No    Fall or balance concerns reported    No    Tobacco Cessation No Change    Warm-up and Cool-down Performed as group-led instruction    Resistance Training Performed Yes    VAD Patient? No    PAD/SET Patient? No      Pain Assessment   Currently in Pain? No/denies    Multiple Pain Sites No             Capillary Blood Glucose: No results found for this or any previous visit (from the past 24 hour(s)).    Social History   Tobacco Use  Smoking Status Former   Packs/day: 1.00   Years: 30.00   Pack years: 30.00   Types: Cigarettes   Quit date: 04/04/1998   Years since quitting: 23.2  Smokeless Tobacco Never    Goals Met:  Proper associated with RPD/PD & O2 Sat Independence with exercise equipment Using PLB without cueing & demonstrates good technique Exercise tolerated well No report of cardiac concerns or symptoms Strength training completed today  Goals Unmet:  Not Applicable  Comments: Check out 1430.   Dr. Kathie Dike is Medical Director for [facility] Pulmonary Rehab.

## 2021-06-22 ENCOUNTER — Encounter (HOSPITAL_COMMUNITY)
Admission: RE | Admit: 2021-06-22 | Discharge: 2021-06-22 | Disposition: A | Discharge status: Home / Self Care | Payer: Medicare HMO | Source: Ambulatory Visit | Attending: Pulmonary Disease | Admitting: Pulmonary Disease

## 2021-06-22 ENCOUNTER — Other Ambulatory Visit: Payer: Self-pay

## 2021-06-22 DIAGNOSIS — J432 Centrilobular emphysema: Secondary | ICD-10-CM | POA: Diagnosis not present

## 2021-06-22 NOTE — Progress Notes (Signed)
Daily Session Note  Patient Details  Name: Brittany Miranda MRN: [id number] Date of Birth: 05-Oct-1951 Referring Provider:   [place] from 04/22/2021 in [place]  Referring Provider Dr. Elsworth Soho       Encounter Date: 06/22/2021  Check In:  Session Check In - 06/22/21 1330       Check-In   Supervising physician immediately available to respond to emergencies [facility] MD immediately available    Physician(s) Dr. Johnsie Cancel    Location [facility]    Staff Present Geanie Cooley, RN;Dalton Kris Mouton, MS, ACSM-CEP, Exercise Physiologist    Virtual Visit No    Medication changes reported     No    Fall or balance concerns reported    No    Tobacco Cessation No Change    Warm-up and Cool-down Performed as group-led instruction    Resistance Training Performed Yes    VAD Patient? No    PAD/SET Patient? No      Pain Assessment   Currently in Pain? No/denies    Multiple Pain Sites No             Capillary Blood Glucose: No results found for this or any previous visit (from the past 24 hour(s)).    Social History   Tobacco Use  Smoking Status Former   Packs/day: 1.00   Years: 30.00   Pack years: 30.00   Types: Cigarettes   Quit date: 04/04/1998   Years since quitting: 23.2  Smokeless Tobacco Never    Goals Met:  Proper associated with RPD/PD & O2 Sat Independence with exercise equipment Exercise tolerated well No report of cardiac concerns or symptoms Strength training completed today  Goals Unmet:  Not Applicable  Comments: check out @ 2:30pm   Dr. Kathie Dike is Medical Director for [facility] Pulmonary Rehab.

## 2021-06-24 ENCOUNTER — Encounter: Payer: Self-pay | Admitting: Internal Medicine

## 2021-06-24 ENCOUNTER — Encounter (HOSPITAL_COMMUNITY)
Admission: RE | Admit: 2021-06-24 | Discharge: 2021-06-24 | Disposition: A | Discharge status: Home / Self Care | Payer: Medicare HMO | Source: Ambulatory Visit | Attending: Pulmonary Disease | Admitting: Pulmonary Disease

## 2021-06-24 ENCOUNTER — Other Ambulatory Visit: Payer: Self-pay

## 2021-06-24 DIAGNOSIS — J432 Centrilobular emphysema: Secondary | ICD-10-CM | POA: Diagnosis not present

## 2021-06-24 NOTE — Progress Notes (Signed)
Daily Session Note  Patient Details  Name: Brittany Miranda MRN: [id number] Date of Birth: October 25, 1951 Referring Provider:   Flowsheet Row PULMONARY REHAB OTHER RESP ORIENTATION from 04/22/2021 in [place]  Referring Provider Dr. Elsworth Soho       Encounter Date: 06/24/2021  Check In:  Session Check In - 06/24/21 1330       Check-In   Supervising physician immediately available to respond to emergencies [facility] MD immediately available    Physician(s) Dr. Harrington Challenger    Location [facility]    Staff Present Hoy Register, MS, ACSM-CEP, Exercise Physiologist    Virtual Visit No    Medication changes reported     No    Fall or balance concerns reported    No    Tobacco Cessation No Change    Warm-up and Cool-down Performed as group-led instruction    Resistance Training Performed Yes    VAD Patient? No    PAD/SET Patient? No      Pain Assessment   Currently in Pain? No/denies    Multiple Pain Sites No             Capillary Blood Glucose: No results found for this or any previous visit (from the past 24 hour(s)).    Social History   Tobacco Use  Smoking Status Former   Packs/day: 1.00   Years: 30.00   Pack years: 30.00   Types: Cigarettes   Quit date: 04/04/1998   Years since quitting: 23.2  Smokeless Tobacco Never    Goals Met:  Independence with exercise equipment Exercise tolerated well No report of cardiac concerns or symptoms Strength training completed today  Goals Unmet:  Not Applicable  Comments: checkout time is 1430   Dr. Kathie Dike is Medical Director for [facility] Pulmonary Rehab.

## 2021-06-29 ENCOUNTER — Encounter (HOSPITAL_COMMUNITY)
Admission: RE | Admit: 2021-06-29 | Discharge: 2021-06-29 | Disposition: A | Discharge status: Home / Self Care | Payer: Medicare HMO | Source: Ambulatory Visit | Attending: Pulmonary Disease | Admitting: Pulmonary Disease

## 2021-06-29 ENCOUNTER — Other Ambulatory Visit: Payer: Self-pay

## 2021-06-29 VITALS — Wt 92.4 lb

## 2021-06-29 DIAGNOSIS — J432 Centrilobular emphysema: Secondary | ICD-10-CM | POA: Diagnosis not present

## 2021-06-29 NOTE — Progress Notes (Signed)
Daily Session Note  Patient Details  Name: Brittany Miranda MRN: [id number] Date of Birth: 03-26-1951 Referring Provider:   [place] from 04/22/2021 in [place]  Referring Provider Dr. Elsworth Soho       Encounter Date: 06/29/2021  Check In:  Session Check In - 06/29/21 1330       Check-In   Supervising physician immediately available to respond to emergencies [facility] MD immediately available    Physician(s) Dr. Domenic Polite    Location [facility]    Staff Present Geanie Cooley, RN;Dalton Fletcher, MS, ACSM-CEP, Exercise Physiologist    Virtual Visit No    Medication changes reported     No    Fall or balance concerns reported    No    Tobacco Cessation No Change    Warm-up and Cool-down Performed as group-led instruction    Resistance Training Performed Yes    VAD Patient? No    PAD/SET Patient? No      Pain Assessment   Currently in Pain? No/denies    Multiple Pain Sites No             Capillary Blood Glucose: No results found for this or any previous visit (from the past 24 hour(s)).    Social History   Tobacco Use  Smoking Status Former   Packs/day: 1.00   Years: 30.00   Pack years: 30.00   Types: Cigarettes   Quit date: 04/04/1998   Years since quitting: 23.2  Smokeless Tobacco Never    Goals Met:  Proper associated with RPD/PD & O2 Sat Independence with exercise equipment Exercise tolerated well No report of cardiac concerns or symptoms Strength training completed today  Goals Unmet:  Not Applicable  Comments: check out @ 2:30pm   Dr. Kathie Dike is Medical Director for [facility] Pulmonary Rehab.

## 2021-06-30 NOTE — Progress Notes (Signed)
Pulmonary Individual Treatment Plan  Patient Details  Name: Brittany Miranda MRN: [id number] Date of Birth: July 17, 1951 Referring Provider:   Flowsheet Row PULMONARY REHAB OTHER RESP ORIENTATION from 04/22/2021 in [place]  Referring Provider Dr. Elsworth Soho       Initial Encounter Date:  Flowsheet Row PULMONARY REHAB OTHER RESP ORIENTATION from 04/22/2021 in [place]  Date 04/22/21       Visit Diagnosis: Centrilobular emphysema ([place])  Patient's Home Medications on Admission:   Current Outpatient Medications:    albuterol (PROVENTIL HFA;VENTOLIN HFA) 108 (90 Base) MCG/ACT inhaler, Inhale 1 puff into the lungs every 6 (six) hours as needed., Disp: , Rfl:    ALPRAZolam (XANAX) 0.25 MG tablet, Take 0.5 mg by mouth 3 (three) times daily as needed for anxiety., Disp: , Rfl:    Calcium Carbonate-Vitamin D (CALTRATE 600+D PO), Take 600 mg by mouth daily., Disp: , Rfl:    DULoxetine (CYMBALTA) 30 MG capsule, Take 30 mg by mouth daily., Disp: , Rfl:    ipratropium (ATROVENT) 0.02 % nebulizer solution, Take 0.5 mg by nebulization 4 (four) times daily., Disp: , Rfl:    Omega-3 Fatty Acids (FISH OIL) 1000 MG CAPS, Take 1,000 mg by mouth daily., Disp: , Rfl:    polycarbophil (FIBERCON) 625 MG tablet, Take 1,250 mg by mouth daily., Disp: , Rfl:   Past Medical History: Past Medical History:  Diagnosis Date   Anxiety    Colon polyps    adenomas in remote past    COPD (chronic obstructive pulmonary disease) ([place])    Depression    Osteopenia     Tobacco Use: Social History   Tobacco Use  Smoking Status Former   Packs/day: 1.00   Years: 30.00   Pack years: 30.00   Types: Cigarettes   Quit date: 04/04/1998   Years since quitting: 23.2  Smokeless Tobacco Never    Labs: Recent Review Flowsheet Data   There is no flowsheet data to display.     Capillary Blood Glucose: No results found for: GLUCAP   Pulmonary Assessment Scores:  Pulmonary  Assessment Scores     Row Name 04/22/21 0821         ADL UCSD   SOB Score total 19           CAT Score   CAT Score 3           mMRC Score   mMRC Score 1             UCSD: Self-administered rating of dyspnea associated with activities of daily living (ADLs) 6-point scale (0 = "not at all" to 5 = "maximal or unable to do because of breathlessness")  Scoring Scores range from 0 to 120.  Minimally important difference is 5 units  CAT: CAT can identify the health impairment of COPD patients and is better correlated with disease progression.  CAT has a scoring range of zero to 40. The CAT score is classified into four groups of low (less than 10), medium (10 - 20), high (21-30) and very high (31-40) based on the impact level of disease on health status. A CAT score over 10 suggests significant symptoms.  A worsening CAT score could be explained by an exacerbation, poor medication adherence, poor inhaler technique, or progression of COPD or comorbid conditions.  CAT MCID is 2 points  mMRC: mMRC (Modified Medical Research Council) Dyspnea Scale is used to assess the degree of baseline functional disability in patients of  respiratory disease due to dyspnea. No minimal important difference is established. A decrease in score of 1 point or greater is considered a positive change.   Pulmonary Function Assessment:   Exercise Target Goals: Exercise Program Goal: Individual exercise prescription set using results from initial 6 min walk test and THRR while considering  patient's activity barriers and safety.   Exercise Prescription Goal: Initial exercise prescription builds to 30-45 minutes a day of aerobic activity, 2-3 days per week.  Home exercise guidelines will be given to patient during program as part of exercise prescription that the participant will acknowledge.  Activity Barriers & Risk Stratification:  Activity Barriers & Cardiac Risk Stratification - 04/22/21 0822        Activity Barriers & Cardiac Risk Stratification   Activity Barriers Back Problems;Shortness of Breath;History of Falls    Cardiac Risk Stratification Low             6 Minute Walk:  6 Minute Walk     Row Name 04/22/21 0937         6 Minute Walk   Phase Initial     Distance 1000 feet     Walk Time 6 minutes     # of Rest Breaks 0     MPH 1.89     METS 2.78     RPE 9     Perceived Dyspnea  9     VO2 Peak 9.71     Symptoms No     Resting HR 80 bpm     Resting BP 122/84     Resting Oxygen Saturation  95 %     Exercise Oxygen Saturation  during 6 min walk 88 %     Max Ex. HR 97 bpm     Max Ex. BP 126/86     2 Minute Post BP 118/86           Interval HR   1 Minute HR 87     2 Minute HR 92     3 Minute HR 89     4 Minute HR 95     5 Minute HR 92     6 Minute HR 97     2 Minute Post HR 82     Interval Heart Rate? Yes           Interval Oxygen   Interval Oxygen? Yes     Baseline Oxygen Saturation % 95 %     1 Minute Oxygen Saturation % 90 %     1 Minute Liters of Oxygen 0 L     2 Minute Oxygen Saturation % 91 %     2 Minute Liters of Oxygen 0 L     3 Minute Oxygen Saturation % 90 %     3 Minute Liters of Oxygen 0 L     4 Minute Oxygen Saturation % 91 %     4 Minute Liters of Oxygen 0 L     5 Minute Oxygen Saturation % 89 %     5 Minute Liters of Oxygen 0 L     6 Minute Oxygen Saturation % 88 %     6 Minute Liters of Oxygen 0 L     2 Minute Post Oxygen Saturation % 91 %     2 Minute Post Liters of Oxygen 0 L              Oxygen Initial Assessment:  Oxygen Initial Assessment - 04/22/21 4259  Initial 6 min Walk   Oxygen Used None      Program Oxygen Prescription   Program Oxygen Prescription None      Intervention   Short Term Goals To learn and exhibit compliance with exercise, home and travel O2 prescription;To learn and understand importance of monitoring SPO2 with pulse oximeter and demonstrate accurate use of the pulse oximeter.;To  learn and understand importance of maintaining oxygen saturations>88%;To learn and demonstrate proper pursed lip breathing techniques or other breathing techniques.     Long  Term Goals Exhibits compliance with exercise, home  and travel O2 prescription;Verbalizes importance of monitoring SPO2 with pulse oximeter and return demonstration;Maintenance of O2 saturations>88%;Exhibits proper breathing techniques, such as pursed lip breathing or other method taught during program session;Compliance with respiratory medication             Oxygen Re-Evaluation:  Oxygen Re-Evaluation     Row Name 05/04/21 1530 06/01/21 1016 06/29/21 1443         Program Oxygen Prescription   Program Oxygen Prescription None None None           Home Oxygen   Home Oxygen Device None None None     Sleep Oxygen Prescription None None None     Home Exercise Oxygen Prescription None None None     Home Resting Oxygen Prescription None None None     Compliance with Home Oxygen Use Yes Yes Yes           Goals/Expected Outcomes   Short Term Goals To learn and exhibit compliance with exercise, home and travel O2 prescription;To learn and understand importance of monitoring SPO2 with pulse oximeter and demonstrate accurate use of the pulse oximeter.;To learn and understand importance of maintaining oxygen saturations>88%;To learn and demonstrate proper pursed lip breathing techniques or other breathing techniques.  To learn and exhibit compliance with exercise, home and travel O2 prescription;To learn and understand importance of monitoring SPO2 with pulse oximeter and demonstrate accurate use of the pulse oximeter.;To learn and understand importance of maintaining oxygen saturations>88%;To learn and demonstrate proper pursed lip breathing techniques or other breathing techniques.  To learn and exhibit compliance with exercise, home and travel O2 prescription;To learn and understand importance of monitoring SPO2 with pulse  oximeter and demonstrate accurate use of the pulse oximeter.;To learn and understand importance of maintaining oxygen saturations>88%;To learn and demonstrate proper pursed lip breathing techniques or other breathing techniques.      Long  Term Goals Exhibits compliance with exercise, home  and travel O2 prescription;Verbalizes importance of monitoring SPO2 with pulse oximeter and return demonstration;Maintenance of O2 saturations>88%;Exhibits proper breathing techniques, such as pursed lip breathing or other method taught during program session;Compliance with respiratory medication Exhibits compliance with exercise, home  and travel O2 prescription;Verbalizes importance of monitoring SPO2 with pulse oximeter and return demonstration;Maintenance of O2 saturations>88%;Exhibits proper breathing techniques, such as pursed lip breathing or other method taught during program session;Compliance with respiratory medication Exhibits compliance with exercise, home  and travel O2 prescription;Verbalizes importance of monitoring SPO2 with pulse oximeter and return demonstration;Maintenance of O2 saturations>88%;Exhibits proper breathing techniques, such as pursed lip breathing or other method taught during program session;Compliance with respiratory medication     Goals/Expected Outcomes compliance compliance compliance              Oxygen Discharge (Final Oxygen Re-Evaluation):  Oxygen Re-Evaluation - 06/29/21 1443       Program Oxygen Prescription   Program Oxygen Prescription None  Home Oxygen   Home Oxygen Device None    Sleep Oxygen Prescription None    Home Exercise Oxygen Prescription None    Home Resting Oxygen Prescription None    Compliance with Home Oxygen Use Yes      Goals/Expected Outcomes   Short Term Goals To learn and exhibit compliance with exercise, home and travel O2 prescription;To learn and understand importance of monitoring SPO2 with pulse oximeter and demonstrate accurate  use of the pulse oximeter.;To learn and understand importance of maintaining oxygen saturations>88%;To learn and demonstrate proper pursed lip breathing techniques or other breathing techniques.     Long  Term Goals Exhibits compliance with exercise, home  and travel O2 prescription;Verbalizes importance of monitoring SPO2 with pulse oximeter and return demonstration;Maintenance of O2 saturations>88%;Exhibits proper breathing techniques, such as pursed lip breathing or other method taught during program session;Compliance with respiratory medication    Goals/Expected Outcomes compliance             Initial Exercise Prescription:  Initial Exercise Prescription - 04/22/21 0900       Date of Initial Exercise RX and Referring Provider   Date 04/22/21    Referring Provider Dr. Elsworth Soho    Expected Discharge Date 09/02/21      Treadmill   MPH 1.8    Grade 0    Minutes 17      NuStep   Level 1    SPM 80    Minutes 22      Prescription Details   Frequency (times per week) 2    Duration Progress to 30 minutes of continuous aerobic without signs/symptoms of physical distress      Intensity   THRR 40-80% of Max Heartrate 60-119    Ratings of Perceived Exertion 11-13    Perceived Dyspnea 0-4      Resistance Training   Training Prescription Yes    Weight 2 lbs    Reps 10-15             Perform Capillary Blood Glucose checks as needed.  Exercise Prescription Changes:   Exercise Prescription Changes     Row Name 05/04/21 1500 05/13/21 1315 05/27/21 1016 06/15/21 1400 06/29/21 1400     Response to Exercise   Blood Pressure (Admit) 142/78 122/72 120/64 144/68 168/88   Blood Pressure (Exercise) 138/80 120/72 140/68 140/80 162/84   Blood Pressure (Exit) 112/80 120/78 128/80 140/70 140/80   Heart Rate (Admit) 98 bpm 97 bpm 97 bpm 88 bpm 82 bpm   Heart Rate (Exercise) 105 bpm 98 bpm 101 bpm 103 bpm 102 bpm   Heart Rate (Exit) 92 bpm 88 bpm 87 bpm 91 bpm 90 bpm   Oxygen  Saturation (Admit) 93 % 96 % 91 % 92 % 94 %   Oxygen Saturation (Exercise) 90 % 92 % 92 % 91 % 89 %   Oxygen Saturation (Exit) 95 % 96 % 95 % 97 % 97 %   Rating of Perceived Exertion (Exercise) '12 11 11 12 12   '$ Perceived Dyspnea (Exercise) '11 11 11 11 12   '$ Duration Continue with 30 min of aerobic exercise without signs/symptoms of physical distress. Continue with 30 min of aerobic exercise without signs/symptoms of physical distress. Continue with 30 min of aerobic exercise without signs/symptoms of physical distress. Continue with 30 min of aerobic exercise without signs/symptoms of physical distress. Continue with 30 min of aerobic exercise without signs/symptoms of physical distress.   Intensity THRR unchanged THRR unchanged THRR unchanged THRR  unchanged THRR unchanged     Progression   Progression Continue to progress workloads to maintain intensity without signs/symptoms of physical distress. Continue to progress workloads to maintain intensity without signs/symptoms of physical distress. Continue to progress workloads to maintain intensity without signs/symptoms of physical distress. Continue to progress workloads to maintain intensity without signs/symptoms of physical distress. Continue to progress workloads to maintain intensity without signs/symptoms of physical distress.     Resistance Training   Training Prescription Yes Yes Yes Yes Yes   Weight 1 lbs 3 lbs 3 lbs 3 lbs 3 lbs   Reps 10-15 10-15 10-15 10-15 10-15   Time -- -- 10 Minutes 10 Minutes 10 Minutes     Treadmill   MPH 1.8 2.2 2.3 2.4 2.6   Grade 0 0 0 0 0   Minutes '22 22 22 22 22   '$ METs 2.38 2.53 2.76 2.83 2.99     NuStep   Level '1 1 1 2 2   '$ SPM 79 82 85 94 94   Minutes '17 17 17 17 17   '$ METs 2 1.8 1.9 2.2 2.3            Exercise Comments:   Exercise Goals and Review:   Exercise Goals     Row Name 04/22/21 0941 05/04/21 1533 06/01/21 1018 06/29/21 1444       Exercise Goals   Increase Physical Activity  Yes Yes Yes Yes    Intervention Provide advice, education, support and counseling about physical activity/exercise needs.;Develop an individualized exercise prescription for aerobic and resistive training based on initial evaluation findings, risk stratification, comorbidities and participant's personal goals. Provide advice, education, support and counseling about physical activity/exercise needs.;Develop an individualized exercise prescription for aerobic and resistive training based on initial evaluation findings, risk stratification, comorbidities and participant's personal goals. Provide advice, education, support and counseling about physical activity/exercise needs.;Develop an individualized exercise prescription for aerobic and resistive training based on initial evaluation findings, risk stratification, comorbidities and participant's personal goals. Provide advice, education, support and counseling about physical activity/exercise needs.;Develop an individualized exercise prescription for aerobic and resistive training based on initial evaluation findings, risk stratification, comorbidities and participant's personal goals.    Expected Outcomes Short Term: Attend rehab on a regular basis to increase amount of physical activity.;Long Term: Add in home exercise to make exercise part of routine and to increase amount of physical activity.;Long Term: Exercising regularly at least 3-5 days a week. Short Term: Attend rehab on a regular basis to increase amount of physical activity.;Long Term: Add in home exercise to make exercise part of routine and to increase amount of physical activity.;Long Term: Exercising regularly at least 3-5 days a week. Short Term: Attend rehab on a regular basis to increase amount of physical activity.;Long Term: Add in home exercise to make exercise part of routine and to increase amount of physical activity.;Long Term: Exercising regularly at least 3-5 days a week. Short Term:  Attend rehab on a regular basis to increase amount of physical activity.;Long Term: Add in home exercise to make exercise part of routine and to increase amount of physical activity.;Long Term: Exercising regularly at least 3-5 days a week.    Increase Strength and Stamina Yes Yes Yes Yes    Intervention Provide advice, education, support and counseling about physical activity/exercise needs.;Develop an individualized exercise prescription for aerobic and resistive training based on initial evaluation findings, risk stratification, comorbidities and participant's personal goals. Provide advice, education, support and counseling about physical activity/exercise  needs.;Develop an individualized exercise prescription for aerobic and resistive training based on initial evaluation findings, risk stratification, comorbidities and participant's personal goals. Provide advice, education, support and counseling about physical activity/exercise needs.;Develop an individualized exercise prescription for aerobic and resistive training based on initial evaluation findings, risk stratification, comorbidities and participant's personal goals. Provide advice, education, support and counseling about physical activity/exercise needs.;Develop an individualized exercise prescription for aerobic and resistive training based on initial evaluation findings, risk stratification, comorbidities and participant's personal goals.    Expected Outcomes Short Term: Increase workloads from initial exercise prescription for resistance, speed, and METs.;Short Term: Perform resistance training exercises routinely during rehab and add in resistance training at home;Long Term: Improve cardiorespiratory fitness, muscular endurance and strength as measured by increased METs and functional capacity (6MWT) Short Term: Increase workloads from initial exercise prescription for resistance, speed, and METs.;Short Term: Perform resistance training exercises  routinely during rehab and add in resistance training at home;Long Term: Improve cardiorespiratory fitness, muscular endurance and strength as measured by increased METs and functional capacity (6MWT) Short Term: Increase workloads from initial exercise prescription for resistance, speed, and METs.;Short Term: Perform resistance training exercises routinely during rehab and add in resistance training at home;Long Term: Improve cardiorespiratory fitness, muscular endurance and strength as measured by increased METs and functional capacity (6MWT) Short Term: Increase workloads from initial exercise prescription for resistance, speed, and METs.;Short Term: Perform resistance training exercises routinely during rehab and add in resistance training at home;Long Term: Improve cardiorespiratory fitness, muscular endurance and strength as measured by increased METs and functional capacity (6MWT)    Able to understand and use rate of perceived exertion (RPE) scale Yes Yes Yes Yes    Intervention Provide education and explanation on how to use RPE scale Provide education and explanation on how to use RPE scale Provide education and explanation on how to use RPE scale Provide education and explanation on how to use RPE scale    Expected Outcomes Short Term: Able to use RPE daily in rehab to express subjective intensity level;Long Term:  Able to use RPE to guide intensity level when exercising independently Short Term: Able to use RPE daily in rehab to express subjective intensity level;Long Term:  Able to use RPE to guide intensity level when exercising independently Short Term: Able to use RPE daily in rehab to express subjective intensity level;Long Term:  Able to use RPE to guide intensity level when exercising independently Short Term: Able to use RPE daily in rehab to express subjective intensity level;Long Term:  Able to use RPE to guide intensity level when exercising independently    Able to understand and use  Dyspnea scale Yes Yes Yes Yes    Intervention Provide education and explanation on how to use Dyspnea scale Provide education and explanation on how to use Dyspnea scale Provide education and explanation on how to use Dyspnea scale Provide education and explanation on how to use Dyspnea scale    Expected Outcomes Short Term: Able to use Dyspnea scale daily in rehab to express subjective sense of shortness of breath during exertion;Long Term: Able to use Dyspnea scale to guide intensity level when exercising independently Short Term: Able to use Dyspnea scale daily in rehab to express subjective sense of shortness of breath during exertion;Long Term: Able to use Dyspnea scale to guide intensity level when exercising independently Short Term: Able to use Dyspnea scale daily in rehab to express subjective sense of shortness of breath during exertion;Long Term: Able to use Dyspnea  scale to guide intensity level when exercising independently Short Term: Able to use Dyspnea scale daily in rehab to express subjective sense of shortness of breath during exertion;Long Term: Able to use Dyspnea scale to guide intensity level when exercising independently    Knowledge and understanding of Target Heart Rate Range (THRR) Yes Yes Yes Yes    Intervention Provide education and explanation of THRR including how the numbers were predicted and where they are located for reference Provide education and explanation of THRR including how the numbers were predicted and where they are located for reference Provide education and explanation of THRR including how the numbers were predicted and where they are located for reference Provide education and explanation of THRR including how the numbers were predicted and where they are located for reference    Expected Outcomes Short Term: Able to state/look up THRR;Long Term: Able to use THRR to govern intensity when exercising independently;Short Term: Able to use daily as guideline for  intensity in rehab Short Term: Able to state/look up THRR;Long Term: Able to use THRR to govern intensity when exercising independently;Short Term: Able to use daily as guideline for intensity in rehab Short Term: Able to state/look up THRR;Long Term: Able to use THRR to govern intensity when exercising independently;Short Term: Able to use daily as guideline for intensity in rehab Short Term: Able to state/look up THRR;Long Term: Able to use THRR to govern intensity when exercising independently;Short Term: Able to use daily as guideline for intensity in rehab    Understanding of Exercise Prescription Yes Yes Yes Yes    Intervention Provide education, explanation, and written materials on patient's individual exercise prescription Provide education, explanation, and written materials on patient's individual exercise prescription Provide education, explanation, and written materials on patient's individual exercise prescription Provide education, explanation, and written materials on patient's individual exercise prescription    Expected Outcomes Short Term: Able to explain program exercise prescription;Long Term: Able to explain home exercise prescription to exercise independently Short Term: Able to explain program exercise prescription;Long Term: Able to explain home exercise prescription to exercise independently Short Term: Able to explain program exercise prescription;Long Term: Able to explain home exercise prescription to exercise independently Short Term: Able to explain program exercise prescription;Long Term: Able to explain home exercise prescription to exercise independently             Exercise Goals Re-Evaluation :  Exercise Goals Re-Evaluation     [place] Name 05/04/21 1533 06/01/21 1018 06/29/21 1444         Exercise Goal Re-Evaluation   Exercise Goals Review Increase Physical Activity;Increase Strength and Stamina;Able to understand and use rate of perceived exertion (RPE)  scale;Understanding of Exercise Prescription;Able to check pulse independently;Knowledge and understanding of Target Heart Rate Range (THRR) Increase Physical Activity;Increase Strength and Stamina;Able to understand and use rate of perceived exertion (RPE) scale;Understanding of Exercise Prescription;Able to check pulse independently;Knowledge and understanding of Target Heart Rate Range (THRR) Increase Physical Activity;Increase Strength and Stamina;Able to understand and use rate of perceived exertion (RPE) scale;Understanding of Exercise Prescription;Able to check pulse independently;Knowledge and understanding of Target Heart Rate Range (THRR)     Comments Pt has completed 1 exercise session. She is eager to participate in the program. She currently exercises at 2.0 METs on the stepper. Will continue to monitor and progress as able. Pt has completed 6 exercise sessions. She has been inconsistent with her attendance, but has still been able to increase her intensity on the treadmill. Her oxygen  sats drop sometimes on the treadmill but recover when she does pursed lip breathing. She is currently exercising at 1.9 METs on the stepper. Will continue to monitor and progress as able. Pt has completed 14 exercise sessions. Her attendance has been consistent over the past month. She has been able to increase her workloads and her SpO2 levels have remained adequate. She is currently exercising at 2.3 METs on the stepper. Will continue to monitor and progress as able.     Expected Outcomes Through exercise at home and at rehab the patient will meet their goals. Through exercise at home and at rehab the patient will meet their goals. Through exercise at home and at rehab the patient will meet their goals.              Discharge Exercise Prescription (Final Exercise Prescription Changes):  Exercise Prescription Changes - 06/29/21 1400       Response to Exercise   Blood Pressure (Admit) 168/88    Blood  Pressure (Exercise) 162/84    Blood Pressure (Exit) 140/80    Heart Rate (Admit) 82 bpm    Heart Rate (Exercise) 102 bpm    Heart Rate (Exit) 90 bpm    Oxygen Saturation (Admit) 94 %    Oxygen Saturation (Exercise) 89 %    Oxygen Saturation (Exit) 97 %    Rating of Perceived Exertion (Exercise) 12    Perceived Dyspnea (Exercise) 12    Duration Continue with 30 min of aerobic exercise without signs/symptoms of physical distress.    Intensity THRR unchanged      Progression   Progression Continue to progress workloads to maintain intensity without signs/symptoms of physical distress.      Resistance Training   Training Prescription Yes    Weight 3 lbs    Reps 10-15    Time 10 Minutes      Treadmill   MPH 2.6    Grade 0    Minutes 22    METs 2.99      NuStep   Level 2    SPM 94    Minutes 17    METs 2.3             Nutrition:  Target Goals: Understanding of nutrition guidelines, daily intake of sodium '1500mg'$ , cholesterol '200mg'$ , calories 30% from fat and 7% or less from saturated fats, daily to have 5 or more servings of fruits and vegetables.  Biometrics:  Pre Biometrics - 04/22/21 0942       Pre Biometrics   Height '5\' 4"'$  (1.626 m)    Weight 44 kg    Waist Circumference 29 inches    Hip Circumference 33 inches    Waist to Hip Ratio 0.88 %    BMI (Calculated) 16.64    Triceps Skinfold 5 mm    % Body Fat 22.9 %    Grip Strength 23.4 kg    Flexibility 0 in    Single Leg Stand 12.88 seconds              Nutrition Therapy Plan and Nutrition Goals:  Nutrition Therapy & Goals - 06/22/21 0919       Personal Nutrition Goals   Comments Handout provided and discussed regarding healthier choices. Review on how to choose heart healthy meals.      Intervention Plan   Intervention Nutrition handout(s) given to patient.             Nutrition Assessments:  Nutrition Assessments - 05/25/21 1008  MEDFICTS Scores   Pre Score 18             MEDIFICTS Score Key: ?70 Need to make dietary changes  40-70 Heart Healthy Diet ? 40 Therapeutic Level Cholesterol Diet   Picture Your Plate Scores: D34-534 Unhealthy dietary pattern with much room for improvement. 41-50 Dietary pattern unlikely to meet recommendations for good health and room for improvement. 51-60 More healthful dietary pattern, with some room for improvement.  >60 Healthy dietary pattern, although there may be some specific behaviors that could be improved.    Nutrition Goals Re-Evaluation:   Nutrition Goals Discharge (Final Nutrition Goals Re-Evaluation):   Psychosocial: Target Goals: Acknowledge presence or absence of significant depression and/or stress, maximize coping skills, provide positive support system. Participant is able to verbalize types and ability to use techniques and skills needed for reducing stress and depression.  Initial Review & Psychosocial Screening:  Initial Psych Review & Screening - 04/22/21 0823       Initial Review   Current issues with Current Depression;Current Anxiety/Panic      Family Dynamics   Good Support System? Yes    Comments Her brother and his family support her. She has a boyfriend who has helped her out through depression in the past. She has several friends who she hangs out with a lot.      Barriers   Psychosocial barriers to participate in program There are no identifiable barriers or psychosocial needs.      Screening Interventions   Interventions Encouraged to exercise    Expected Outcomes Short Term goal: Identification and review with participant of any Quality of Life or Depression concerns found by scoring the questionnaire.             Quality of Life Scores:  Scores of 19 and below usually indicate a poorer quality of life in these areas.  A difference of  2-3 points is a clinically meaningful difference.  A difference of 2-3 points in the total score of the Quality of Life Index has been  associated with significant improvement in overall quality of life, self-image, physical symptoms, and general health in studies assessing change in quality of life.   PHQ-9: Recent Review Flowsheet Data     Depression screen [facility] 2/9 04/22/2021   Decreased Interest 0   Down, Depressed, Hopeless 0   PHQ - 2 Score 0   Altered sleeping 0   Tired, decreased energy 0   Change in appetite 0   Feeling bad or failure about yourself  0   Trouble concentrating 0   Moving slowly or fidgety/restless 0   Suicidal thoughts 0   PHQ-9 Score 0   Difficult doing work/chores Not difficult at all      Interpretation of Total Score  Total Score Depression Severity:  1-4 = Minimal depression, 5-9 = Mild depression, 10-14 = Moderate depression, 15-19 = Moderately severe depression, 20-27 = Severe depression   Psychosocial Evaluation and Intervention:  Psychosocial Evaluation - 04/22/21 0946       Psychosocial Evaluation & Interventions   Interventions Encouraged to exercise with the program and follow exercise prescription    Comments Pt has no identifiable barriers to completing pulmonary rehab. She has been diagnosed with depression and anxiety. She takes xanax and cymbalta for this, and reports that she feels like this helps. She reports that a few months ago she was feeling very down and depressed following the death of her sister. She went to live with  her boyfriend for 4 months, and came home a few weeks ago. She reports that she is in good spirits now and has coped with all of her sadness. She scored a 0 on her PHQ-9. She states that she has a good support system with her brother and his family. She has a boyfriend of 3 years who supports her as well. She has a large circle of friends to support her. Her goals while in the program are to decrease her SOB with ADL's. She has a positive outlook and is excited to start the program.    Expected Outcomes The patient will continue to not have any  psychosocial issues. She will not have anymore depressive episodes.    Continue Psychosocial Services  No Follow up required             Psychosocial Re-Evaluation:  Psychosocial Re-Evaluation     [place] Name 04/28/21 0958 05/25/21 0953 06/22/21 0909         Psychosocial Re-Evaluation   Current issues with Current Depression;Current Anxiety/Panic Current Depression;Current Anxiety/Panic Current Depression;Current Anxiety/Panic     Comments Patient is new to the program and plans to start 6/21. We will continue to montior her progress. Patient is new in the program completing 4 sessions. Her initial PHQ-9 score was 0.  She was referred to [facility] rehabilitation by Dr. Elsworth Soho with COPD with chronic bronchitis and Centrilobular Emphysema .  Her personal goals for the program are to breath better and decrease SOB with house work and ADLs.  We will continue to montior  her progress as she works toward meeting these goals. Patient has completed 11 sessions.  She seems to enjoy coming to class and is very interative with others in class and staff.  She has a positive outlook and continues to work hard in [place].  Will continue to monitor and encorage her participation during this time in the program.     Expected Outcomes Patient will continue to have no psychosocial barriers identified. Patient will continue to have no psychosocial barriers identified. Patient will continue to have no psychosocial barriers identified.     Interventions Stress management education;Encouraged to attend Pulmonary Rehabilitation for the exercise;Relaxation education Stress management education;Encouraged to attend Pulmonary Rehabilitation for the exercise;Relaxation education Stress management education;Encouraged to attend Pulmonary Rehabilitation for the exercise;Relaxation education     Continue Psychosocial Services  No Follow up required No Follow up required No Follow up required              Psychosocial Discharge  (Final Psychosocial Re-Evaluation):  Psychosocial Re-Evaluation - 06/22/21 0909       Psychosocial Re-Evaluation   Current issues with Current Depression;Current Anxiety/Panic    Comments Patient has completed 11 sessions.  She seems to enjoy coming to class and is very interative with others in class and staff.  She has a positive outlook and continues to work hard in [place].  Will continue to monitor and encorage her participation during this time in the program.    Expected Outcomes Patient will continue to have no psychosocial barriers identified.    Interventions Stress management education;Encouraged to attend Pulmonary Rehabilitation for the exercise;Relaxation education    Continue Psychosocial Services  No Follow up required              Education: Education Goals: Education classes will be provided on a weekly basis, covering required topics. Participant will state understanding/return demonstration of topics presented.  Learning Barriers/Preferences:  Learning Barriers/Preferences - 04/22/21 0827  Learning Barriers/Preferences   Learning Barriers None    Learning Preferences Skilled Demonstration             Education Topics: How Lungs Work and Diseases: - Discuss the anatomy of the lungs and diseases that can affect the lungs, such as COPD. Flowsheet Row PULMONARY REHAB OTHER RESPIRATORY from 06/24/2021 in [place]  Date 05/20/21  Educator pb  Instruction Review Code 1- Verbalizes Understanding       Exercise: -Discuss the importance of exercise, FITT principles of exercise, normal and abnormal responses to exercise, and how to exercise safely.   Environmental Irritants: -Discuss types of environmental irritants and how to limit exposure to environmental irritants. Flowsheet Row PULMONARY REHAB OTHER RESPIRATORY from 06/24/2021 in [place]  Date 05/27/21  Educator DF  Instruction Review Code 2-  Demonstrated Understanding       Meds/Inhalers and oxygen: - Discuss respiratory medications, definition of an inhaler and oxygen, and the proper way to use an inhaler and oxygen. Flowsheet Row PULMONARY REHAB OTHER RESPIRATORY from 06/24/2021 in [place]  Date 06/03/21  Educator DJ       Energy Saving Techniques: - Discuss methods to conserve energy and decrease shortness of breath when performing activities of daily living.  Flowsheet Row PULMONARY REHAB OTHER RESPIRATORY from 06/24/2021 in [place]  Date 06/10/21  Educator pb  Instruction Review Code 1- Verbalizes Understanding       Bronchial Hygiene / Breathing Techniques: - Discuss breathing mechanics, pursed-lip breathing technique,  proper posture, effective ways to clear airways, and other functional breathing techniques Flowsheet Row PULMONARY REHAB OTHER RESPIRATORY from 06/24/2021 in [place]  Date 06/17/21  Educator Handout  Instruction Review Code 1- Research scientist (medical): - Provides group verbal and written instruction about the health risks of elevated stress, cause of high stress, and healthy ways to reduce stress. Flowsheet Row PULMONARY REHAB OTHER RESPIRATORY from 06/24/2021 in [place]  Date 06/24/21  Educator DF  Instruction Review Code 2- Demonstrated Understanding       Nutrition I: Fats: - Discuss the types of cholesterol, what cholesterol does to the body, and how cholesterol levels can be controlled.   Nutrition II: Labels: -Discuss the different components of food labels and how to read food labels.   Respiratory Infections: - Discuss the signs and symptoms of respiratory infections, ways to prevent respiratory infections, and the importance of seeking medical treatment when having a respiratory infection.   Stress I: Signs and Symptoms: - Discuss the causes of  stress, how stress may lead to anxiety and depression, and ways to limit stress.   Stress II: Relaxation: -Discuss relaxation techniques to limit stress.   Oxygen for Home/Travel: - Discuss how to prepare for travel when on oxygen and proper ways to transport and store oxygen to ensure safety.   Knowledge Questionnaire Score:  Knowledge Questionnaire Score - 04/22/21 0828       Knowledge Questionnaire Score   Pre Score 14/18             Core Components/Risk Factors/Patient Goals at Admission:  Personal Goals and Risk Factors at Admission - 04/22/21 0831       Core Components/Risk Factors/Patient Goals on Admission   Improve shortness of breath with ADL's Yes    Intervention Provide education, individualized exercise plan and daily activity instruction to help decrease symptoms of SOB with activities  of daily living.    Expected Outcomes Short Term: Improve cardiorespiratory fitness to achieve a reduction of symptoms when performing ADLs;Long Term: Be able to perform more ADLs without symptoms or delay the onset of symptoms             Core Components/Risk Factors/Patient Goals Review:   Goals and Risk Factor Review     Row Name 04/28/21 0959 05/25/21 1145 06/22/21 0923         Core Components/Risk Factors/Patient Goals Review   Personal Goals Review Improve shortness of breath with ADL's Improve shortness of breath with ADL's Improve shortness of breath with ADL's     Review Patient was referred to PR by Dr. Elsworth Soho with Centrilobular Emphysema. She plans to start the program 05/04/21. Her personal goals for the program are to decrease her SOB with ADL's. We will continue to monitor her progress as she works towards meeting these goals. Patient is new to program attending 4 sessions. She will complete the prpogram meeting both program and personal goals.She is doing well in the program with progression and consistence attendance.  O2 sats averaging 89%-93% while exercising  and no acute respiratory noted.  Encourage breathing techniques such as purse lipped breathing. Her personal goals are to decrease SOB  with ADL's.   We will continue to monitor her progress as she works towards meeting these goals and getting stronger as she progresses with the PR program. Patient has completed 11 session. She will complete the program meeting both program and personal goals.She is doing well in the program with progression and consistence attendance.  O2 sats averaging 90%-93% while exercising and no acute respiratory noted.  Encourage breathing techniques such as purse lipped breathing. Her personal goals are to decrease SOB  with ADL's.   We will continue to monitor her progress as she works towards meeting these goals and getting stronger as she progresses with the PR program.     Expected Outcomes Patient will complete the program meeting both personal and program goals. Patient will complete the program meeting both personal and program goals. Patient will complete the program meeting both personal and program goals.              Core Components/Risk Factors/Patient Goals at Discharge (Final Review):   Goals and Risk Factor Review - 06/22/21 0923       Core Components/Risk Factors/Patient Goals Review   Personal Goals Review Improve shortness of breath with ADL's    Review Patient has completed 11 session. She will complete the program meeting both program and personal goals.She is doing well in the program with progression and consistence attendance.  O2 sats averaging 90%-93% while exercising and no acute respiratory noted.  Encourage breathing techniques such as purse lipped breathing. Her personal goals are to decrease SOB  with ADL's.   We will continue to monitor her progress as she works towards meeting these goals and getting stronger as she progresses with the PR program.    Expected Outcomes Patient will complete the program meeting both personal and program goals.              ITP Comments:   Comments: ITP REVIEW Pt is making expected progress toward pulmonary rehab goals after completing 15 sessions. Recommend continued exercise, life style modification, education, and utilization of breathing techniques to increase stamina and strength and decrease shortness of breath with exertion.

## 2021-07-01 ENCOUNTER — Encounter (HOSPITAL_COMMUNITY)
Admission: RE | Admit: 2021-07-01 | Discharge: 2021-07-01 | Disposition: A | Discharge status: Home / Self Care | Payer: Medicare HMO | Source: Ambulatory Visit | Attending: Pulmonary Disease | Admitting: Pulmonary Disease

## 2021-07-01 ENCOUNTER — Other Ambulatory Visit: Payer: Self-pay

## 2021-07-01 DIAGNOSIS — J432 Centrilobular emphysema: Secondary | ICD-10-CM | POA: Diagnosis not present

## 2021-07-01 NOTE — Progress Notes (Signed)
Daily Session Note  Patient Details  Name: Brittany Miranda MRN: [id number] Date of Birth: 07/31/1951 Referring Provider:   Flowsheet Row PULMONARY REHAB OTHER RESP ORIENTATION from 04/22/2021 in [place] [facility]  Referring Provider Dr. Alva       Encounter Date: 07/01/2021  Check In:  Session Check In - 07/01/21 1330       Check-In   Supervising physician immediately available to respond to emergencies [facility] MD immediately available    Physician(s) Dr. McDowell    Location [facility]    Staff Present Debra Johnson, RN, BSN    Virtual Visit No    Medication changes reported     No    Fall or balance concerns reported    No    Tobacco Cessation No Change    Warm-up and Cool-down Performed as group-led instruction    Resistance Training Performed Yes    VAD Patient? No      Pain Assessment   Currently in Pain? No/denies    Multiple Pain Sites No             Capillary Blood Glucose: No results found for this or any previous visit (from the past 24 hour(s)).    Social History   Tobacco Use  Smoking Status Former   Packs/day: 1.00   Years: 30.00   Pack years: 30.00   Types: Cigarettes   Quit date: 04/04/1998   Years since quitting: 23.2  Smokeless Tobacco Never    Goals Met:  Proper associated with RPD/PD & O2 Sat Independence with exercise equipment Using PLB without cueing & demonstrates good technique Exercise tolerated well No report of cardiac concerns or symptoms Strength training completed today  Goals Unmet:  Not Applicable  Comments: Check out 1430.   Dr. Jehanzeb Memon is Medical Director for [facility] Pulmonary Re[facility]. 

## 2021-07-06 ENCOUNTER — Other Ambulatory Visit: Payer: Self-pay

## 2021-07-06 ENCOUNTER — Encounter (HOSPITAL_COMMUNITY)
Admission: RE | Admit: 2021-07-06 | Discharge: 2021-07-06 | Disposition: A | Discharge status: Home / Self Care | Payer: Medicare HMO | Source: Ambulatory Visit | Attending: Pulmonary Disease | Admitting: Pulmonary Disease

## 2021-07-06 DIAGNOSIS — J432 Centrilobular emphysema: Secondary | ICD-10-CM | POA: Diagnosis not present

## 2021-07-06 NOTE — Progress Notes (Signed)
Daily Session Note  Patient Details  Name: Brittany Miranda MRN: [id number] Date of Birth: [birth year] Referring Provider:   [place] from 04/22/2021 in [place]  Referring Provider Dr. Elsworth Soho       Encounter Date: 07/06/2021  Check In:  Session Check In - 07/06/21 1330       Check-In   Supervising physician immediately available to respond to emergencies [facility] MD immediately available    Physician(s) Dr. Harl Bowie    Location [facility]    Staff Present Geanie Cooley, RN;Dalton Kris Mouton, MS, ACSM-CEP, Exercise Physiologist    Virtual Visit No    Medication changes reported     No    Fall or balance concerns reported    No    Tobacco Cessation No Change    Warm-up and Cool-down Performed as group-led instruction    Resistance Training Performed Yes    VAD Patient? No    PAD/SET Patient? No      Pain Assessment   Currently in Pain? No/denies    Multiple Pain Sites No             Capillary Blood Glucose: No results found for this or any previous visit (from the past 24 hour(s)).    Social History   Tobacco Use  Smoking Status Former   Packs/day: 1.00   Years: 30.00   Pack years: 30.00   Types: Cigarettes   Quit date: 04/04/1998   Years since quitting: 23.2  Smokeless Tobacco Never    Goals Met:  Proper associated with RPD/PD & O2 Sat Independence with exercise equipment Using PLB without cueing & demonstrates good technique Exercise tolerated well No report of cardiac concerns or symptoms Strength training completed today  Goals Unmet:  Not Applicable  Comments: check out @ 2:30pm   Dr. Kathie Dike is Medical Director for [facility] Pulmonary Rehab.

## 2021-07-08 ENCOUNTER — Other Ambulatory Visit: Payer: Self-pay

## 2021-07-08 ENCOUNTER — Encounter (HOSPITAL_COMMUNITY)
Admission: RE | Admit: 2021-07-08 | Discharge: 2021-07-08 | Disposition: A | Discharge status: Home / Self Care | Payer: Medicare HMO | Source: Ambulatory Visit | Attending: Pulmonary Disease | Admitting: Pulmonary Disease

## 2021-07-08 DIAGNOSIS — J432 Centrilobular emphysema: Secondary | ICD-10-CM

## 2021-07-08 NOTE — Progress Notes (Signed)
Daily Session Note  Patient Details  Name: Brittany Miranda MRN: [id number] Date of Birth: 1951-09-15 Referring Provider:   Flowsheet Row PULMONARY REHAB OTHER RESP ORIENTATION from 04/22/2021 in [place]  Referring Provider Dr. Elsworth Soho       Encounter Date: 07/08/2021  Check In:  Session Check In - 07/08/21 1330       Check-In   Supervising physician immediately available to respond to emergencies [facility] MD immediately available    Physician(s) Dr. Harl Bowie    Location [facility]    Staff Present Geanie Cooley, RN;Debra Wynetta Emery, RN, BSN    Virtual Visit No    Medication changes reported     No    Fall or balance concerns reported    No    Tobacco Cessation No Change    Warm-up and Cool-down Performed as group-led instruction    Resistance Training Performed Yes    VAD Patient? No    PAD/SET Patient? No      Pain Assessment   Currently in Pain? No/denies    Multiple Pain Sites No             Capillary Blood Glucose: No results found for this or any previous visit (from the past 24 hour(s)).    Social History   Tobacco Use  Smoking Status Former   Packs/day: 1.00   Years: 30.00   Pack years: 30.00   Types: Cigarettes   Quit date: 04/04/1998   Years since quitting: 23.2  Smokeless Tobacco Never    Goals Met:  Independence with exercise equipment Using PLB without cueing & demonstrates good technique Exercise tolerated well No report of concerns or symptoms today Strength training completed today  Goals Unmet:  Not Applicable  Comments: check out @ 2:30pm   Dr. Kathie Dike is Medical Director for [facility] Pulmonary Rehab.

## 2021-07-13 ENCOUNTER — Encounter (HOSPITAL_COMMUNITY)
Admission: RE | Admit: 2021-07-13 | Discharge: 2021-07-13 | Disposition: A | Discharge status: Home / Self Care | Payer: Medicare HMO | Source: Ambulatory Visit | Attending: Pulmonary Disease | Admitting: Pulmonary Disease

## 2021-07-13 ENCOUNTER — Other Ambulatory Visit: Payer: Self-pay

## 2021-07-13 VITALS — Wt 91.7 lb

## 2021-07-13 DIAGNOSIS — J432 Centrilobular emphysema: Secondary | ICD-10-CM | POA: Diagnosis not present

## 2021-07-13 NOTE — Progress Notes (Signed)
I have reviewed a Home Exercise Prescription with Brittany Miranda . Brittany Miranda is currently exercising at home.  The patient was advised to walk 3 days a week for 45 minutes.  Amos and I discussed how to progress their exercise prescription.  The patient stated that their goals were to continue to increase her walking intensity.  The patient stated that they understand the exercise prescription.  We reviewed exercise guidelines, target heart rate during exercise, RPE Scale, weather conditions, NTG use, endpoints for exercise, warmup and cool down.  Patient is encouraged to come to me with any questions. I will continue to follow up with the patient to assist them with progression and safety.

## 2021-07-13 NOTE — Progress Notes (Signed)
Daily Session Note  Patient Details  Name: Brittany Miranda MRN: [id number] Date of Birth: [birth year] Referring Provider:   [place] from 04/22/2021 in [place]  Referring Provider Dr. Elsworth Soho       Encounter Date: 07/13/2021  Check In:  Session Check In - 07/13/21 1330       Check-In   Supervising physician immediately available to respond to emergencies [facility] MD immediately available    Physician(s) Dr. Domenic Polite    Location [facility]    Staff Present Geanie Cooley, RN;Dalton Fletcher, MS, ACSM-CEP, Exercise Physiologist    Virtual Visit No    Medication changes reported     No    Fall or balance concerns reported    No    Tobacco Cessation No Change    Warm-up and Cool-down Performed as group-led instruction    Resistance Training Performed Yes    VAD Patient? No    PAD/SET Patient? No      Pain Assessment   Currently in Pain? No/denies    Multiple Pain Sites No             Capillary Blood Glucose: No results found for this or any previous visit (from the past 24 hour(s)).    Social History   Tobacco Use  Smoking Status Former   Packs/day: 1.00   Years: 30.00   Pack years: 30.00   Types: Cigarettes   Quit date: 04/04/1998   Years since quitting: 23.2  Smokeless Tobacco Never    Goals Met:  Proper associated with RPD/PD & O2 Sat Independence with exercise equipment Improved SOB with ADL's Exercise tolerated well No report of concerns or symptoms today Strength training completed today  Goals Unmet:  Not Applicable  Comments: check out @ 2:30pm   Dr. Kathie Dike is Medical Director for [facility] Pulmonary Rehab.

## 2021-07-15 ENCOUNTER — Encounter (HOSPITAL_COMMUNITY)
Admission: RE | Admit: 2021-07-15 | Discharge: 2021-07-15 | Disposition: A | Discharge status: Home / Self Care | Payer: Medicare HMO | Source: Ambulatory Visit | Attending: Pulmonary Disease | Admitting: Pulmonary Disease

## 2021-07-15 ENCOUNTER — Other Ambulatory Visit: Payer: Self-pay

## 2021-07-15 DIAGNOSIS — J432 Centrilobular emphysema: Secondary | ICD-10-CM | POA: Insufficient documentation

## 2021-07-15 NOTE — Progress Notes (Signed)
Daily Session Note  Patient Details  Name: Brittany Miranda MRN: [id number] Date of Birth: 05-03-1951 Referring Provider:   Flowsheet Row PULMONARY REHAB OTHER RESP ORIENTATION from 04/22/2021 in [place]  Referring Provider Dr. Elsworth Soho       Encounter Date: 07/15/2021  Check In:  Session Check In - 07/15/21 1329       Check-In   Supervising physician immediately available to respond to emergencies [facility] MD immediately available    Physician(s) Dr. Harl Bowie    Location [facility]    Staff Present Aundra Dubin, RN, Bjorn Loser, MS, ACSM-CEP, Exercise Physiologist    Virtual Visit No    Medication changes reported     No    Fall or balance concerns reported    No    Tobacco Cessation No Change    Warm-up and Cool-down Performed as group-led instruction    Resistance Training Performed Yes    VAD Patient? No    PAD/SET Patient? No      Pain Assessment   Currently in Pain? No/denies    Multiple Pain Sites No             Capillary Blood Glucose: No results found for this or any previous visit (from the past 24 hour(s)).    Social History   Tobacco Use  Smoking Status Former   Packs/day: 1.00   Years: 30.00   Pack years: 30.00   Types: Cigarettes   Quit date: 04/04/1998   Years since quitting: 23.2  Smokeless Tobacco Never    Goals Met:  Proper associated with RPD/PD & O2 Sat Independence with exercise equipment Using PLB without cueing & demonstrates good technique Exercise tolerated well No report of concerns or symptoms today Strength training completed today  Goals Unmet:  Not Applicable  Comments: Check out 1430   Dr. Kathie Dike is Medical Director for [facility] Pul[facility] Re[facility].

## 2021-07-20 ENCOUNTER — Encounter (HOSPITAL_COMMUNITY)
Admission: RE | Admit: 2021-07-20 | Discharge: 2021-07-20 | Disposition: A | Discharge status: Home / Self Care | Payer: Medicare HMO | Source: Ambulatory Visit | Attending: Pulmonary Disease | Admitting: Pulmonary Disease

## 2021-07-20 ENCOUNTER — Encounter (HOSPITAL_COMMUNITY): Payer: Medicare HMO

## 2021-07-20 ENCOUNTER — Other Ambulatory Visit: Payer: Self-pay

## 2021-07-20 DIAGNOSIS — J432 Centrilobular emphysema: Secondary | ICD-10-CM | POA: Diagnosis not present

## 2021-07-20 NOTE — Progress Notes (Signed)
Daily Session Note  Patient Details  Name: Brittany Miranda MRN: [id number] Date of Birth: [birth year] Referring Provider:   Flowsheet Row PULMONARY REHAB OTHER RESP ORIENTATION from 04/22/2021 in [place]  Referring Provider Dr. Elsworth Soho       Encounter Date: 07/20/2021  Check In:  Session Check In - 07/20/21 1330       Check-In   Supervising physician immediately available to respond to emergencies [facility] MD immediately available    Physician(s) Dr.Ross    Location [facility]    Staff Present Geanie Cooley, RN;Dalton Kris Mouton, MS, ACSM-CEP, Exercise Physiologist    Virtual Visit No    Medication changes reported     No    Fall or balance concerns reported    No    Tobacco Cessation No Change    Warm-up and Cool-down Performed as group-led instruction    Resistance Training Performed Yes    VAD Patient? No    PAD/SET Patient? No      Pain Assessment   Currently in Pain? No/denies    Multiple Pain Sites No             Capillary Blood Glucose: No results found for this or any previous visit (from the past 24 hour(s)).    Social History   Tobacco Use  Smoking Status Former   Packs/day: 1.00   Years: 30.00   Pack years: 30.00   Types: Cigarettes   Quit date: 04/04/1998   Years since quitting: 23.3  Smokeless Tobacco Never    Goals Met:  Proper associated with RPD/PD & O2 Sat Independence with exercise equipment Improved SOB with ADL's Exercise tolerated well No report of concerns or symptoms today Strength training completed today  Goals Unmet:  Not Applicable  Comments: check out @ 3:00pm   Dr. Kathie Dike is Medical Director for [facility] Pulmonary Rehab.

## 2021-07-22 ENCOUNTER — Encounter (HOSPITAL_COMMUNITY)
Admission: RE | Admit: 2021-07-22 | Discharge: 2021-07-22 | Disposition: A | Discharge status: Home / Self Care | Payer: Medicare HMO | Source: Ambulatory Visit | Attending: Pulmonary Disease | Admitting: Pulmonary Disease

## 2021-07-22 ENCOUNTER — Other Ambulatory Visit: Payer: Self-pay

## 2021-07-22 DIAGNOSIS — J432 Centrilobular emphysema: Secondary | ICD-10-CM | POA: Diagnosis not present

## 2021-07-22 NOTE — Progress Notes (Signed)
Daily Session Note  Patient Details  Name: Brittany Miranda MRN: [id number] Date of Birth: 01-Jul-1951 Referring Provider:   Flowsheet Row PULMONARY REHAB OTHER RESP ORIENTATION from 04/22/2021 in [place]  Referring Provider Dr. Elsworth Soho       Encounter Date: 07/22/2021  Check In:  Session Check In - 07/22/21 1330       Check-In   Supervising physician immediately available to respond to emergencies [facility] immediately available    Physician(s) Dr. Harl Bowie    Location [facility]    Staff Present Geanie Cooley, RN;Debra Wynetta Emery, RN, BSN    Virtual Visit No    Medication changes reported     No    Fall or balance concerns reported    No    Tobacco Cessation No Change    Warm-up and Cool-down Performed as group-led instruction    Resistance Training Performed Yes    VAD Patient? No    PAD/SET Patient? No      Pain Assessment   Currently in Pain? No/denies    Multiple Pain Sites No             Capillary Blood Glucose: No results found for this or any previous visit (from the past 24 hour(s)).    Social History   Tobacco Use  Smoking Status Former   Packs/day: 1.00   Years: 30.00   Pack years: 30.00   Types: Cigarettes   Quit date: 04/04/1998   Years since quitting: 23.3  Smokeless Tobacco Never    Goals Met:  Proper associated with RPD/PD & O2 Sat Independence with exercise equipment Improved SOB with ADL's Exercise tolerated well No report of concerns or symptoms today Strength training completed today  Goals Unmet:  Not Applicable  Comments: check out @ 2:45pm   Dr. Kathie Dike is Medical Director for [facility] Pulmonary Rehab.

## 2021-07-27 ENCOUNTER — Encounter (HOSPITAL_COMMUNITY)
Admission: RE | Admit: 2021-07-27 | Discharge: 2021-07-27 | Disposition: A | Discharge status: Home / Self Care | Payer: Medicare HMO | Source: Ambulatory Visit | Attending: Pulmonary Disease | Admitting: Pulmonary Disease

## 2021-07-27 ENCOUNTER — Other Ambulatory Visit: Payer: Self-pay

## 2021-07-27 VITALS — Wt 92.8 lb

## 2021-07-27 DIAGNOSIS — J432 Centrilobular emphysema: Secondary | ICD-10-CM

## 2021-07-27 NOTE — Progress Notes (Signed)
Daily Session Note  Patient Details  Name: Brittany Miranda MRN: [id number] Date of Birth: 11/16/1950 Referring Provider:   [place] from 04/22/2021 in [place]  Referring Provider Dr. Elsworth Soho       Encounter Date: 07/27/2021  Check In:  Session Check In - 07/27/21 1330       Check-In   Supervising physician immediately available to respond to emergencies [facility] immediately available    Physician(s) Dr. Johnsie Cancel    Location [facility]    Staff Present Hoy Register, MS, ACSM-CEP, Exercise Physiologist;Phyllis Billingsley, RN;Other    Virtual Visit No    Medication changes reported     No    Fall or balance concerns reported    No    Tobacco Cessation No Change    Warm-up and Cool-down Performed as group-led instruction    Resistance Training Performed Yes    VAD Patient? No    PAD/SET Patient? No      Pain Assessment   Currently in Pain? No/denies    Multiple Pain Sites No             Capillary Blood Glucose: No results found for this or any previous visit (from the past 24 hour(s)).    Social History   Tobacco Use  Smoking Status Former   Packs/day: 1.00   Years: 30.00   Pack years: 30.00   Types: Cigarettes   Quit date: 04/04/1998   Years since quitting: 23.3  Smokeless Tobacco Never    Goals Met:  Independence with exercise equipment Exercise tolerated well No report of concerns or symptoms today Strength training completed today  Goals Unmet:  Not Applicable  Comments: checkout time is 1430   Dr. Kathie Dike is Medical Director for [facility] Pulmonary Rehab.

## 2021-07-28 NOTE — Progress Notes (Signed)
Pulmonary Individual Treatment Plan  Patient Details  Name: Brittany Miranda MRN: [id number] Date of Birth: May 22, 1951 Referring Provider:   Flowsheet Row PULMONARY REHAB OTHER RESP ORIENTATION from 04/22/2021 in [place]  Referring Provider Dr. Elsworth Soho       Initial Encounter Date:  Flowsheet Row PULMONARY REHAB OTHER RESP ORIENTATION from 04/22/2021 in [place]  Date 04/22/21       Visit Diagnosis: Centrilobular emphysema ([place])  Patient's Home Medications on Admission:   Current Outpatient Medications:    albuterol (PROVENTIL HFA;VENTOLIN HFA) 108 (90 Base) MCG/ACT inhaler, Inhale 1 puff into the lungs every 6 (six) hours as needed., Disp: , Rfl:    ALPRAZolam (XANAX) 0.25 MG tablet, Take 0.5 mg by mouth 3 (three) times daily as needed for anxiety., Disp: , Rfl:    Calcium Carbonate-Vitamin D (CALTRATE 600+D PO), Take 600 mg by mouth daily., Disp: , Rfl:    DULoxetine (CYMBALTA) 30 MG capsule, Take 30 mg by mouth daily., Disp: , Rfl:    ipratropium (ATROVENT) 0.02 % nebulizer solution, Take 0.5 mg by nebulization 4 (four) times daily., Disp: , Rfl:    Omega-3 Fatty Acids (FISH OIL) 1000 MG CAPS, Take 1,000 mg by mouth daily., Disp: , Rfl:    polycarbophil (FIBERCON) 625 MG tablet, Take 1,250 mg by mouth daily., Disp: , Rfl:   Past Medical History: Past Medical History:  Diagnosis Date   Anxiety    Colon polyps    adenomas in remote past    COPD (chronic obstructive pulmonary disease) ([place])    Depression    Osteopenia     Tobacco Use: Social History   Tobacco Use  Smoking Status Former   Packs/day: 1.00   Years: 30.00   Pack years: 30.00   Types: Cigarettes   Quit date: 04/04/1998   Years since quitting: 23.3  Smokeless Tobacco Never    Labs: Recent Review Flowsheet Data   There is no flowsheet data to display.     Capillary Blood Glucose: No results found for: GLUCAP   Pulmonary Assessment Scores:  Pulmonary  Assessment Scores     Row Name 04/22/21 0821         ADL UCSD   SOB Score total 19           CAT Score   CAT Score 3           mMRC Score   mMRC Score 1             UCSD: Self-administered rating of dyspnea associated with activities of daily living (ADLs) 6-point scale (0 = "not at all" to 5 = "maximal or unable to do because of breathlessness")  Scoring Scores range from 0 to 120.  Minimally important difference is 5 units  CAT: CAT can identify the health impairment of COPD patients and is better correlated with disease progression.  CAT has a scoring range of zero to 40. The CAT score is classified into four groups of low (less than 10), medium (10 - 20), high (21-30) and very high (31-40) based on the impact level of disease on health status. A CAT score over 10 suggests significant symptoms.  A worsening CAT score could be explained by an exacerbation, poor medication adherence, poor inhaler technique, or progression of COPD or comorbid conditions.  CAT MCID is 2 points  mMRC: mMRC (Modified Medical Research Council) Dyspnea Scale is used to assess the degree of baseline functional disability in patients of  respiratory disease due to dyspnea. No minimal important difference is established. A decrease in score of 1 point or greater is considered a positive change.   Pulmonary Function Assessment:   Exercise Target Goals: Exercise Program Goal: Individual exercise prescription set using results from initial 6 min walk test and THRR while considering  patient's activity barriers and safety.   Exercise Prescription Goal: Initial exercise prescription builds to 30-45 minutes a day of aerobic activity, 2-3 days per week.  Home exercise guidelines will be given to patient during program as part of exercise prescription that the participant will acknowledge.  Activity Barriers & Risk Stratification:  Activity Barriers & Cardiac Risk Stratification - 04/22/21 0822        Activity Barriers & Cardiac Risk Stratification   Activity Barriers Back Problems;Shortness of Breath;History of Falls    Cardiac Risk Stratification Low             6 Minute Walk:  6 Minute Walk     Row Name 04/22/21 0937         6 Minute Walk   Phase Initial     Distance 1000 feet     Walk Time 6 minutes     # of Rest Breaks 0     MPH 1.89     METS 2.78     RPE 9     Perceived Dyspnea  9     VO2 Peak 9.71     Symptoms No     Resting HR 80 bpm     Resting BP 122/84     Resting Oxygen Saturation  95 %     Exercise Oxygen Saturation  during 6 min walk 88 %     Max Ex. HR 97 bpm     Max Ex. BP 126/86     2 Minute Post BP 118/86           Interval HR   1 Minute HR 87     2 Minute HR 92     3 Minute HR 89     4 Minute HR 95     5 Minute HR 92     6 Minute HR 97     2 Minute Post HR 82     Interval Heart Rate? Yes           Interval Oxygen   Interval Oxygen? Yes     Baseline Oxygen Saturation % 95 %     1 Minute Oxygen Saturation % 90 %     1 Minute Liters of Oxygen 0 L     2 Minute Oxygen Saturation % 91 %     2 Minute Liters of Oxygen 0 L     3 Minute Oxygen Saturation % 90 %     3 Minute Liters of Oxygen 0 L     4 Minute Oxygen Saturation % 91 %     4 Minute Liters of Oxygen 0 L     5 Minute Oxygen Saturation % 89 %     5 Minute Liters of Oxygen 0 L     6 Minute Oxygen Saturation % 88 %     6 Minute Liters of Oxygen 0 L     2 Minute Post Oxygen Saturation % 91 %     2 Minute Post Liters of Oxygen 0 L              Oxygen Initial Assessment:  Oxygen Initial Assessment - 04/22/21 4259  Initial 6 min Walk   Oxygen Used None      Program Oxygen Prescription   Program Oxygen Prescription None      Intervention   Short Term Goals To learn and exhibit compliance with exercise, home and travel O2 prescription;To learn and understand importance of monitoring SPO2 with pulse oximeter and demonstrate accurate use of the pulse oximeter.;To  learn and understand importance of maintaining oxygen saturations>88%;To learn and demonstrate proper pursed lip breathing techniques or other breathing techniques.     Long  Term Goals Exhibits compliance with exercise, home  and travel O2 prescription;Verbalizes importance of monitoring SPO2 with pulse oximeter and return demonstration;Maintenance of O2 saturations>88%;Exhibits proper breathing techniques, such as pursed lip breathing or other method taught during program session;Compliance with respiratory medication             Oxygen Re-Evaluation:  Oxygen Re-Evaluation     Row Name 05/04/21 1530 06/01/21 1016 06/29/21 1443 07/27/21 1639       Program Oxygen Prescription   Program Oxygen Prescription None None None None         Home Oxygen   Home Oxygen Device None None None None    Sleep Oxygen Prescription None None None None    Home Exercise Oxygen Prescription None None None None    Home Resting Oxygen Prescription None None None None    Compliance with Home Oxygen Use Yes Yes Yes Yes         Goals/Expected Outcomes   Short Term Goals To learn and exhibit compliance with exercise, home and travel O2 prescription;To learn and understand importance of monitoring SPO2 with pulse oximeter and demonstrate accurate use of the pulse oximeter.;To learn and understand importance of maintaining oxygen saturations>88%;To learn and demonstrate proper pursed lip breathing techniques or other breathing techniques.  To learn and exhibit compliance with exercise, home and travel O2 prescription;To learn and understand importance of monitoring SPO2 with pulse oximeter and demonstrate accurate use of the pulse oximeter.;To learn and understand importance of maintaining oxygen saturations>88%;To learn and demonstrate proper pursed lip breathing techniques or other breathing techniques.  To learn and exhibit compliance with exercise, home and travel O2 prescription;To learn and understand importance  of monitoring SPO2 with pulse oximeter and demonstrate accurate use of the pulse oximeter.;To learn and understand importance of maintaining oxygen saturations>88%;To learn and demonstrate proper pursed lip breathing techniques or other breathing techniques.  To learn and exhibit compliance with exercise, home and travel O2 prescription;To learn and understand importance of monitoring SPO2 with pulse oximeter and demonstrate accurate use of the pulse oximeter.;To learn and understand importance of maintaining oxygen saturations>88%;To learn and demonstrate proper pursed lip breathing techniques or other breathing techniques.     Long  Term Goals Exhibits compliance with exercise, home  and travel O2 prescription;Verbalizes importance of monitoring SPO2 with pulse oximeter and return demonstration;Maintenance of O2 saturations>88%;Exhibits proper breathing techniques, such as pursed lip breathing or other method taught during program session;Compliance with respiratory medication Exhibits compliance with exercise, home  and travel O2 prescription;Verbalizes importance of monitoring SPO2 with pulse oximeter and return demonstration;Maintenance of O2 saturations>88%;Exhibits proper breathing techniques, such as pursed lip breathing or other method taught during program session;Compliance with respiratory medication Exhibits compliance with exercise, home  and travel O2 prescription;Verbalizes importance of monitoring SPO2 with pulse oximeter and return demonstration;Maintenance of O2 saturations>88%;Exhibits proper breathing techniques, such as pursed lip breathing or other method taught during program session;Compliance with respiratory medication Exhibits compliance with exercise,  home  and travel O2 prescription;Verbalizes importance of monitoring SPO2 with pulse oximeter and return demonstration;Maintenance of O2 saturations>88%;Exhibits proper breathing techniques, such as pursed lip breathing or other method  taught during program session;Compliance with respiratory medication    Goals/Expected Outcomes compliance compliance compliance compliance             Oxygen Discharge (Final Oxygen Re-Evaluation):  Oxygen Re-Evaluation - 07/27/21 1639       Program Oxygen Prescription   Program Oxygen Prescription None      Home Oxygen   Home Oxygen Device None    Sleep Oxygen Prescription None    Home Exercise Oxygen Prescription None    Home Resting Oxygen Prescription None    Compliance with Home Oxygen Use Yes      Goals/Expected Outcomes   Short Term Goals To learn and exhibit compliance with exercise, home and travel O2 prescription;To learn and understand importance of monitoring SPO2 with pulse oximeter and demonstrate accurate use of the pulse oximeter.;To learn and understand importance of maintaining oxygen saturations>88%;To learn and demonstrate proper pursed lip breathing techniques or other breathing techniques.     Long  Term Goals Exhibits compliance with exercise, home  and travel O2 prescription;Verbalizes importance of monitoring SPO2 with pulse oximeter and return demonstration;Maintenance of O2 saturations>88%;Exhibits proper breathing techniques, such as pursed lip breathing or other method taught during program session;Compliance with respiratory medication    Goals/Expected Outcomes compliance             Initial Exercise Prescription:  Initial Exercise Prescription - 04/22/21 0900       Date of Initial Exercise RX and Referring Provider   Date 04/22/21    Referring Provider Dr. Elsworth Soho    Expected Discharge Date 09/02/21      Treadmill   MPH 1.8    Grade 0    Minutes 17      NuStep   Level 1    SPM 80    Minutes 22      Prescription Details   Frequency (times per week) 2    Duration Progress to 30 minutes of continuous aerobic without signs/symptoms of physical distress      Intensity   THRR 40-80% of Max Heartrate 60-119    Ratings of Perceived  Exertion 11-13    Perceived Dyspnea 0-4      Resistance Training   Training Prescription Yes    Weight 2 lbs    Reps 10-15             Perform Capillary Blood Glucose checks as needed.  Exercise Prescription Changes:   Exercise Prescription Changes     Row Name 05/04/21 1500 05/13/21 1315 05/27/21 1016 06/15/21 1400 06/29/21 1400     Response to Exercise   Blood Pressure (Admit) 142/78 122/72 120/64 144/68 168/88   Blood Pressure (Exercise) 138/80 120/72 140/68 140/80 162/84   Blood Pressure (Exit) 112/80 120/78 128/80 140/70 140/80   Heart Rate (Admit) 98 bpm 97 bpm 97 bpm 88 bpm 82 bpm   Heart Rate (Exercise) 105 bpm 98 bpm 101 bpm 103 bpm 102 bpm   Heart Rate (Exit) 92 bpm 88 bpm 87 bpm 91 bpm 90 bpm   Oxygen Saturation (Admit) 93 % 96 % 91 % 92 % 94 %   Oxygen Saturation (Exercise) 90 % 92 % 92 % 91 % 89 %   Oxygen Saturation (Exit) 95 % 96 % 95 % 97 % 97 %   Rating of Perceived Exertion (  Exercise) '12 11 11 12 12   '$ Perceived Dyspnea (Exercise) '11 11 11 11 12   '$ Duration Continue with 30 min of aerobic exercise without signs/symptoms of physical distress. Continue with 30 min of aerobic exercise without signs/symptoms of physical distress. Continue with 30 min of aerobic exercise without signs/symptoms of physical distress. Continue with 30 min of aerobic exercise without signs/symptoms of physical distress. Continue with 30 min of aerobic exercise without signs/symptoms of physical distress.   Intensity THRR unchanged THRR unchanged THRR unchanged THRR unchanged THRR unchanged     Progression   Progression Continue to progress workloads to maintain intensity without signs/symptoms of physical distress. Continue to progress workloads to maintain intensity without signs/symptoms of physical distress. Continue to progress workloads to maintain intensity without signs/symptoms of physical distress. Continue to progress workloads to maintain intensity without signs/symptoms of  physical distress. Continue to progress workloads to maintain intensity without signs/symptoms of physical distress.     Resistance Training   Training Prescription Yes Yes Yes Yes Yes   Weight 1 lbs 3 lbs 3 lbs 3 lbs 3 lbs   Reps 10-15 10-15 10-15 10-15 10-15   Time -- -- 10 Minutes 10 Minutes 10 Minutes     Treadmill   MPH 1.8 2.2 2.3 2.4 2.6   Grade 0 0 0 0 0   Minutes '22 22 22 22 22   '$ METs 2.38 2.53 2.76 2.83 2.99     NuStep   Level '1 1 1 2 2   '$ SPM 79 82 85 94 94   Minutes '17 17 17 17 17   '$ METs 2 1.8 1.9 2.2 2.3    Row Name 07/13/21 1400 07/27/21 1600           Response to Exercise   Blood Pressure (Admit) 130/72 130/78      Blood Pressure (Exercise) 160/64 160/82      Blood Pressure (Exit) 122/68 114/72      Heart Rate (Admit) 90 bpm 85 bpm      Heart Rate (Exercise) 106 bpm 113 bpm      Heart Rate (Exit) 93 bpm 96 bpm      Oxygen Saturation (Admit) 92 % 95 %      Oxygen Saturation (Exercise) 89 % 89 %      Oxygen Saturation (Exit) 100 % 99 %      Rating of Perceived Exertion (Exercise) 12 12      Perceived Dyspnea (Exercise) 13 12      Duration Continue with 30 min of aerobic exercise without signs/symptoms of physical distress. Continue with 30 min of aerobic exercise without signs/symptoms of physical distress.      Intensity THRR unchanged THRR unchanged             Progression   Progression Continue to progress workloads to maintain intensity without signs/symptoms of physical distress. Continue to progress workloads to maintain intensity without signs/symptoms of physical distress.             Resistance Training   Training Prescription Yes Yes      Weight 3 lbs 3 lbs      Reps 10-15 10-15      Time 10 Minutes 10 Minutes             Treadmill   MPH 2.7 3      Grade 0 0      Minutes 22 22      METs 3.07 3.29  NuStep   Level 2 3      SPM 97 97      Minutes 17 17      METs 2.3 2.5             Home Exercise Plan   Plans to continue  exercise at Home (comment) --      Frequency Add 3 additional days to program exercise sessions. --      Initial Home Exercises Provided 07/13/21 --               Exercise Comments:   Exercise Comments     Row Name 07/13/21 1506           Exercise Comments home exercise reviewed                Exercise Goals and Review:   Exercise Goals     Row Name 04/22/21 0941 05/04/21 1533 06/01/21 1018 06/29/21 1444 07/27/21 1641     Exercise Goals   Increase Physical Activity Yes Yes Yes Yes Yes   Intervention Provide advice, education, support and counseling about physical activity/exercise needs.;Develop an individualized exercise prescription for aerobic and resistive training based on initial evaluation findings, risk stratification, comorbidities and participant's personal goals. Provide advice, education, support and counseling about physical activity/exercise needs.;Develop an individualized exercise prescription for aerobic and resistive training based on initial evaluation findings, risk stratification, comorbidities and participant's personal goals. Provide advice, education, support and counseling about physical activity/exercise needs.;Develop an individualized exercise prescription for aerobic and resistive training based on initial evaluation findings, risk stratification, comorbidities and participant's personal goals. Provide advice, education, support and counseling about physical activity/exercise needs.;Develop an individualized exercise prescription for aerobic and resistive training based on initial evaluation findings, risk stratification, comorbidities and participant's personal goals. Provide advice, education, support and counseling about physical activity/exercise needs.;Develop an individualized exercise prescription for aerobic and resistive training based on initial evaluation findings, risk stratification, comorbidities and participant's personal goals.    Expected Outcomes Short Term: Attend rehab on a regular basis to increase amount of physical activity.;Long Term: Add in home exercise to make exercise part of routine and to increase amount of physical activity.;Long Term: Exercising regularly at least 3-5 days a week. Short Term: Attend rehab on a regular basis to increase amount of physical activity.;Long Term: Add in home exercise to make exercise part of routine and to increase amount of physical activity.;Long Term: Exercising regularly at least 3-5 days a week. Short Term: Attend rehab on a regular basis to increase amount of physical activity.;Long Term: Add in home exercise to make exercise part of routine and to increase amount of physical activity.;Long Term: Exercising regularly at least 3-5 days a week. Short Term: Attend rehab on a regular basis to increase amount of physical activity.;Long Term: Add in home exercise to make exercise part of routine and to increase amount of physical activity.;Long Term: Exercising regularly at least 3-5 days a week. Short Term: Attend rehab on a regular basis to increase amount of physical activity.;Long Term: Add in home exercise to make exercise part of routine and to increase amount of physical activity.;Long Term: Exercising regularly at least 3-5 days a week.   Increase Strength and Stamina Yes Yes Yes Yes Yes   Intervention Provide advice, education, support and counseling about physical activity/exercise needs.;Develop an individualized exercise prescription for aerobic and resistive training based on initial evaluation findings, risk stratification, comorbidities and participant's personal goals. Provide advice, education, support and counseling about  physical activity/exercise needs.;Develop an individualized exercise prescription for aerobic and resistive training based on initial evaluation findings, risk stratification, comorbidities and participant's personal goals. Provide advice, education, support  and counseling about physical activity/exercise needs.;Develop an individualized exercise prescription for aerobic and resistive training based on initial evaluation findings, risk stratification, comorbidities and participant's personal goals. Provide advice, education, support and counseling about physical activity/exercise needs.;Develop an individualized exercise prescription for aerobic and resistive training based on initial evaluation findings, risk stratification, comorbidities and participant's personal goals. Provide advice, education, support and counseling about physical activity/exercise needs.;Develop an individualized exercise prescription for aerobic and resistive training based on initial evaluation findings, risk stratification, comorbidities and participant's personal goals.   Expected Outcomes Short Term: Increase workloads from initial exercise prescription for resistance, speed, and METs.;Short Term: Perform resistance training exercises routinely during rehab and add in resistance training at home;Long Term: Improve cardiorespiratory fitness, muscular endurance and strength as measured by increased METs and functional capacity (6MWT) Short Term: Increase workloads from initial exercise prescription for resistance, speed, and METs.;Short Term: Perform resistance training exercises routinely during rehab and add in resistance training at home;Long Term: Improve cardiorespiratory fitness, muscular endurance and strength as measured by increased METs and functional capacity (6MWT) Short Term: Increase workloads from initial exercise prescription for resistance, speed, and METs.;Short Term: Perform resistance training exercises routinely during rehab and add in resistance training at home;Long Term: Improve cardiorespiratory fitness, muscular endurance and strength as measured by increased METs and functional capacity (6MWT) Short Term: Increase workloads from initial exercise prescription for  resistance, speed, and METs.;Short Term: Perform resistance training exercises routinely during rehab and add in resistance training at home;Long Term: Improve cardiorespiratory fitness, muscular endurance and strength as measured by increased METs and functional capacity (6MWT) Short Term: Increase workloads from initial exercise prescription for resistance, speed, and METs.;Short Term: Perform resistance training exercises routinely during rehab and add in resistance training at home;Long Term: Improve cardiorespiratory fitness, muscular endurance and strength as measured by increased METs and functional capacity (6MWT)   Able to understand and use rate of perceived exertion (RPE) scale Yes Yes Yes Yes Yes   Intervention Provide education and explanation on how to use RPE scale Provide education and explanation on how to use RPE scale Provide education and explanation on how to use RPE scale Provide education and explanation on how to use RPE scale Provide education and explanation on how to use RPE scale   Expected Outcomes Short Term: Able to use RPE daily in rehab to express subjective intensity level;Long Term:  Able to use RPE to guide intensity level when exercising independently Short Term: Able to use RPE daily in rehab to express subjective intensity level;Long Term:  Able to use RPE to guide intensity level when exercising independently Short Term: Able to use RPE daily in rehab to express subjective intensity level;Long Term:  Able to use RPE to guide intensity level when exercising independently Short Term: Able to use RPE daily in rehab to express subjective intensity level;Long Term:  Able to use RPE to guide intensity level when exercising independently Short Term: Able to use RPE daily in rehab to express subjective intensity level;Long Term:  Able to use RPE to guide intensity level when exercising independently   Able to understand and use Dyspnea scale Yes Yes Yes Yes Yes   Intervention  Provide education and explanation on how to use Dyspnea scale Provide education and explanation on how to use Dyspnea scale Provide education and explanation  on how to use Dyspnea scale Provide education and explanation on how to use Dyspnea scale Provide education and explanation on how to use Dyspnea scale   Expected Outcomes Short Term: Able to use Dyspnea scale daily in rehab to express subjective sense of shortness of breath during exertion;Long Term: Able to use Dyspnea scale to guide intensity level when exercising independently Short Term: Able to use Dyspnea scale daily in rehab to express subjective sense of shortness of breath during exertion;Long Term: Able to use Dyspnea scale to guide intensity level when exercising independently Short Term: Able to use Dyspnea scale daily in rehab to express subjective sense of shortness of breath during exertion;Long Term: Able to use Dyspnea scale to guide intensity level when exercising independently Short Term: Able to use Dyspnea scale daily in rehab to express subjective sense of shortness of breath during exertion;Long Term: Able to use Dyspnea scale to guide intensity level when exercising independently Short Term: Able to use Dyspnea scale daily in rehab to express subjective sense of shortness of breath during exertion;Long Term: Able to use Dyspnea scale to guide intensity level when exercising independently   Knowledge and understanding of Target Heart Rate Range (THRR) Yes Yes Yes Yes Yes   Intervention Provide education and explanation of THRR including how the numbers were predicted and where they are located for reference Provide education and explanation of THRR including how the numbers were predicted and where they are located for reference Provide education and explanation of THRR including how the numbers were predicted and where they are located for reference Provide education and explanation of THRR including how the numbers were predicted  and where they are located for reference Provide education and explanation of THRR including how the numbers were predicted and where they are located for reference   Expected Outcomes Short Term: Able to state/look up THRR;Long Term: Able to use THRR to govern intensity when exercising independently;Short Term: Able to use daily as guideline for intensity in rehab Short Term: Able to state/look up THRR;Long Term: Able to use THRR to govern intensity when exercising independently;Short Term: Able to use daily as guideline for intensity in rehab Short Term: Able to state/look up THRR;Long Term: Able to use THRR to govern intensity when exercising independently;Short Term: Able to use daily as guideline for intensity in rehab Short Term: Able to state/look up THRR;Long Term: Able to use THRR to govern intensity when exercising independently;Short Term: Able to use daily as guideline for intensity in rehab Short Term: Able to state/look up THRR;Long Term: Able to use THRR to govern intensity when exercising independently;Short Term: Able to use daily as guideline for intensity in rehab   Understanding of Exercise Prescription Yes Yes Yes Yes Yes   Intervention Provide education, explanation, and written materials on patient's individual exercise prescription Provide education, explanation, and written materials on patient's individual exercise prescription Provide education, explanation, and written materials on patient's individual exercise prescription Provide education, explanation, and written materials on patient's individual exercise prescription Provide education, explanation, and written materials on patient's individual exercise prescription   Expected Outcomes Short Term: Able to explain program exercise prescription;Long Term: Able to explain home exercise prescription to exercise independently Short Term: Able to explain program exercise prescription;Long Term: Able to explain home exercise prescription  to exercise independently Short Term: Able to explain program exercise prescription;Long Term: Able to explain home exercise prescription to exercise independently Short Term: Able to explain program exercise prescription;Long Term: Able to explain home  exercise prescription to exercise independently Short Term: Able to explain program exercise prescription;Long Term: Able to explain home exercise prescription to exercise independently            Exercise Goals Re-Evaluation :  Exercise Goals Re-Evaluation     Row Name 05/04/21 1533 06/01/21 1018 06/29/21 1444 07/27/21 1641       Exercise Goal Re-Evaluation   Exercise Goals Review Increase Physical Activity;Increase Strength and Stamina;Able to understand and use rate of perceived exertion (RPE) scale;Understanding of Exercise Prescription;Able to check pulse independently;Knowledge and understanding of Target Heart Rate Range (THRR) Increase Physical Activity;Increase Strength and Stamina;Able to understand and use rate of perceived exertion (RPE) scale;Understanding of Exercise Prescription;Able to check pulse independently;Knowledge and understanding of Target Heart Rate Range (THRR) Increase Physical Activity;Increase Strength and Stamina;Able to understand and use rate of perceived exertion (RPE) scale;Understanding of Exercise Prescription;Able to check pulse independently;Knowledge and understanding of Target Heart Rate Range (THRR) Increase Physical Activity;Increase Strength and Stamina;Able to understand and use rate of perceived exertion (RPE) scale;Understanding of Exercise Prescription;Able to check pulse independently;Knowledge and understanding of Target Heart Rate Range (THRR)    Comments Pt has completed 1 exercise session. She is eager to participate in the program. She currently exercises at 2.0 METs on the stepper. Will continue to monitor and progress as able. Pt has completed 6 exercise sessions. She has been inconsistent with  her attendance, but has still been able to increase her intensity on the treadmill. Her oxygen sats drop sometimes on the treadmill but recover when she does pursed lip breathing. She is currently exercising at 1.9 METs on the stepper. Will continue to monitor and progress as able. Pt has completed 14 exercise sessions. Her attendance has been consistent over the past month. She has been able to increase her workloads and her SpO2 levels have remained adequate. She is currently exercising at 2.3 METs on the stepper. Will continue to monitor and progress as able. Pt has completed 22 exercise sessions. Her SpO2 levels have remained adequate and she is able to bring it up with pursed lip breathing when cued by staff. She is progressing well and increases her workloads. She is currently exercising at 2.5 METs on the stepper. Will continue to monitor and progress as able.    Expected Outcomes Through exercise at home and at rehab the patient will meet their goals. Through exercise at home and at rehab the patient will meet their goals. Through exercise at home and at rehab the patient will meet their goals. Through exercise at home and at rehab the patient will meet their goals.             Discharge Exercise Prescription (Final Exercise Prescription Changes):  Exercise Prescription Changes - 07/27/21 1600       Response to Exercise   Blood Pressure (Admit) 130/78    Blood Pressure (Exercise) 160/82    Blood Pressure (Exit) 114/72    Heart Rate (Admit) 85 bpm    Heart Rate (Exercise) 113 bpm    Heart Rate (Exit) 96 bpm    Oxygen Saturation (Admit) 95 %    Oxygen Saturation (Exercise) 89 %    Oxygen Saturation (Exit) 99 %    Rating of Perceived Exertion (Exercise) 12    Perceived Dyspnea (Exercise) 12    Duration Continue with 30 min of aerobic exercise without signs/symptoms of physical distress.    Intensity THRR unchanged      Progression   Progression Continue to  progress workloads to  maintain intensity without signs/symptoms of physical distress.      Resistance Training   Training Prescription Yes    Weight 3 lbs    Reps 10-15    Time 10 Minutes      Treadmill   MPH 3    Grade 0    Minutes 22    METs 3.29      NuStep   Level 3    SPM 97    Minutes 17    METs 2.5             Nutrition:  Target Goals: Understanding of nutrition guidelines, daily intake of sodium '1500mg'$ , cholesterol '200mg'$ , calories 30% from fat and 7% or less from saturated fats, daily to have 5 or more servings of fruits and vegetables.  Biometrics:  Pre Biometrics - 04/22/21 0942       Pre Biometrics   Height '5\' 4"'$  (1.626 m)    Weight 44 kg    Waist Circumference 29 inches    Hip Circumference 33 inches    Waist to Hip Ratio 0.88 %    BMI (Calculated) 16.64    Triceps Skinfold 5 mm    % Body Fat 22.9 %    Grip Strength 23.4 kg    Flexibility 0 in    Single Leg Stand 12.88 seconds              Nutrition Therapy Plan and Nutrition Goals:  Nutrition Therapy & Goals - 06/22/21 0919       Personal Nutrition Goals   Comments Handout provided and discussed regarding healthier choices. Review on how to choose heart healthy meals.      Intervention Plan   Intervention Nutrition handout(s) given to patient.             Nutrition Assessments:  Nutrition Assessments - 05/25/21 1008       MEDFICTS Scores   Pre Score 18            MEDIFICTS Score Key: ?70 Need to make dietary changes  40-70 Heart Healthy Diet ? 40 Therapeutic Level Cholesterol Diet   Picture Your Plate Scores: [id number] Unhealthy dietary pattern with much room for improvement. 41-50 Dietary pattern unlikely to meet recommendations for good health and room for improvement. 51-60 More healthful dietary pattern, with some room for improvement.  >60 Healthy dietary pattern, although there may be some specific behaviors that could be improved.    Nutrition Goals Re-Evaluation:   Nutrition  Goals Discharge (Final Nutrition Goals Re-Evaluation):   Psychosocial: Target Goals: Acknowledge presence or absence of significant depression and/or stress, maximize coping skills, provide positive support system. Participant is able to verbalize types and ability to use techniques and skills needed for reducing stress and depression.  Initial Review & Psychosocial Screening:  Initial Psych Review & Screening - 04/22/21 0823       Initial Review   Current issues with Current Depression;Current Anxiety/Panic      Family Dynamics   Good Support System? Yes    Comments Her brother and his family support her. She has a boyfriend who has helped her out through depression in the past. She has several friends who she hangs out with a lot.      Barriers   Psychosocial barriers to participate in program There are no identifiable barriers or psychosocial needs.      Screening Interventions   Interventions Encouraged to exercise    Expected Outcomes Short Term goal:  Identification and review with participant of any Quality of Life or Depression concerns found by scoring the questionnaire.             Quality of Life Scores:  Scores of 19 and below usually indicate a poorer quality of life in these areas.  A difference of  2-3 points is a clinically meaningful difference.  A difference of 2-3 points in the total score of the Quality of Life Index has been associated with significant improvement in overall quality of life, self-image, physical symptoms, and general health in studies assessing change in quality of life.   PHQ-9: Recent Review Flowsheet Data     Depression screen [facility] 2/9 04/22/2021   Decreased Interest 0   Down, Depressed, Hopeless 0   PHQ - 2 Score 0   Altered sleeping 0   Tired, decreased energy 0   Change in appetite 0   Feeling bad or failure about yourself  0   Trouble concentrating 0   Moving slowly or fidgety/restless 0   Suicidal thoughts 0   PHQ-9 Score 0    Difficult doing work/chores Not difficult at all      Interpretation of Total Score  Total Score Depression Severity:  1-4 = Minimal depression, 5-9 = Mild depression, 10-14 = Moderate depression, 15-19 = Moderately severe depression, 20-27 = Severe depression   Psychosocial Evaluation and Intervention:  Psychosocial Evaluation - 04/22/21 0946       Psychosocial Evaluation & Interventions   Interventions Encouraged to exercise with the program and follow exercise prescription    Comments Pt has no identifiable barriers to completing pulmonary rehab. She has been diagnosed with depression and anxiety. She takes xanax and cymbalta for this, and reports that she feels like this helps. She reports that a few months ago she was feeling very down and depressed following the death of her sister. She went to live with her boyfriend for 4 months, and came home a few weeks ago. She reports that she is in good spirits now and has coped with all of her sadness. She scored a 0 on her PHQ-9. She states that she has a good support system with her brother and his family. She has a boyfriend of 3 years who supports her as well. She has a large circle of friends to support her. Her goals while in the program are to decrease her SOB with ADL's. She has a positive outlook and is excited to start the program.    Expected Outcomes The patient will continue to not have any psychosocial issues. She will not have anymore depressive episodes.    Continue Psychosocial Services  No Follow up required             Psychosocial Re-Evaluation:  Psychosocial Re-Evaluation     [place] Name 04/28/21 0958 05/25/21 0953 06/22/21 0909 07/22/21 1452       Psychosocial Re-Evaluation   Current issues with Current Depression;Current Anxiety/Panic Current Depression;Current Anxiety/Panic Current Depression;Current Anxiety/Panic Current Depression;Current Anxiety/Panic    Comments Patient is new to the program and plans to start  6/21. We will continue to montior her progress. Patient is new in the program completing 4 sessions. Her initial PHQ-9 score was 0.  She was referred to [facility] rehabilitation by Dr. Elsworth Soho with COPD with chronic bronchitis and Centrilobular Emphysema .  Her personal goals for the program are to breath better and decrease SOB with house work and ADLs.  We will continue to montior  her progress  as she works toward meeting these goals. Patient has completed 11 sessions.  She seems to enjoy coming to class and is very interative with others in class and staff.  She has a positive outlook and continues to work hard in [place].  Will continue to monitor and encorage her participation during this time in the program. Patient has completed 21 sessions.  She continues to have no psychosocial barriers identified. She continues to enjoy coming to class and is very interative with others in class and staff.  She continues to have a positive outlook and continues to work hard in [place]. Her anxiety is managed with Alprazolam and her depression is managed with Cymbalta. She feels her psychosocial issues are well controlled.  Will continue to monitor and encorage her participation during this time in the program.    Expected Outcomes Patient will continue to have no psychosocial barriers identified. Patient will continue to have no psychosocial barriers identified. Patient will continue to have no psychosocial barriers identified. Patient will continue to have no psychosocial barriers identified.    Interventions Stress management education;Encouraged to attend Pulmonary Rehabilitation for the exercise;Relaxation education Stress management education;Encouraged to attend Pulmonary Rehabilitation for the exercise;Relaxation education Stress management education;Encouraged to attend Pulmonary Rehabilitation for the exercise;Relaxation education Stress management education;Encouraged to attend Pulmonary Rehabilitation for the  exercise;Relaxation education    Continue Psychosocial Services  No Follow up required No Follow up required No Follow up required No Follow up required             Psychosocial Discharge (Final Psychosocial Re-Evaluation):  Psychosocial Re-Evaluation - 07/22/21 1452       Psychosocial Re-Evaluation   Current issues with Current Depression;Current Anxiety/Panic    Comments Patient has completed 21 sessions.  She continues to have no psychosocial barriers identified. She continues to enjoy coming to class and is very interative with others in class and staff.  She continues to have a positive outlook and continues to work hard in [place]. Her anxiety is managed with Alprazolam and her depression is managed with Cymbalta. She feels her psychosocial issues are well controlled.  Will continue to monitor and encorage her participation during this time in the program.    Expected Outcomes Patient will continue to have no psychosocial barriers identified.    Interventions Stress management education;Encouraged to attend Pulmonary Rehabilitation for the exercise;Relaxation education    Continue Psychosocial Services  No Follow up required              Education: Education Goals: Education classes will be provided on a weekly basis, covering required topics. Participant will state understanding/return demonstration of topics presented.  Learning Barriers/Preferences:  Learning Barriers/Preferences - 04/22/21 0827       Learning Barriers/Preferences   Learning Barriers None    Learning Preferences Skilled Demonstration             Education Topics: How Lungs Work and Diseases: - Discuss the anatomy of the lungs and diseases that can affect the lungs, such as COPD. Flowsheet Row PULMONARY REHAB OTHER RESPIRATORY from 07/22/2021 in [place]  Date 05/20/21  Educator pb  Instruction Review Code 1- Verbalizes Understanding       Exercise: -Discuss the  importance of exercise, FITT principles of exercise, normal and abnormal responses to exercise, and how to exercise safely.   Environmental Irritants: -Discuss types of environmental irritants and how to limit exposure to environmental irritants. Flowsheet Row PULMONARY REHAB OTHER RESPIRATORY from 07/22/2021 in [place]  [facility]  Date 05/27/21  Educator DF  Instruction Review Code 2- Demonstrated Understanding       Meds/Inhalers and oxygen: - Discuss respiratory medications, definition of an inhaler and oxygen, and the proper way to use an inhaler and oxygen. Flowsheet Row PULMONARY REHAB OTHER RESPIRATORY from 07/22/2021 in [place]  Date 06/03/21  Educator DJ       Energy Saving Techniques: - Discuss methods to conserve energy and decrease shortness of breath when performing activities of daily living.  Flowsheet Row PULMONARY REHAB OTHER RESPIRATORY from 07/22/2021 in [place]  Date 06/10/21  Educator pb  Instruction Review Code 1- Verbalizes Understanding       Bronchial Hygiene / Breathing Techniques: - Discuss breathing mechanics, pursed-lip breathing technique,  proper posture, effective ways to clear airways, and other functional breathing techniques Flowsheet Row PULMONARY REHAB OTHER RESPIRATORY from 07/22/2021 in [place]  Date 06/17/21  Educator Handout  Instruction Review Code 1- Research scientist (medical): - Provides group verbal and written instruction about the health risks of elevated stress, cause of high stress, and healthy ways to reduce stress. Flowsheet Row PULMONARY REHAB OTHER RESPIRATORY from 07/22/2021 in [place]  Date 06/24/21  Educator DF  Instruction Review Code 2- Demonstrated Understanding       Nutrition I: Fats: - Discuss the types of cholesterol, what cholesterol does to the body, and how cholesterol  levels can be controlled. Flowsheet Row PULMONARY REHAB OTHER RESPIRATORY from 07/22/2021 in [place]  Date 07/01/21  Educator DJ  Instruction Review Code 1- Verbalizes Understanding       Nutrition II: Labels: -Discuss the different components of food labels and how to read food labels. Flowsheet Row PULMONARY REHAB OTHER RESPIRATORY from 07/22/2021 in [place]  Date 07/08/21  Educator pb  Instruction Review Code 1- Verbalizes Understanding       Respiratory Infections: - Discuss the signs and symptoms of respiratory infections, ways to prevent respiratory infections, and the importance of seeking medical treatment when having a respiratory infection. Flowsheet Row PULMONARY REHAB OTHER RESPIRATORY from 07/22/2021 in [place]  Date 07/15/21  Educator DJ  Instruction Review Code 1- Verbalizes Understanding       Stress I: Signs and Symptoms: - Discuss the causes of stress, how stress may lead to anxiety and depression, and ways to limit stress. Flowsheet Row PULMONARY REHAB OTHER RESPIRATORY from 07/22/2021 in [place]  Date 07/22/21  Educator pb  Instruction Review Code 1- Verbalizes Understanding       Stress II: Relaxation: -Discuss relaxation techniques to limit stress.   Oxygen for Home/Travel: - Discuss how to prepare for travel when on oxygen and proper ways to transport and store oxygen to ensure safety.   Knowledge Questionnaire Score:  Knowledge Questionnaire Score - 04/22/21 0828       Knowledge Questionnaire Score   Pre Score 14/18             Core Components/Risk Factors/Patient Goals at Admission:  Personal Goals and Risk Factors at Admission - 04/22/21 0831       Core Components/Risk Factors/Patient Goals on Admission   Improve shortness of breath with ADL's Yes    Intervention Provide education, individualized exercise plan and daily activity  instruction to help decrease symptoms of SOB with activities of daily living.    Expected Outcomes Short Term:  Improve cardiorespiratory fitness to achieve a reduction of symptoms when performing ADLs;Long Term: Be able to perform more ADLs without symptoms or delay the onset of symptoms             Core Components/Risk Factors/Patient Goals Review:   Goals and Risk Factor Review     Row Name 04/28/21 0959 05/25/21 1145 06/22/21 0923 07/22/21 1455       Core Components/Risk Factors/Patient Goals Review   Personal Goals Review Improve shortness of breath with ADL's Improve shortness of breath with ADL's Improve shortness of breath with ADL's Improve shortness of breath with ADL's    Review Patient was referred to PR by Dr. Elsworth Soho with Centrilobular Emphysema. She plans to start the program 05/04/21. Her personal goals for the program are to decrease her SOB with ADL's. We will continue to monitor her progress as she works towards meeting these goals. Patient is new to program attending 4 sessions. She will complete the prpogram meeting both program and personal goals.She is doing well in the program with progression and consistence attendance.  O2 sats averaging 89%-93% while exercising and no acute respiratory noted.  Encourage breathing techniques such as purse lipped breathing. Her personal goals are to decrease SOB  with ADL's.   We will continue to monitor her progress as she works towards meeting these goals and getting stronger as she progresses with the PR program. Patient has completed 11 session. She will complete the program meeting both program and personal goals.She is doing well in the program with progression and consistence attendance.  O2 sats averaging 90%-93% while exercising and no acute respiratory noted.  Encourage breathing techniques such as purse lipped breathing. Her personal goals are to decrease SOB  with ADL's.   We will continue to monitor her progress as she works towards  meeting these goals and getting stronger as she progresses with the PR program. Patient has completed 21 session maintaining her weight. She continues to do well in the program with progression and consistence attendance.  Her O2 sats are averaging  90%-93% while exercising on RA. Her blood pressure is well controlled.  Her personal goals continue to be to decrease her SOB  with ADL's. We will continue to monitor her progress as she works towards meeting these goals and getting stronger as she progresses with the PR program.    Expected Outcomes Patient will complete the program meeting both personal and program goals. Patient will complete the program meeting both personal and program goals. Patient will complete the program meeting both personal and program goals. Patient will complete the program meeting both personal and program goals.             Core Components/Risk Factors/Patient Goals at Discharge (Final Review):   Goals and Risk Factor Review - 07/22/21 1455       Core Components/Risk Factors/Patient Goals Review   Personal Goals Review Improve shortness of breath with ADL's    Review Patient has completed 21 session maintaining her weight. She continues to do well in the program with progression and consistence attendance.  Her O2 sats are averaging  90%-93% while exercising on RA. Her blood pressure is well controlled.  Her personal goals continue to be to decrease her SOB  with ADL's. We will continue to monitor her progress as she works towards meeting these goals and getting stronger as she progresses with the PR program.    Expected Outcomes Patient will complete the program meeting both personal and program  goals.             ITP Comments:   Comments: ITP REVIEW Pt is making expected progress toward pulmonary rehab goals after completing 23 sessions. Recommend continued exercise, life style modification, education, and utilization of breathing techniques to increase  stamina and strength and decrease shortness of breath with exertion.

## 2021-07-29 ENCOUNTER — Encounter (HOSPITAL_COMMUNITY)
Admission: RE | Admit: 2021-07-29 | Discharge: 2021-07-29 | Disposition: A | Discharge status: Home / Self Care | Payer: Medicare HMO | Source: Ambulatory Visit | Attending: Pulmonary Disease | Admitting: Pulmonary Disease

## 2021-07-29 ENCOUNTER — Other Ambulatory Visit: Payer: Self-pay

## 2021-07-29 DIAGNOSIS — J432 Centrilobular emphysema: Secondary | ICD-10-CM

## 2021-07-29 NOTE — Progress Notes (Signed)
Daily Session Note   Patient Details  Name: Brittany Miranda MRN: [id number] Date of Birth: 09-Aug-1951 Referring Provider:   [place] from 04/22/2021 in [place]  Referring Provider Dr. Elsworth Soho       Encounter Date: 07/29/2021  Check In:  Session Check In - 07/29/21 1330       Check-In   Supervising physician immediately available to respond to emergencies [facility] MD immediately available    Physician(s) Dr. Domenic Polite    Location [facility]    Staff Present Geanie Cooley, RN;Dalton Kris Mouton, MS, ACSM-CEP, Exercise Physiologist;Other    Virtual Visit No    Medication changes reported     No    Fall or balance concerns reported    No    Tobacco Cessation No Change    Warm-up and Cool-down Performed as group-led instruction    Resistance Training Performed Yes    VAD Patient? No    PAD/SET Patient? No      Pain Assessment   Currently in Pain? No/denies    Multiple Pain Sites No             Capillary Blood Glucose: No results found for this or any previous visit (from the past 24 hour(s)).    Social History   Tobacco Use  Smoking Status Former   Packs/day: 1.00   Years: 30.00   Pack years: 30.00   Types: Cigarettes   Quit date: 04/04/1998   Years since quitting: 23.3  Smokeless Tobacco Never    Goals Met:  Independence with exercise equipment Exercise tolerated well No report of concerns or symptoms today Strength training completed today  Goals Unmet:  Not Applicable  Comments: check out 1430   Dr. Kathie Dike is Medical Director for [facility].

## 2021-08-03 ENCOUNTER — Encounter (HOSPITAL_COMMUNITY)
Admission: RE | Admit: 2021-08-03 | Discharge: 2021-08-03 | Disposition: A | Discharge status: Home / Self Care | Payer: Medicare HMO | Source: Ambulatory Visit | Attending: Pulmonary Disease | Admitting: Pulmonary Disease

## 2021-08-03 ENCOUNTER — Other Ambulatory Visit: Payer: Self-pay

## 2021-08-03 DIAGNOSIS — J432 Centrilobular emphysema: Secondary | ICD-10-CM

## 2021-08-03 NOTE — Progress Notes (Signed)
Daily Session Note  Patient Details  Name: Brittany Miranda MRN: [id number] Date of Birth: 10/17/1951 Referring Provider:   [place] from 04/22/2021 in [place]  Referring Provider Dr. Elsworth Soho       Encounter Date: 08/03/2021  Check In:  Session Check In - 08/03/21 1330       Check-In   Supervising physician immediately available to respond to emergencies [facility] MD immediately available    Physician(s) Dr. Domenic Polite    Location [facility]    Staff Present Geanie Cooley, RN;Heather Otho Ket, BS, Exercise Physiologist;Dalton Kris Mouton, MS, ACSM-CEP, Exercise Physiologist    Virtual Visit No    Medication changes reported     No    Fall or balance concerns reported    No    Tobacco Cessation No Change    Warm-up and Cool-down Performed as group-led instruction    Resistance Training Performed Yes    VAD Patient? No    PAD/SET Patient? No      Pain Assessment   Currently in Pain? No/denies    Multiple Pain Sites No             Capillary Blood Glucose: No results found for this or any previous visit (from the past 24 hour(s)).    Social History   Tobacco Use  Smoking Status Former   Packs/day: 1.00   Years: 30.00   Pack years: 30.00   Types: Cigarettes   Quit date: 04/04/1998   Years since quitting: 23.3  Smokeless Tobacco Never    Goals Met:  Proper associated with RPD/PD & O2 Sat Independence with exercise equipment Using PLB without cueing & demonstrates good technique Exercise tolerated well No report of concerns or symptoms today Strength training completed today  Goals Unmet:  Not Applicable  Comments: check out @ 2:30pm   Dr. Kathie Dike is Medical Director for [facility].

## 2021-08-05 ENCOUNTER — Encounter (HOSPITAL_COMMUNITY)
Admission: RE | Admit: 2021-08-05 | Discharge: 2021-08-05 | Disposition: A | Discharge status: Home / Self Care | Payer: Medicare HMO | Source: Ambulatory Visit | Attending: Pulmonary Disease | Admitting: Pulmonary Disease

## 2021-08-05 ENCOUNTER — Other Ambulatory Visit: Payer: Self-pay

## 2021-08-05 DIAGNOSIS — J432 Centrilobular emphysema: Secondary | ICD-10-CM

## 2021-08-05 NOTE — Progress Notes (Signed)
Daily Session Note  Patient Details  Name: Brittany Miranda MRN: [id number] Date of Birth: 09-21-1951 Referring Provider:   Flowsheet Row PULMONARY REHAB OTHER RESP ORIENTATION from 04/22/2021 in [place]  Referring Provider Dr. Elsworth Soho       Encounter Date: 08/05/2021  Check In:  Session Check In - 08/05/21 1330       Check-In   Supervising physician immediately available to respond to emergencies [facility] immediately available    Physician(s) Dr. Harl Bowie    Location [facility]    Staff Present Aundra Dubin, RN, BSN;Heather Otho Ket, BS, Exercise Physiologist;Phyllis Billingsley, RN    Virtual Visit No    Medication changes reported     No    Fall or balance concerns reported    No    Tobacco Cessation No Change    Warm-up and Cool-down Performed as group-led instruction    Resistance Training Performed Yes    PAD/SET Patient? No      Pain Assessment   Currently in Pain? No/denies    Multiple Pain Sites No             Capillary Blood Glucose: No results found for this or any previous visit (from the past 24 hour(s)).    Social History   Tobacco Use  Smoking Status Former   Packs/day: 1.00   Years: 30.00   Pack years: 30.00   Types: Cigarettes   Quit date: 04/04/1998   Years since quitting: 23.3  Smokeless Tobacco Never    Goals Met:  Proper associated with RPD/PD & O2 Sat Independence with exercise equipment Using PLB without cueing & demonstrates good technique Exercise tolerated well No report of concerns or symptoms today Strength training completed today  Goals Unmet:  Not Applicable  Comments: Check out 1430.   Dr. Kathie Dike is Medical Director for [facility].

## 2021-08-10 ENCOUNTER — Other Ambulatory Visit: Payer: Self-pay

## 2021-08-10 ENCOUNTER — Encounter (HOSPITAL_COMMUNITY)
Admission: RE | Admit: 2021-08-10 | Discharge: 2021-08-10 | Disposition: A | Discharge status: Home / Self Care | Payer: Medicare HMO | Source: Ambulatory Visit | Attending: Pulmonary Disease | Admitting: Pulmonary Disease

## 2021-08-10 VITALS — Wt 91.9 lb

## 2021-08-10 DIAGNOSIS — J432 Centrilobular emphysema: Secondary | ICD-10-CM

## 2021-08-10 NOTE — Progress Notes (Signed)
Daily Session Note  Patient Details  Name: Brittany Miranda MRN: [id number] Date of Birth: Apr 21, 1951 Referring Provider:   [place] from 04/22/2021 in [place]  Referring Provider Dr. Elsworth Soho       Encounter Date: 08/10/2021  Check In:  Session Check In - 08/10/21 1330       Check-In   Supervising physician immediately available to respond to emergencies [facility] immediately available    Physician(s) Dr. Domenic Polite    Location [facility]    Staff Present Geanie Cooley, RN;Heather Otho Ket, BS, Exercise Physiologist;Dalton Kris Mouton, MS, ACSM-CEP, Exercise Physiologist    Virtual Visit No    Medication changes reported     No    Fall or balance concerns reported    No    Tobacco Cessation No Change    Warm-up and Cool-down Performed as group-led instruction    Resistance Training Performed Yes    VAD Patient? No    PAD/SET Patient? No      Pain Assessment   Currently in Pain? No/denies    Multiple Pain Sites No             Capillary Blood Glucose: No results found for this or any previous visit (from the past 24 hour(s)).    Social History   Tobacco Use  Smoking Status Former   Packs/day: 1.00   Years: 30.00   Pack years: 30.00   Types: Cigarettes   Quit date: 04/04/1998   Years since quitting: 23.3  Smokeless Tobacco Never    Goals Met:  Proper associated with RPD/PD & O2 Sat Independence with exercise equipment Improved SOB with ADL's Exercise tolerated well No report of concerns or symptoms today Strength training completed today  Goals Unmet:  Not Applicable  Comments: check out @ 2:30pm   Dr. Kathie Dike is Medical Director for [facility].

## 2021-08-12 ENCOUNTER — Encounter (HOSPITAL_COMMUNITY): Payer: Medicare HMO

## 2021-08-17 ENCOUNTER — Other Ambulatory Visit: Payer: Self-pay

## 2021-08-17 ENCOUNTER — Encounter (HOSPITAL_COMMUNITY)
Admission: RE | Admit: 2021-08-17 | Discharge: 2021-08-17 | Disposition: A | Discharge status: Home / Self Care | Payer: Medicare HMO | Source: Ambulatory Visit | Attending: Pulmonary Disease | Admitting: Pulmonary Disease

## 2021-08-17 DIAGNOSIS — J432 Centrilobular emphysema: Secondary | ICD-10-CM | POA: Insufficient documentation

## 2021-08-17 NOTE — Progress Notes (Signed)
Daily Session Note  Patient Details  Name: Brittany Miranda MRN: [id number] Date of Birth: 18-Jan-1951 Referring Provider:   Flowsheet Row PULMONARY REHAB OTHER RESP ORIENTATION from 04/22/2021 in [place]  Referring Provider Dr. Elsworth Soho       Encounter Date: 08/17/2021  Check In:  Session Check In - 08/17/21 1330       Check-In   Supervising physician immediately available to respond to emergencies [facility] immediately available    Physician(s) Dr. Domenic Polite    Location [facility]    Staff Present Geanie Cooley, RN;Heather Otho Ket, BS, Exercise Physiologist;Mianna Iezzi Kris Mouton, MS, ACSM-CEP, Exercise Physiologist    Virtual Visit No    Medication changes reported     No    Fall or balance concerns reported    No    Tobacco Cessation No Change    Warm-up and Cool-down Performed as group-led instruction    Resistance Training Performed Yes    VAD Patient? No    PAD/SET Patient? No      Pain Assessment   Currently in Pain? No/denies    Multiple Pain Sites No             Capillary Blood Glucose: No results found for this or any previous visit (from the past 24 hour(s)).    Social History   Tobacco Use  Smoking Status Former   Packs/day: 1.00   Years: 30.00   Pack years: 30.00   Types: Cigarettes   Quit date: 04/04/1998   Years since quitting: 23.3  Smokeless Tobacco Never    Goals Met:  Independence with exercise equipment Exercise tolerated well No report of concerns or symptoms today Strength training completed today  Goals Unmet:  Not Applicable  Comments: checkout time is 1430   Dr. Kathie Dike is Medical Director for [facility].

## 2021-08-19 ENCOUNTER — Encounter (HOSPITAL_COMMUNITY)
Admission: RE | Admit: 2021-08-19 | Discharge: 2021-08-19 | Disposition: A | Discharge status: Home / Self Care | Payer: Medicare HMO | Source: Ambulatory Visit | Attending: Pulmonary Disease | Admitting: Pulmonary Disease

## 2021-08-19 ENCOUNTER — Other Ambulatory Visit: Payer: Self-pay

## 2021-08-19 DIAGNOSIS — J432 Centrilobular emphysema: Secondary | ICD-10-CM | POA: Diagnosis not present

## 2021-08-19 NOTE — Progress Notes (Signed)
Daily Session Note  Patient Details  Name: Brittany Miranda MRN: [id number] Date of Birth: [birth year] Referring Provider:   Flowsheet Row PULMONARY REHAB OTHER RESP ORIENTATION from 04/22/2021 in [place]  Referring Provider Dr. Elsworth Soho       Encounter Date: 08/19/2021  Check In:  Session Check In - 08/19/21 1330       Check-In   Supervising physician immediately available to respond to emergencies [facility] immediately available    Physician(s) Dr. Domenic Polite    Location [facility]    Staff Present Geanie Cooley, RN;Dalton Kris Mouton, MS, ACSM-CEP, Exercise Physiologist;Heather Zigmund Daniel, Exercise Physiologist;Amato Sevillano Wynetta Emery, RN, BSN    Virtual Visit No    Medication changes reported     No    Fall or balance concerns reported    No    Tobacco Cessation No Change    Warm-up and Cool-down Performed as group-led instruction    Resistance Training Performed Yes    VAD Patient? No    PAD/SET Patient? No      Pain Assessment   Currently in Pain? No/denies    Multiple Pain Sites No             Capillary Blood Glucose: No results found for this or any previous visit (from the past 24 hour(s)).    Social History   Tobacco Use  Smoking Status Former   Packs/day: 1.00   Years: 30.00   Pack years: 30.00   Types: Cigarettes   Quit date: 04/04/1998   Years since quitting: 23.3  Smokeless Tobacco Never    Goals Met:  Independence with exercise equipment Using PLB without cueing & demonstrates good technique Exercise tolerated well No report of concerns or symptoms today Strength training completed today  Goals Unmet:  Not Applicable  Comments: Check out 1430.   Dr. Kathie Dike is Medical Director for [facility].

## 2021-08-24 ENCOUNTER — Other Ambulatory Visit: Payer: Self-pay

## 2021-08-24 ENCOUNTER — Encounter (HOSPITAL_COMMUNITY)
Admission: RE | Admit: 2021-08-24 | Discharge: 2021-08-24 | Disposition: A | Discharge status: Home / Self Care | Payer: Medicare HMO | Source: Ambulatory Visit | Attending: Pulmonary Disease | Admitting: Pulmonary Disease

## 2021-08-24 VITALS — Wt 91.7 lb

## 2021-08-24 DIAGNOSIS — J432 Centrilobular emphysema: Secondary | ICD-10-CM | POA: Diagnosis not present

## 2021-08-24 NOTE — Progress Notes (Signed)
Daily Session Note  Patient Details  Name: Brittany Miranda MRN: [id number] Date of Birth: 07/25/1951 Referring Provider:   Flowsheet Row PULMONARY REHAB OTHER RESP ORIENTATION from 04/22/2021 in [place]  Referring Provider Dr. Elsworth Soho       Encounter Date: 08/24/2021  Check In:  Session Check In - 08/24/21 1330       Check-In   Supervising physician immediately available to respond to emergencies [facility] MD immediately available    Physician(s) Dr. Marlou Porch    Location [facility]    Staff Present Geanie Cooley, RN;Heather Otho Ket, BS, Exercise Physiologist;Linetta Regner Wynetta Emery, RN, BSN    Virtual Visit No    Medication changes reported     No    Fall or balance concerns reported    No    Tobacco Cessation No Change    Warm-up and Cool-down Performed as group-led instruction    Resistance Training Performed Yes    VAD Patient? No    PAD/SET Patient? No      Pain Assessment   Currently in Pain? No/denies    Multiple Pain Sites No             Capillary Blood Glucose: No results found for this or any previous visit (from the past 24 hour(s)).    Social History   Tobacco Use  Smoking Status Former   Packs/day: 1.00   Years: 30.00   Pack years: 30.00   Types: Cigarettes   Quit date: 04/04/1998   Years since quitting: 23.4  Smokeless Tobacco Never    Goals Met:  Proper associated with RPD/PD & O2 Sat Independence with exercise equipment Using PLB without cueing & demonstrates good technique Exercise tolerated well No report of concerns or symptoms today Strength training completed today  Goals Unmet:  Not Applicable  Comments: Check out 1430.   Dr. Kathie Dike is Medical Director for [facility].

## 2021-08-25 NOTE — Progress Notes (Signed)
Pulmonary Individual Treatment Plan  Patient Details  Name: Brittany Miranda MRN: [id number] Date of Birth: 11-17-1950 Referring Provider:   Flowsheet Row PULMONARY REHAB OTHER RESP ORIENTATION from 04/22/2021 in [facility]  Referring Provider Dr. Elsworth Soho       Initial Encounter Date:  Flowsheet Row PULMONARY REHAB OTHER RESP ORIENTATION from 04/22/2021 in [facility]  Date 04/22/21       Visit Diagnosis: Centrilobular emphysema ([place])  Patient's Home Medications on Admission:   Current Outpatient Medications:    albuterol (PROVENTIL HFA;VENTOLIN HFA) 108 (90 Base) MCG/ACT inhaler, Inhale 1 puff into the lungs every 6 (six) hours as needed., Disp: , Rfl:    ALPRAZolam (XANAX) 0.25 MG tablet, Take 0.5 mg by mouth 3 (three) times daily as needed for anxiety., Disp: , Rfl:    Calcium Carbonate-Vitamin D (CALTRATE 600+D PO), Take 600 mg by mouth daily., Disp: , Rfl:    DULoxetine (CYMBALTA) 30 MG capsule, Take 30 mg by mouth daily., Disp: , Rfl:    ipratropium (ATROVENT) 0.02 % nebulizer solution, Take 0.5 mg by nebulization 4 (four) times daily., Disp: , Rfl:    Omega-3 Fatty Acids (FISH OIL) 1000 MG CAPS, Take 1,000 mg by mouth daily., Disp: , Rfl:    polycarbophil (FIBERCON) 625 MG tablet, Take 1,250 mg by mouth daily., Disp: , Rfl:   Past Medical History: Past Medical History:  Diagnosis Date   Anxiety    Colon polyps    adenomas in remote past    COPD (chronic obstructive pulmonary disease) ([place])    Depression    Osteopenia     Tobacco Use: Social History   Tobacco Use  Smoking Status Former   Packs/day: 1.00   Years: 30.00   Pack years: 30.00   Types: Cigarettes   Quit date: 04/04/1998   Years since quitting: 23.4  Smokeless Tobacco Never    Labs: Recent Review Flowsheet Data   There is no flowsheet data to display.     Capillary Blood Glucose: No results found for: GLUCAP   Pulmonary Assessment Scores:  Pulmonary  Assessment Scores     Row Name 04/22/21 0821         ADL UCSD   SOB Score total 19           CAT Score   CAT Score 3           mMRC Score   mMRC Score 1             UCSD: Self-administered rating of dyspnea associated with activities of daily living (ADLs) 6-point scale (0 = "not at all" to 5 = "maximal or unable to do because of breathlessness")  Scoring Scores range from 0 to 120.  Minimally important difference is 5 units  CAT: CAT can identify the health impairment of COPD patients and is better correlated with disease progression.  CAT has a scoring range of zero to 40. The CAT score is classified into four groups of low (less than 10), medium (10 - 20), high (21-30) and very high (31-40) based on the impact level of disease on health status. A CAT score over 10 suggests significant symptoms.  A worsening CAT score could be explained by an exacerbation, poor medication adherence, poor inhaler technique, or progression of COPD or comorbid conditions.  CAT MCID is 2 points  mMRC: mMRC (Modified Medical Research Council) Dyspnea Scale is used to assess the degree of baseline functional disability in patients of  respiratory disease due to dyspnea. No minimal important difference is established. A decrease in score of 1 point or greater is considered a positive change.   Pulmonary Function Assessment:   Exercise Target Goals: Exercise Program Goal: Individual exercise prescription set using results from initial 6 min walk test and THRR while considering  patient's activity barriers and safety.   Exercise Prescription Goal: Initial exercise prescription builds to 30-45 minutes a day of aerobic activity, 2-3 days per week.  Home exercise guidelines will be given to patient during program as part of exercise prescription that the participant will acknowledge.  Activity Barriers & Risk Stratification:  Activity Barriers & Cardiac Risk Stratification - 04/22/21 0822        Activity Barriers & Cardiac Risk Stratification   Activity Barriers Back Problems;Shortness of Breath;History of Falls    Cardiac Risk Stratification Low             6 Minute Walk:  6 Minute Walk     Row Name 04/22/21 0937         6 Minute Walk   Phase Initial     Distance 1000 feet     Walk Time 6 minutes     # of Rest Breaks 0     MPH 1.89     METS 2.78     RPE 9     Perceived Dyspnea  9     VO2 Peak 9.71     Symptoms No     Resting HR 80 bpm     Resting BP 122/84     Resting Oxygen Saturation  95 %     Exercise Oxygen Saturation  during 6 min walk 88 %     Max Ex. HR 97 bpm     Max Ex. BP 126/86     2 Minute Post BP 118/86           Interval HR   1 Minute HR 87     2 Minute HR 92     3 Minute HR 89     4 Minute HR 95     5 Minute HR 92     6 Minute HR 97     2 Minute Post HR 82     Interval Heart Rate? Yes           Interval Oxygen   Interval Oxygen? Yes     Baseline Oxygen Saturation % 95 %     1 Minute Oxygen Saturation % 90 %     1 Minute Liters of Oxygen 0 L     2 Minute Oxygen Saturation % 91 %     2 Minute Liters of Oxygen 0 L     3 Minute Oxygen Saturation % 90 %     3 Minute Liters of Oxygen 0 L     4 Minute Oxygen Saturation % 91 %     4 Minute Liters of Oxygen 0 L     5 Minute Oxygen Saturation % 89 %     5 Minute Liters of Oxygen 0 L     6 Minute Oxygen Saturation % 88 %     6 Minute Liters of Oxygen 0 L     2 Minute Post Oxygen Saturation % 91 %     2 Minute Post Liters of Oxygen 0 L              Oxygen Initial Assessment:  Oxygen Initial Assessment - 04/22/21 4259  Initial 6 min Walk   Oxygen Used None      Program Oxygen Prescription   Program Oxygen Prescription None      Intervention   Short Term Goals To learn and exhibit compliance with exercise, home and travel O2 prescription;To learn and understand importance of monitoring SPO2 with pulse oximeter and demonstrate accurate use of the pulse oximeter.;To  learn and understand importance of maintaining oxygen saturations>88%;To learn and demonstrate proper pursed lip breathing techniques or other breathing techniques.     Long  Term Goals Exhibits compliance with exercise, home  and travel O2 prescription;Verbalizes importance of monitoring SPO2 with pulse oximeter and return demonstration;Maintenance of O2 saturations>88%;Exhibits proper breathing techniques, such as pursed lip breathing or other method taught during program session;Compliance with respiratory medication             Oxygen Re-Evaluation:  Oxygen Re-Evaluation     Row Name 05/04/21 1530 06/01/21 1016 06/29/21 1443 07/27/21 1639 08/24/21 1514     Program Oxygen Prescription   Program Oxygen Prescription None None None None None     Home Oxygen   Home Oxygen Device None None None None None   Sleep Oxygen Prescription None None None None None   Home Exercise Oxygen Prescription None None None None None   Home Resting Oxygen Prescription None None None None None   Compliance with Home Oxygen Use Yes Yes Yes Yes Yes     Goals/Expected Outcomes   Short Term Goals To learn and exhibit compliance with exercise, home and travel O2 prescription;To learn and understand importance of monitoring SPO2 with pulse oximeter and demonstrate accurate use of the pulse oximeter.;To learn and understand importance of maintaining oxygen saturations>88%;To learn and demonstrate proper pursed lip breathing techniques or other breathing techniques.  To learn and exhibit compliance with exercise, home and travel O2 prescription;To learn and understand importance of monitoring SPO2 with pulse oximeter and demonstrate accurate use of the pulse oximeter.;To learn and understand importance of maintaining oxygen saturations>88%;To learn and demonstrate proper pursed lip breathing techniques or other breathing techniques.  To learn and exhibit compliance with exercise, home and travel O2 prescription;To learn  and understand importance of monitoring SPO2 with pulse oximeter and demonstrate accurate use of the pulse oximeter.;To learn and understand importance of maintaining oxygen saturations>88%;To learn and demonstrate proper pursed lip breathing techniques or other breathing techniques.  To learn and exhibit compliance with exercise, home and travel O2 prescription;To learn and understand importance of monitoring SPO2 with pulse oximeter and demonstrate accurate use of the pulse oximeter.;To learn and understand importance of maintaining oxygen saturations>88%;To learn and demonstrate proper pursed lip breathing techniques or other breathing techniques.  To learn and exhibit compliance with exercise, home and travel O2 prescription;To learn and understand importance of monitoring SPO2 with pulse oximeter and demonstrate accurate use of the pulse oximeter.;To learn and understand importance of maintaining oxygen saturations>88%;To learn and demonstrate proper pursed lip breathing techniques or other breathing techniques.    Long  Term Goals Exhibits compliance with exercise, home  and travel O2 prescription;Verbalizes importance of monitoring SPO2 with pulse oximeter and return demonstration;Maintenance of O2 saturations>88%;Exhibits proper breathing techniques, such as pursed lip breathing or other method taught during program session;Compliance with respiratory medication Exhibits compliance with exercise, home  and travel O2 prescription;Verbalizes importance of monitoring SPO2 with pulse oximeter and return demonstration;Maintenance of O2 saturations>88%;Exhibits proper breathing techniques, such as pursed lip breathing or other method taught during program session;Compliance with respiratory medication Exhibits  compliance with exercise, home  and travel O2 prescription;Verbalizes importance of monitoring SPO2 with pulse oximeter and return demonstration;Maintenance of O2 saturations>88%;Exhibits proper breathing  techniques, such as pursed lip breathing or other method taught during program session;Compliance with respiratory medication Exhibits compliance with exercise, home  and travel O2 prescription;Verbalizes importance of monitoring SPO2 with pulse oximeter and return demonstration;Maintenance of O2 saturations>88%;Exhibits proper breathing techniques, such as pursed lip breathing or other method taught during program session;Compliance with respiratory medication Exhibits compliance with exercise, home  and travel O2 prescription;Verbalizes importance of monitoring SPO2 with pulse oximeter and return demonstration;Maintenance of O2 saturations>88%;Exhibits proper breathing techniques, such as pursed lip breathing or other method taught during program session;Compliance with respiratory medication   Goals/Expected Outcomes compliance compliance compliance compliance compliance            Oxygen Discharge (Final Oxygen Re-Evaluation):  Oxygen Re-Evaluation - 08/24/21 1514       Program Oxygen Prescription   Program Oxygen Prescription None      Home Oxygen   Home Oxygen Device None    Sleep Oxygen Prescription None    Home Exercise Oxygen Prescription None    Home Resting Oxygen Prescription None    Compliance with Home Oxygen Use Yes      Goals/Expected Outcomes   Short Term Goals To learn and exhibit compliance with exercise, home and travel O2 prescription;To learn and understand importance of monitoring SPO2 with pulse oximeter and demonstrate accurate use of the pulse oximeter.;To learn and understand importance of maintaining oxygen saturations>88%;To learn and demonstrate proper pursed lip breathing techniques or other breathing techniques.     Long  Term Goals Exhibits compliance with exercise, home  and travel O2 prescription;Verbalizes importance of monitoring SPO2 with pulse oximeter and return demonstration;Maintenance of O2 saturations>88%;Exhibits proper breathing techniques, such  as pursed lip breathing or other method taught during program session;Compliance with respiratory medication    Goals/Expected Outcomes compliance             Initial Exercise Prescription:  Initial Exercise Prescription - 04/22/21 0900       Date of Initial Exercise RX and Referring Provider   Date 04/22/21    Referring Provider Dr. Elsworth Soho    Expected Discharge Date 09/02/21      Treadmill   MPH 1.8    Grade 0    Minutes 17      NuStep   Level 1    SPM 80    Minutes 22      Prescription Details   Frequency (times per week) 2    Duration Progress to 30 minutes of continuous aerobic without signs/symptoms of physical distress      Intensity   THRR 40-80% of Max Heartrate 60-119    Ratings of Perceived Exertion 11-13    Perceived Dyspnea 0-4      Resistance Training   Training Prescription Yes    Weight 2 lbs    Reps 10-15             Perform Capillary Blood Glucose checks as needed.  Exercise Prescription Changes:   Exercise Prescription Changes     Row Name 05/04/21 1500 05/13/21 1315 05/27/21 1016 06/15/21 1400 06/29/21 1400     Response to Exercise   Blood Pressure (Admit) 142/78 122/72 120/64 144/68 168/88   Blood Pressure (Exercise) 138/80 120/72 140/68 140/80 162/84   Blood Pressure (Exit) 112/80 120/78 128/80 140/70 140/80   Heart Rate (Admit) 98 bpm 97 bpm 97 bpm 88  bpm 82 bpm   Heart Rate (Exercise) 105 bpm 98 bpm 101 bpm 103 bpm 102 bpm   Heart Rate (Exit) 92 bpm 88 bpm 87 bpm 91 bpm 90 bpm   Oxygen Saturation (Admit) 93 % 96 % 91 % 92 % 94 %   Oxygen Saturation (Exercise) 90 % 92 % 92 % 91 % 89 %   Oxygen Saturation (Exit) 95 % 96 % 95 % 97 % 97 %   Rating of Perceived Exertion (Exercise) 12 11 11 12 12    Perceived Dyspnea (Exercise) 11 11 11 11 12    Duration Continue with 30 min of aerobic exercise without signs/symptoms of physical distress. Continue with 30 min of aerobic exercise without signs/symptoms of physical distress. Continue with  30 min of aerobic exercise without signs/symptoms of physical distress. Continue with 30 min of aerobic exercise without signs/symptoms of physical distress. Continue with 30 min of aerobic exercise without signs/symptoms of physical distress.   Intensity THRR unchanged THRR unchanged THRR unchanged THRR unchanged THRR unchanged     Progression   Progression Continue to progress workloads to maintain intensity without signs/symptoms of physical distress. Continue to progress workloads to maintain intensity without signs/symptoms of physical distress. Continue to progress workloads to maintain intensity without signs/symptoms of physical distress. Continue to progress workloads to maintain intensity without signs/symptoms of physical distress. Continue to progress workloads to maintain intensity without signs/symptoms of physical distress.     Resistance Training   Training Prescription Yes Yes Yes Yes Yes   Weight 1 lbs 3 lbs 3 lbs 3 lbs 3 lbs   Reps 10-15 10-15 10-15 10-15 10-15   Time -- -- 10 Minutes 10 Minutes 10 Minutes     Treadmill   MPH 1.8 2.2 2.3 2.4 2.6   Grade 0 0 0 0 0   Minutes 22 22 22 22 22    METs 2.38 2.53 2.76 2.83 2.99     NuStep   Level 1 1 1 2 2    SPM 79 82 85 94 94   Minutes 17 17 17 17 17    METs 2 1.8 1.9 2.2 2.3    Row Name 07/13/21 1400 07/27/21 1600 08/10/21 1400 08/24/21 1500       Response to Exercise   Blood Pressure (Admit) 130/72 130/78 148/88 110/60    Blood Pressure (Exercise) 160/64 160/82 150/62 155/100    Blood Pressure (Exit) 122/68 114/72 138/78 100/60    Heart Rate (Admit) 90 bpm 85 bpm 82 bpm 87 bpm    Heart Rate (Exercise) 106 bpm 113 bpm 108 bpm 110 bpm    Heart Rate (Exit) 93 bpm 96 bpm 91 bpm 101 bpm    Oxygen Saturation (Admit) 92 % 95 % 95 % 93 %    Oxygen Saturation (Exercise) 89 % 89 % 91 % 87 %    Oxygen Saturation (Exit) 100 % 99 % 95 % 97 %    Rating of Perceived Exertion (Exercise) 12 12 12 12     Perceived Dyspnea (Exercise) 13  12 12 12     Duration Continue with 30 min of aerobic exercise without signs/symptoms of physical distress. Continue with 30 min of aerobic exercise without signs/symptoms of physical distress. Continue with 30 min of aerobic exercise without signs/symptoms of physical distress. Continue with 30 min of aerobic exercise without signs/symptoms of physical distress.    Intensity THRR unchanged THRR unchanged THRR unchanged THRR unchanged         Progression   Progression  Continue to progress workloads to maintain intensity without signs/symptoms of physical distress. Continue to progress workloads to maintain intensity without signs/symptoms of physical distress. Continue to progress workloads to maintain intensity without signs/symptoms of physical distress. Continue to progress workloads to maintain intensity without signs/symptoms of physical distress.         Resistance Training   Training Prescription Yes Yes Yes Yes    Weight 3 lbs 3 lbs 3 3    Reps 10-15 10-15 10-15 10-15    Time 10 Minutes 10 Minutes 10 Minutes 10 Minutes         Treadmill   MPH 2.7 3 3  2.7    Grade 0 0 0 0    Minutes 22 22 22 22     METs 3.07 3.29 2.98 3.1         NuStep   Level 2 3 3 3     SPM 97 97 90 93    Minutes 17 17 17 17     METs 2.3 2.5 2.1 2.1         Home Exercise Plan   Plans to continue exercise at Home (comment) -- -- --    Frequency Add 3 additional days to program exercise sessions. -- -- --    Initial Home Exercises Provided 07/13/21 -- -- --             Exercise Comments:   Exercise Comments     Row Name 07/13/21 1506           Exercise Comments home exercise reviewed                Exercise Goals and Review:   Exercise Goals     Row Name 04/22/21 0941 05/04/21 1533 06/01/21 1018 06/29/21 1444 07/27/21 1641     Exercise Goals   Increase Physical Activity Yes Yes Yes Yes Yes   Intervention Provide advice, education, support and counseling about physical  activity/exercise needs.;Develop an individualized exercise prescription for aerobic and resistive training based on initial evaluation findings, risk stratification, comorbidities and participant's personal goals. Provide advice, education, support and counseling about physical activity/exercise needs.;Develop an individualized exercise prescription for aerobic and resistive training based on initial evaluation findings, risk stratification, comorbidities and participant's personal goals. Provide advice, education, support and counseling about physical activity/exercise needs.;Develop an individualized exercise prescription for aerobic and resistive training based on initial evaluation findings, risk stratification, comorbidities and participant's personal goals. Provide advice, education, support and counseling about physical activity/exercise needs.;Develop an individualized exercise prescription for aerobic and resistive training based on initial evaluation findings, risk stratification, comorbidities and participant's personal goals. Provide advice, education, support and counseling about physical activity/exercise needs.;Develop an individualized exercise prescription for aerobic and resistive training based on initial evaluation findings, risk stratification, comorbidities and participant's personal goals.   Expected Outcomes Short Term: Attend rehab on a regular basis to increase amount of physical activity.;Long Term: Add in home exercise to make exercise part of routine and to increase amount of physical activity.;Long Term: Exercising regularly at least 3-5 days a week. Short Term: Attend rehab on a regular basis to increase amount of physical activity.;Long Term: Add in home exercise to make exercise part of routine and to increase amount of physical activity.;Long Term: Exercising regularly at least 3-5 days a week. Short Term: Attend rehab on a regular basis to increase amount of physical  activity.;Long Term: Add in home exercise to make exercise part of routine and to increase amount of physical activity.;Long Term:  Exercising regularly at least 3-5 days a week. Short Term: Attend rehab on a regular basis to increase amount of physical activity.;Long Term: Add in home exercise to make exercise part of routine and to increase amount of physical activity.;Long Term: Exercising regularly at least 3-5 days a week. Short Term: Attend rehab on a regular basis to increase amount of physical activity.;Long Term: Add in home exercise to make exercise part of routine and to increase amount of physical activity.;Long Term: Exercising regularly at least 3-5 days a week.   Increase Strength and Stamina Yes Yes Yes Yes Yes   Intervention Provide advice, education, support and counseling about physical activity/exercise needs.;Develop an individualized exercise prescription for aerobic and resistive training based on initial evaluation findings, risk stratification, comorbidities and participant's personal goals. Provide advice, education, support and counseling about physical activity/exercise needs.;Develop an individualized exercise prescription for aerobic and resistive training based on initial evaluation findings, risk stratification, comorbidities and participant's personal goals. Provide advice, education, support and counseling about physical activity/exercise needs.;Develop an individualized exercise prescription for aerobic and resistive training based on initial evaluation findings, risk stratification, comorbidities and participant's personal goals. Provide advice, education, support and counseling about physical activity/exercise needs.;Develop an individualized exercise prescription for aerobic and resistive training based on initial evaluation findings, risk stratification, comorbidities and participant's personal goals. Provide advice, education, support and counseling about physical  activity/exercise needs.;Develop an individualized exercise prescription for aerobic and resistive training based on initial evaluation findings, risk stratification, comorbidities and participant's personal goals.   Expected Outcomes Short Term: Increase workloads from initial exercise prescription for resistance, speed, and METs.;Short Term: Perform resistance training exercises routinely during rehab and add in resistance training at home;Long Term: Improve cardiorespiratory fitness, muscular endurance and strength as measured by increased METs and functional capacity (6MWT) Short Term: Increase workloads from initial exercise prescription for resistance, speed, and METs.;Short Term: Perform resistance training exercises routinely during rehab and add in resistance training at home;Long Term: Improve cardiorespiratory fitness, muscular endurance and strength as measured by increased METs and functional capacity (6MWT) Short Term: Increase workloads from initial exercise prescription for resistance, speed, and METs.;Short Term: Perform resistance training exercises routinely during rehab and add in resistance training at home;Long Term: Improve cardiorespiratory fitness, muscular endurance and strength as measured by increased METs and functional capacity (6MWT) Short Term: Increase workloads from initial exercise prescription for resistance, speed, and METs.;Short Term: Perform resistance training exercises routinely during rehab and add in resistance training at home;Long Term: Improve cardiorespiratory fitness, muscular endurance and strength as measured by increased METs and functional capacity (6MWT) Short Term: Increase workloads from initial exercise prescription for resistance, speed, and METs.;Short Term: Perform resistance training exercises routinely during rehab and add in resistance training at home;Long Term: Improve cardiorespiratory fitness, muscular endurance and strength as measured by increased  METs and functional capacity (6MWT)   Able to understand and use rate of perceived exertion (RPE) scale Yes Yes Yes Yes Yes   Intervention Provide education and explanation on how to use RPE scale Provide education and explanation on how to use RPE scale Provide education and explanation on how to use RPE scale Provide education and explanation on how to use RPE scale Provide education and explanation on how to use RPE scale   Expected Outcomes Short Term: Able to use RPE daily in rehab to express subjective intensity level;Long Term:  Able to use RPE to guide intensity level when exercising independently Short Term: Able to use RPE daily in  rehab to express subjective intensity level;Long Term:  Able to use RPE to guide intensity level when exercising independently Short Term: Able to use RPE daily in rehab to express subjective intensity level;Long Term:  Able to use RPE to guide intensity level when exercising independently Short Term: Able to use RPE daily in rehab to express subjective intensity level;Long Term:  Able to use RPE to guide intensity level when exercising independently Short Term: Able to use RPE daily in rehab to express subjective intensity level;Long Term:  Able to use RPE to guide intensity level when exercising independently   Able to understand and use Dyspnea scale Yes Yes Yes Yes Yes   Intervention Provide education and explanation on how to use Dyspnea scale Provide education and explanation on how to use Dyspnea scale Provide education and explanation on how to use Dyspnea scale Provide education and explanation on how to use Dyspnea scale Provide education and explanation on how to use Dyspnea scale   Expected Outcomes Short Term: Able to use Dyspnea scale daily in rehab to express subjective sense of shortness of breath during exertion;Long Term: Able to use Dyspnea scale to guide intensity level when exercising independently Short Term: Able to use Dyspnea scale daily in rehab  to express subjective sense of shortness of breath during exertion;Long Term: Able to use Dyspnea scale to guide intensity level when exercising independently Short Term: Able to use Dyspnea scale daily in rehab to express subjective sense of shortness of breath during exertion;Long Term: Able to use Dyspnea scale to guide intensity level when exercising independently Short Term: Able to use Dyspnea scale daily in rehab to express subjective sense of shortness of breath during exertion;Long Term: Able to use Dyspnea scale to guide intensity level when exercising independently Short Term: Able to use Dyspnea scale daily in rehab to express subjective sense of shortness of breath during exertion;Long Term: Able to use Dyspnea scale to guide intensity level when exercising independently   Knowledge and understanding of Target Heart Rate Range (THRR) Yes Yes Yes Yes Yes   Intervention Provide education and explanation of THRR including how the numbers were predicted and where they are located for reference Provide education and explanation of THRR including how the numbers were predicted and where they are located for reference Provide education and explanation of THRR including how the numbers were predicted and where they are located for reference Provide education and explanation of THRR including how the numbers were predicted and where they are located for reference Provide education and explanation of THRR including how the numbers were predicted and where they are located for reference   Expected Outcomes Short Term: Able to state/look up THRR;Long Term: Able to use THRR to govern intensity when exercising independently;Short Term: Able to use daily as guideline for intensity in rehab Short Term: Able to state/look up THRR;Long Term: Able to use THRR to govern intensity when exercising independently;Short Term: Able to use daily as guideline for intensity in rehab Short Term: Able to state/look up THRR;Long  Term: Able to use THRR to govern intensity when exercising independently;Short Term: Able to use daily as guideline for intensity in rehab Short Term: Able to state/look up THRR;Long Term: Able to use THRR to govern intensity when exercising independently;Short Term: Able to use daily as guideline for intensity in rehab Short Term: Able to state/look up THRR;Long Term: Able to use THRR to govern intensity when exercising independently;Short Term: Able to use daily as guideline for intensity in  rehab   Understanding of Exercise Prescription Yes Yes Yes Yes Yes   Intervention Provide education, explanation, and written materials on patient's individual exercise prescription Provide education, explanation, and written materials on patient's individual exercise prescription Provide education, explanation, and written materials on patient's individual exercise prescription Provide education, explanation, and written materials on patient's individual exercise prescription Provide education, explanation, and written materials on patient's individual exercise prescription   Expected Outcomes Short Term: Able to explain program exercise prescription;Long Term: Able to explain home exercise prescription to exercise independently Short Term: Able to explain program exercise prescription;Long Term: Able to explain home exercise prescription to exercise independently Short Term: Able to explain program exercise prescription;Long Term: Able to explain home exercise prescription to exercise independently Short Term: Able to explain program exercise prescription;Long Term: Able to explain home exercise prescription to exercise independently Short Term: Able to explain program exercise prescription;Long Term: Able to explain home exercise prescription to exercise independently    Lindsay Name 08/24/21 1508             Exercise Goals   Increase Physical Activity Yes       Intervention Provide advice, education, support and  counseling about physical activity/exercise needs.;Develop an individualized exercise prescription for aerobic and resistive training based on initial evaluation findings, risk stratification, comorbidities and participant's personal goals.       Expected Outcomes Short Term: Attend rehab on a regular basis to increase amount of physical activity.;Long Term: Add in home exercise to make exercise part of routine and to increase amount of physical activity.;Long Term: Exercising regularly at least 3-5 days a week.       Increase Strength and Stamina Yes       Intervention Provide advice, education, support and counseling about physical activity/exercise needs.;Develop an individualized exercise prescription for aerobic and resistive training based on initial evaluation findings, risk stratification, comorbidities and participant's personal goals.       Expected Outcomes Short Term: Increase workloads from initial exercise prescription for resistance, speed, and METs.;Short Term: Perform resistance training exercises routinely during rehab and add in resistance training at home;Long Term: Improve cardiorespiratory fitness, muscular endurance and strength as measured by increased METs and functional capacity (6MWT)       Able to understand and use rate of perceived exertion (RPE) scale Yes       Intervention Provide education and explanation on how to use RPE scale       Expected Outcomes Short Term: Able to use RPE daily in rehab to express subjective intensity level;Long Term:  Able to use RPE to guide intensity level when exercising independently       Able to understand and use Dyspnea scale Yes       Intervention Provide education and explanation on how to use Dyspnea scale       Expected Outcomes Short Term: Able to use Dyspnea scale daily in rehab to express subjective sense of shortness of breath during exertion;Long Term: Able to use Dyspnea scale to guide intensity level when exercising independently        Knowledge and understanding of Target Heart Rate Range (THRR) Yes       Intervention Provide education and explanation of THRR including how the numbers were predicted and where they are located for reference       Expected Outcomes Short Term: Able to state/look up THRR;Long Term: Able to use THRR to govern intensity when exercising independently;Short Term: Able to use daily as guideline for  intensity in rehab       Understanding of Exercise Prescription Yes       Intervention Provide education, explanation, and written materials on patient's individual exercise prescription       Expected Outcomes Short Term: Able to explain program exercise prescription;Long Term: Able to explain home exercise prescription to exercise independently                Exercise Goals Re-Evaluation :  Exercise Goals Re-Evaluation     Row Name 05/04/21 1533 06/01/21 1018 06/29/21 1444 07/27/21 1641 08/24/21 1510     Exercise Goal Re-Evaluation   Exercise Goals Review Increase Physical Activity;Increase Strength and Stamina;Able to understand and use rate of perceived exertion (RPE) scale;Understanding of Exercise Prescription;Able to check pulse independently;Knowledge and understanding of Target Heart Rate Range (THRR) Increase Physical Activity;Increase Strength and Stamina;Able to understand and use rate of perceived exertion (RPE) scale;Understanding of Exercise Prescription;Able to check pulse independently;Knowledge and understanding of Target Heart Rate Range (THRR) Increase Physical Activity;Increase Strength and Stamina;Able to understand and use rate of perceived exertion (RPE) scale;Understanding of Exercise Prescription;Able to check pulse independently;Knowledge and understanding of Target Heart Rate Range (THRR) Increase Physical Activity;Increase Strength and Stamina;Able to understand and use rate of perceived exertion (RPE) scale;Understanding of Exercise Prescription;Able to check pulse  independently;Knowledge and understanding of Target Heart Rate Range (THRR) Increase Physical Activity;Increase Strength and Stamina;Able to understand and use rate of perceived exertion (RPE) scale;Understanding of Exercise Prescription;Able to check pulse independently;Knowledge and understanding of Target Heart Rate Range (THRR)   Comments Pt has completed 1 exercise session. She is eager to participate in the program. She currently exercises at 2.0 METs on the stepper. Will continue to monitor and progress as able. Pt has completed 6 exercise sessions. She has been inconsistent with her attendance, but has still been able to increase her intensity on the treadmill. Her oxygen sats drop sometimes on the treadmill but recover when she does pursed lip breathing. She is currently exercising at 1.9 METs on the stepper. Will continue to monitor and progress as able. Pt has completed 14 exercise sessions. Her attendance has been consistent over the past month. She has been able to increase her workloads and her SpO2 levels have remained adequate. She is currently exercising at 2.3 METs on the stepper. Will continue to monitor and progress as able. Pt has completed 22 exercise sessions. Her SpO2 levels have remained adequate and she is able to bring it up with pursed lip breathing when cued by staff. She is progressing well and increases her workloads. She is currently exercising at 2.5 METs on the stepper. Will continue to monitor and progress as able. Pt has completed 29 sessions of cardiac rehab. She continues to increase her work load and is progressing well. She is exercising outside of sessions at home. She is currently exercising at 3.1 METs on the TM. Will continue to monitor and progress as able.   Expected Outcomes Through exercise at home and at rehab the patient will meet their goals. Through exercise at home and at rehab the patient will meet their goals. Through exercise at home and at rehab the patient  will meet their goals. Through exercise at home and at rehab the patient will meet their goals. Through exercise at home and at rehab the patient will meet their goals.            Discharge Exercise Prescription (Final Exercise Prescription Changes):  Exercise Prescription Changes - 08/24/21 1500  Response to Exercise   Blood Pressure (Admit) 110/60    Blood Pressure (Exercise) 155/100    Blood Pressure (Exit) 100/60    Heart Rate (Admit) 87 bpm    Heart Rate (Exercise) 110 bpm    Heart Rate (Exit) 101 bpm    Oxygen Saturation (Admit) 93 %    Oxygen Saturation (Exercise) 87 %    Oxygen Saturation (Exit) 97 %    Rating of Perceived Exertion (Exercise) 12    Perceived Dyspnea (Exercise) 12    Duration Continue with 30 min of aerobic exercise without signs/symptoms of physical distress.    Intensity THRR unchanged      Progression   Progression Continue to progress workloads to maintain intensity without signs/symptoms of physical distress.      Resistance Training   Training Prescription Yes    Weight 3    Reps 10-15    Time 10 Minutes      Treadmill   MPH 2.7    Grade 0    Minutes 22    METs 3.1      NuStep   Level 3    SPM 93    Minutes 17    METs 2.1             Nutrition:  Target Goals: Understanding of nutrition guidelines, daily intake of sodium 1500mg , cholesterol 200mg , calories 30% from fat and 7% or less from saturated fats, daily to have 5 or more servings of fruits and vegetables.  Biometrics:  Pre Biometrics - 04/22/21 0942       Pre Biometrics   Height 5\' 4"  (1.626 m)    Weight 44 kg    Waist Circumference 29 inches    Hip Circumference 33 inches    Waist to Hip Ratio 0.88 %    BMI (Calculated) 16.64    Triceps Skinfold 5 mm    % Body Fat 22.9 %    Grip Strength 23.4 kg    Flexibility 0 in    Single Leg Stand 12.88 seconds              Nutrition Therapy Plan and Nutrition Goals:  Nutrition Therapy & Goals - 06/22/21  0919       Personal Nutrition Goals   Comments Handout provided and discussed regarding healthier choices. Review on how to choose heart healthy meals.      Intervention Plan   Intervention Nutrition handout(s) given to patient.             Nutrition Assessments:  Nutrition Assessments - 05/25/21 1008       MEDFICTS Scores   Pre Score 18            MEDIFICTS Score Key: ?70 Need to make dietary changes  40-70 Heart Healthy Diet ? 40 Therapeutic Level Cholesterol Diet   Picture Your Plate Scores: <16 Unhealthy dietary pattern with much room for improvement. 41-50 Dietary pattern unlikely to meet recommendations for good health and room for improvement. 51-60 More healthful dietary pattern, with some room for improvement.  >60 Healthy dietary pattern, although there may be some specific behaviors that could be improved.    Nutrition Goals Re-Evaluation:   Nutrition Goals Discharge (Final Nutrition Goals Re-Evaluation):   Psychosocial: Target Goals: Acknowledge presence or absence of significant depression and/or stress, maximize coping skills, provide positive support system. Participant is able to verbalize types and ability to use techniques and skills needed for reducing stress and depression.  Initial Review & Psychosocial  Screening:  Initial Psych Review & Screening - 04/22/21 0823       Initial Review   Current issues with Current Depression;Current Anxiety/Panic      Family Dynamics   Good Support System? Yes    Comments Her brother and his family support her. She has a boyfriend who has helped her out through depression in the past. She has several friends who she hangs out with a lot.      Barriers   Psychosocial barriers to participate in program There are no identifiable barriers or psychosocial needs.      Screening Interventions   Interventions Encouraged to exercise    Expected Outcomes Short Term goal: Identification and review with  participant of any Quality of Life or Depression concerns found by scoring the questionnaire.             Quality of Life Scores:  Scores of 19 and below usually indicate a poorer quality of life in these areas.  A difference of  2-3 points is a clinically meaningful difference.  A difference of 2-3 points in the total score of the Quality of Life Index has been associated with significant improvement in overall quality of life, self-image, physical symptoms, and general health in studies assessing change in quality of life.   PHQ-9: Recent Review Flowsheet Data     Depression screen [facility] 2/9 04/22/2021   Decreased Interest 0   Down, Depressed, Hopeless 0   PHQ - 2 Score 0   Altered sleeping 0   Tired, decreased energy 0   Change in appetite 0   Feeling bad or failure about yourself  0   Trouble concentrating 0   Moving slowly or fidgety/restless 0   Suicidal thoughts 0   PHQ-9 Score 0   Difficult doing work/chores Not difficult at all      Interpretation of Total Score  Total Score Depression Severity:  1-4 = Minimal depression, 5-9 = Mild depression, 10-14 = Moderate depression, 15-19 = Moderately severe depression, 20-27 = Severe depression   Psychosocial Evaluation and Intervention:  Psychosocial Evaluation - 04/22/21 0946       Psychosocial Evaluation & Interventions   Interventions Encouraged to exercise with the program and follow exercise prescription    Comments Pt has no identifiable barriers to completing pulmonary rehab. She has been diagnosed with depression and anxiety. She takes xanax and cymbalta for this, and reports that she feels like this helps. She reports that a few months ago she was feeling very down and depressed following the death of her sister. She went to live with her boyfriend for 4 months, and came home a few weeks ago. She reports that she is in good spirits now and has coped with all of her sadness. She scored a 0 on her PHQ-9. She states that  she has a good support system with her brother and his family. She has a boyfriend of 3 years who supports her as well. She has a large circle of friends to support her. Her goals while in the program are to decrease her SOB with ADL's. She has a positive outlook and is excited to start the program.    Expected Outcomes The patient will continue to not have any psychosocial issues. She will not have anymore depressive episodes.    Continue Psychosocial Services  No Follow up required             Psychosocial Re-Evaluation:  Psychosocial Re-Evaluation     Row  Name 04/28/21 9379 05/25/21 0953 06/22/21 0909 07/22/21 1452 08/18/21 1424     Psychosocial Re-Evaluation   Current issues with Current Depression;Current Anxiety/Panic Current Depression;Current Anxiety/Panic Current Depression;Current Anxiety/Panic Current Depression;Current Anxiety/Panic Current Depression;Current Anxiety/Panic   Comments Patient is new to the program and plans to start 6/21. We will continue to montior her progress. Patient is new in the program completing 4 sessions. Her initial PHQ-9 score was 0.  She was referred to [facility] rehabilitation by Dr. Elsworth Soho with COPD with chronic bronchitis and Centrilobular Emphysema .  Her personal goals for the program are to breath better and decrease SOB with house work and ADLs.  We will continue to montior  her progress as she works toward meeting these goals. Patient has completed 11 sessions.  She seems to enjoy coming to class and is very interative with others in class and staff.  She has a positive outlook and continues to work hard in [place].  Will continue to monitor and encorage her participation during this time in the program. Patient has completed 21 sessions.  She continues to have no psychosocial barriers identified. She continues to enjoy coming to class and is very interative with others in class and staff.  She continues to have a positive outlook and continues to work hard  in [place]. Her anxiety is managed with Alprazolam and her depression is managed with Cymbalta. She feels her psychosocial issues are well controlled.  Will continue to monitor and encorage her participation during this time in the program. Patient has completed 27 sessions. She continues to have no psychosocial barriers identified. She continues to enjoy coming to class and is very interative with others in class and staff.  She continues to have a positive outlook and continues to work hard in [place]. Her anxiety is managed with Alprazolam and her depression is managed with Cymbalta. She feels her psychosocial issues are well controlled.  Will continue to monitor.   Expected Outcomes Patient will continue to have no psychosocial barriers identified. Patient will continue to have no psychosocial barriers identified. Patient will continue to have no psychosocial barriers identified. Patient will continue to have no psychosocial barriers identified. Patient will continue to have no psychosocial barriers identified.   Interventions Stress management education;Encouraged to attend Pulmonary Rehabilitation for the exercise;Relaxation education Stress management education;Encouraged to attend Pulmonary Rehabilitation for the exercise;Relaxation education Stress management education;Encouraged to attend Pulmonary Rehabilitation for the exercise;Relaxation education Stress management education;Encouraged to attend Pulmonary Rehabilitation for the exercise;Relaxation education Stress management education;Encouraged to attend Pulmonary Rehabilitation for the exercise;Relaxation education   Continue Psychosocial Services  No Follow up required No Follow up required No Follow up required No Follow up required No Follow up required            Psychosocial Discharge (Final Psychosocial Re-Evaluation):  Psychosocial Re-Evaluation - 08/18/21 1424       Psychosocial Re-Evaluation   Current issues with Current  Depression;Current Anxiety/Panic    Comments Patient has completed 27 sessions. She continues to have no psychosocial barriers identified. She continues to enjoy coming to class and is very interative with others in class and staff.  She continues to have a positive outlook and continues to work hard in [place]. Her anxiety is managed with Alprazolam and her depression is managed with Cymbalta. She feels her psychosocial issues are well controlled.  Will continue to monitor.    Expected Outcomes Patient will continue to have no psychosocial barriers identified.    Interventions Stress management education;Encouraged  to attend Pulmonary Rehabilitation for the exercise;Relaxation education    Continue Psychosocial Services  No Follow up required              Education: Education Goals: Education classes will be provided on a weekly basis, covering required topics. Participant will state understanding/return demonstration of topics presented.  Learning Barriers/Preferences:  Learning Barriers/Preferences - 04/22/21 0827       Learning Barriers/Preferences   Learning Barriers None    Learning Preferences Skilled Demonstration             Education Topics: How Lungs Work and Diseases: - Discuss the anatomy of the lungs and diseases that can affect the lungs, such as COPD. Flowsheet Row PULMONARY REHAB OTHER RESPIRATORY from 07/29/2021 in [place]  Date 05/20/21  Educator pb  Instruction Review Code 1- Verbalizes Understanding       Exercise: -Discuss the importance of exercise, FITT principles of exercise, normal and abnormal responses to exercise, and how to exercise safely.   Environmental Irritants: -Discuss types of environmental irritants and how to limit exposure to environmental irritants. Flowsheet Row PULMONARY REHAB OTHER RESPIRATORY from 07/29/2021 in [place]  Date 05/27/21  Educator DF  Instruction Review Code 2-  Demonstrated Understanding       Meds/Inhalers and oxygen: - Discuss respiratory medications, definition of an inhaler and oxygen, and the proper way to use an inhaler and oxygen. Flowsheet Row PULMONARY REHAB OTHER RESPIRATORY from 07/29/2021 in [place]  Date 06/03/21  Educator DJ       Energy Saving Techniques: - Discuss methods to conserve energy and decrease shortness of breath when performing activities of daily living.  Flowsheet Row PULMONARY REHAB OTHER RESPIRATORY from 07/29/2021 in [place]  Date 06/10/21  Educator pb  Instruction Review Code 1- Verbalizes Understanding       Bronchial Hygiene / Breathing Techniques: - Discuss breathing mechanics, pursed-lip breathing technique,  proper posture, effective ways to clear airways, and other functional breathing techniques Flowsheet Row PULMONARY REHAB OTHER RESPIRATORY from 07/29/2021 in [place]  Date 06/17/21  Educator Handout  Instruction Review Code 1- Research scientist (medical): - Provides group verbal and written instruction about the health risks of elevated stress, cause of high stress, and healthy ways to reduce stress. Flowsheet Row PULMONARY REHAB OTHER RESPIRATORY from 07/29/2021 in [place]  Date 06/24/21  Educator DF  Instruction Review Code 2- Demonstrated Understanding       Nutrition I: Fats: - Discuss the types of cholesterol, what cholesterol does to the body, and how cholesterol levels can be controlled. Flowsheet Row PULMONARY REHAB OTHER RESPIRATORY from 07/29/2021 in [place]  Date 07/01/21  Educator DJ  Instruction Review Code 1- Verbalizes Understanding       Nutrition II: Labels: -Discuss the different components of food labels and how to read food labels. Flowsheet Row PULMONARY REHAB OTHER RESPIRATORY from 07/29/2021 in [place]  Date 07/08/21  Educator pb  Instruction Review Code 1- Verbalizes Understanding       Respiratory Infections: - Discuss the signs and symptoms of respiratory infections, ways to prevent respiratory infections, and the importance of seeking medical treatment when having a respiratory infection. Flowsheet Row PULMONARY REHAB OTHER RESPIRATORY from 07/29/2021 in [place]  Date 07/15/21  Educator DJ  Instruction Review Code 1- Verbalizes Understanding  Stress I: Signs and Symptoms: - Discuss the causes of stress, how stress may lead to anxiety and depression, and ways to limit stress. Flowsheet Row PULMONARY REHAB OTHER RESPIRATORY from 07/29/2021 in [place]  Date 07/22/21  Educator pb  Instruction Review Code 1- Verbalizes Understanding       Stress II: Relaxation: -Discuss relaxation techniques to limit stress. Flowsheet Row PULMONARY REHAB OTHER RESPIRATORY from 07/29/2021 in [place]  Date 07/29/21  Educator [place]  Instruction Review Code 2- Demonstrated Understanding       Oxygen for Home/Travel: - Discuss how to prepare for travel when on oxygen and proper ways to transport and store oxygen to ensure safety.   Knowledge Questionnaire Score:  Knowledge Questionnaire Score - 04/22/21 0828       Knowledge Questionnaire Score   Pre Score 14/18             Core Components/Risk Factors/Patient Goals at Admission:  Personal Goals and Risk Factors at Admission - 04/22/21 0831       Core Components/Risk Factors/Patient Goals on Admission   Improve shortness of breath with ADL's Yes    Intervention Provide education, individualized exercise plan and daily activity instruction to help decrease symptoms of SOB with activities of daily living.    Expected Outcomes Short Term: Improve cardiorespiratory fitness to achieve a reduction of symptoms when performing ADLs;Long  Term: Be able to perform more ADLs without symptoms or delay the onset of symptoms             Core Components/Risk Factors/Patient Goals Review:   Goals and Risk Factor Review     Row Name 04/28/21 0959 05/25/21 1145 06/22/21 0923 07/22/21 1455 08/18/21 1426     Core Components/Risk Factors/Patient Goals Review   Personal Goals Review Improve shortness of breath with ADL's Improve shortness of breath with ADL's Improve shortness of breath with ADL's Improve shortness of breath with ADL's Improve shortness of breath with ADL's   Review Patient was referred to PR by Dr. Elsworth Soho with [facility]. She plans to start the program 05/04/21. Her personal goals for the program are to decrease her SOB with ADL's. We will continue to monitor her progress as she works towards meeting these goals. Patient is new to program attending 4 sessions. She will complete the prpogram meeting both program and personal goals.She is doing well in the program with progression and consistence attendance.  O2 sats averaging 89%-93% while exercising and no acute respiratory noted.  Encourage breathing techniques such as purse lipped breathing. Her personal goals are to decrease SOB  with ADL's.   We will continue to monitor her progress as she works towards meeting these goals and getting stronger as she progresses with the PR program. Patient has completed 11 session. She will complete the program meeting both program and personal goals.She is doing well in the program with progression and consistence attendance.  O2 sats averaging 90%-93% while exercising and no acute respiratory noted.  Encourage breathing techniques such as purse lipped breathing. Her personal goals are to decrease SOB  with ADL's.   We will continue to monitor her progress as she works towards meeting these goals and getting stronger as she progresses with the PR program. Patient has completed 21 session maintaining her weight. She continues to do  well in the program with progression and consistence attendance.  Her O2 sats are averaging  90%-93% while exercising on RA. Her blood pressure is well controlled.  Her personal goals continue to be to decrease her SOB  with ADL's. We will continue to monitor her progress as she works towards meeting these goals and getting stronger as she progresses with the PR program. Patient has completed 27 session maintaining her weight. She continues to do well in the program with progression and consistent attendance.  Her O2 sats are averaging  90%-93% while exercising on RA. Her blood pressure is well controlled.  Her personal goals continue to be to decrease her SOB  with ADL's. We will continue to monitor her progress as she works towards meeting these goals and getting stronger as she progresses with the PR program.   Expected Outcomes Patient will complete the program meeting both personal and program goals. Patient will complete the program meeting both personal and program goals. Patient will complete the program meeting both personal and program goals. Patient will complete the program meeting both personal and program goals. Patient will complete the program meeting both personal and program goals.            Core Components/Risk Factors/Patient Goals at Discharge (Final Review):   Goals and Risk Factor Review - 08/18/21 1426       Core Components/Risk Factors/Patient Goals Review   Personal Goals Review Improve shortness of breath with ADL's    Review Patient has completed 27 session maintaining her weight. She continues to do well in the program with progression and consistent attendance.  Her O2 sats are averaging  90%-93% while exercising on RA. Her blood pressure is well controlled.  Her personal goals continue to be to decrease her SOB  with ADL's. We will continue to monitor her progress as she works towards meeting these goals and getting stronger as she progresses with the PR program.     Expected Outcomes Patient will complete the program meeting both personal and program goals.             ITP Comments:   Comments: ITP REVIEW Pt is making expected progress toward pulmonary rehab goals after completing 30 sessions. Recommend continued exercise, life style modification, education, and utilization of breathing techniques to increase stamina and strength and decrease shortness of breath with exertion.

## 2021-08-26 ENCOUNTER — Encounter (HOSPITAL_COMMUNITY)
Admission: RE | Admit: 2021-08-26 | Discharge: 2021-08-26 | Disposition: A | Discharge status: Home / Self Care | Payer: Medicare HMO | Source: Ambulatory Visit | Attending: Pulmonary Disease | Admitting: Pulmonary Disease

## 2021-08-26 DIAGNOSIS — J432 Centrilobular emphysema: Secondary | ICD-10-CM

## 2021-08-26 NOTE — Progress Notes (Signed)
Daily Session Note  Patient Details  Name: Brittany Miranda MRN: [id number] Date of Birth: 1950/12/09 Referring Provider:   [place] from 04/22/2021 in [name]  Referring Provider Dr. Elsworth Soho       Encounter Date: 08/26/2021  Check In:  Session Check In - 08/26/21 1330       Check-In   Supervising physician immediately available to respond to emergencies [facility] MD immediately available    Physician(s) Dr. Debara Pickett    Location [facility]    Staff Present Geanie Cooley, RN;Dalton Kris Mouton, MS, ACSM-CEP, Exercise Physiologist    Virtual Visit No    Medication changes reported     No    Fall or balance concerns reported    No    Tobacco Cessation No Change    Warm-up and Cool-down Performed as group-led instruction    Resistance Training Performed Yes    VAD Patient? No    PAD/SET Patient? No      Pain Assessment   Currently in Pain? No/denies    Multiple Pain Sites No             Capillary Blood Glucose: No results found for this or any previous visit (from the past 24 hour(s)).    Social History   Tobacco Use  Smoking Status Former   Packs/day: 1.00   Years: 30.00   Pack years: 30.00   Types: Cigarettes   Quit date: 04/04/1998   Years since quitting: 23.4  Smokeless Tobacco Never    Goals Met:  Proper associated with RPD/PD & O2 Sat Independence with exercise equipment Improved SOB with ADL's Exercise tolerated well No report of concerns or symptoms today Strength training completed today  Goals Unmet:  Not Applicable  Comments: check out @ 2:30pm   Dr. Kathie Dike is Medical Director for [facility].

## 2021-08-31 ENCOUNTER — Encounter (HOSPITAL_COMMUNITY)
Admission: RE | Admit: 2021-08-31 | Discharge: 2021-08-31 | Disposition: A | Discharge status: Home / Self Care | Payer: Medicare HMO | Source: Ambulatory Visit | Attending: Pulmonary Disease | Admitting: Pulmonary Disease

## 2021-08-31 VITALS — Ht 64.0 in | Wt 93.3 lb

## 2021-08-31 DIAGNOSIS — J432 Centrilobular emphysema: Secondary | ICD-10-CM

## 2021-08-31 NOTE — Progress Notes (Signed)
Daily Session Note  Patient Details  Name: Brittany Miranda MRN: [id number] Date of Birth: 08-29-1951 Referring Provider:   Flowsheet Row PULMONARY REHAB OTHER RESP ORIENTATION from 04/22/2021 in [place]  Referring Provider Dr. Elsworth Soho       Encounter Date: 08/31/2021  Check In:  Session Check In - 08/31/21 1330       Check-In   Supervising physician immediately available to respond to emergencies [facility] MD immediately available    Physician(s) Dr. Domenic Polite    Location [facility]    Staff Present Geanie Cooley, RN;Heather Otho Ket, BS, Exercise Physiologist;Dalton Kris Mouton, MS, ACSM-CEP, Exercise Physiologist    Virtual Visit No    Medication changes reported     No    Fall or balance concerns reported    No    Tobacco Cessation No Change    Warm-up and Cool-down Performed as group-led instruction    Resistance Training Performed Yes    VAD Patient? No    PAD/SET Patient? No      Pain Assessment   Currently in Pain? No/denies    Multiple Pain Sites No             Capillary Blood Glucose: No results found for this or any previous visit (from the past 24 hour(s)).    Social History   Tobacco Use  Smoking Status Former   Packs/day: 1.00   Years: 30.00   Pack years: 30.00   Types: Cigarettes   Quit date: 04/04/1998   Years since quitting: 23.4  Smokeless Tobacco Never    Goals Met:  Proper associated with RPD/PD & O2 Sat Independence with exercise equipment Improved SOB with ADL's Exercise tolerated well No report of concerns or symptoms today Strength training completed today  Goals Unmet:  Not Applicable  Comments: check out @ 2:30pm   Dr. Kathie Dike is Medical Director for [facility] Pulmonary Rehab.

## 2021-09-02 ENCOUNTER — Encounter (HOSPITAL_COMMUNITY)
Admission: RE | Admit: 2021-09-02 | Discharge: 2021-09-02 | Disposition: A | Discharge status: Home / Self Care | Payer: Medicare HMO | Source: Ambulatory Visit | Attending: Pulmonary Disease | Admitting: Pulmonary Disease

## 2021-09-02 ENCOUNTER — Other Ambulatory Visit: Payer: Self-pay

## 2021-09-02 DIAGNOSIS — J432 Centrilobular emphysema: Secondary | ICD-10-CM

## 2021-09-02 NOTE — Progress Notes (Signed)
Daily Session Note  Patient Details  Name: Brittany Miranda MRN: [id number] Date of Birth: 02-12-1951 Referring Provider:   Flowsheet Row PULMONARY REHAB OTHER RESP ORIENTATION from 04/22/2021 in [place]  Referring Provider Dr. Elsworth Soho       Encounter Date: 09/02/2021  Check In:  Session Check In - 09/02/21 1330       Check-In   Supervising physician immediately available to respond to emergencies [facility] MD immediately available    Physician(s) Dr. Domenic Polite    Location [facility]    Staff Present Geanie Cooley, RN;Heather Otho Ket, BS, Exercise Physiologist;[place]en [place] Kris Mouton, MS, ACSM-CEP, Exercise Physiologist    Virtual Visit No    Medication changes reported     No    Fall or balance concerns reported    No    Tobacco Cessation No Change    Warm-up and Cool-down Performed as group-led instruction    Resistance Training Performed Yes    VAD Patient? No    PAD/SET Patient? No      Pain Assessment   Currently in Pain? No/denies    Multiple Pain Sites No             Capillary Blood Glucose: No results found for this or any previous visit (from the past 24 hour(s)).    Social History   Tobacco Use  Smoking Status Former   Packs/day: 1.00   Years: 30.00   Pack years: 30.00   Types: Cigarettes   Quit date: 04/04/1998   Years since quitting: 23.4  Smokeless Tobacco Never    Goals Met:  Independence with exercise equipment Exercise tolerated well No report of concerns or symptoms today Strength training completed today  Goals Unmet:  Not Applicable  Comments: checkout time is 1430   Dr. Kathie Dike is Medical Director for [facility] Re[facility].

## 2021-09-07 NOTE — Progress Notes (Signed)
Discharge Progress Report  Patient Details  Name: Brittany Miranda MRN: [id number] Date of Birth: 01-28-1951 Referring Provider:   [place] from 04/22/2021 in [place]  Referring Provider Dr. Elsworth Soho        Number of Visits: 33  Reason for Discharge:  Patient reached a stable level of exercise. Patient independent in their exercise. Patient has met program and personal goals.  Smoking History:  Social History   Tobacco Use  Smoking Status Former   Packs/day: 1.00   Years: 30.00   Pack years: 30.00   Types: Cigarettes   Quit date: 04/04/1998   Years since quitting: 23.4  Smokeless Tobacco Never    Diagnosis:  Centrilobular emphysema ([place])  ADL UCSD:  Pulmonary Assessment Scores     Row Name 04/22/21 0821 09/07/21 1541       ADL UCSD   ADL Phase -- Exit    SOB Score total 19 1      CAT Score   CAT Score 3 1      mMRC Score   mMRC Score 1 0             Initial Exercise Prescription:  Initial Exercise Prescription - 04/22/21 0900       Date of Initial Exercise RX and Referring Provider   Date 04/22/21    Referring Provider Dr. Elsworth Soho    Expected Discharge Date 09/02/21      Treadmill   MPH 1.8    Grade 0    Minutes 17      NuStep   Level 1    SPM 80    Minutes 22      Prescription Details   Frequency (times per week) 2    Duration Progress to 30 minutes of continuous aerobic without signs/symptoms of physical distress      Intensity   THRR 40-80% of Max Heartrate 60-119    Ratings of Perceived Exertion 11-13    Perceived Dyspnea 0-4      Resistance Training   Training Prescription Yes    Weight 2 lbs    Reps 10-15             Discharge Exercise Prescription (Final Exercise Prescription Changes):  Exercise Prescription Changes - 08/24/21 1500       Response to Exercise   Blood Pressure (Admit) 110/60    Blood Pressure (Exercise) 155/100    Blood Pressure (Exit)  100/60    Heart Rate (Admit) 87 bpm    Heart Rate (Exercise) 110 bpm    Heart Rate (Exit) 101 bpm    Oxygen Saturation (Admit) 93 %    Oxygen Saturation (Exercise) 87 %    Oxygen Saturation (Exit) 97 %    Rating of Perceived Exertion (Exercise) 12    Perceived Dyspnea (Exercise) 12    Duration Continue with 30 min of aerobic exercise without signs/symptoms of physical distress.    Intensity THRR unchanged      Progression   Progression Continue to progress workloads to maintain intensity without signs/symptoms of physical distress.      Resistance Training   Training Prescription Yes    Weight 3    Reps 10-15    Time 10 Minutes      Treadmill   MPH 2.7    Grade 0    Minutes 22    METs 3.1      NuStep   Level 3    SPM 93  Minutes 17    METs 2.1             Functional Capacity:  6 Minute Walk     Row Name 04/22/21 0937 08/31/21 1433       6 Minute Walk   Phase Initial Discharge    Distance 1000 feet 1400 feet    Distance Feet Change -- 400 ft    Walk Time 6 minutes 6 minutes    # of Rest Breaks 0 0    MPH 1.89 2.7    METS 2.78 3.72    RPE 9 11    Perceived Dyspnea  9 11    VO2 Peak 9.71 13.05    Symptoms No No    Resting HR 80 bpm 81 bpm    Resting BP 122/84 135/75    Resting Oxygen Saturation  95 % 95 %    Exercise Oxygen Saturation  during 6 min walk 88 % 87 %    Max Ex. HR 97 bpm 105 bpm    Max Ex. BP 126/86 145/75    2 Minute Post BP 118/86 130/70      Interval HR   1 Minute HR 87 99    2 Minute HR 92 101    3 Minute HR 89 100    4 Minute HR 95 103    5 Minute HR 92 105    6 Minute HR 97 104    2 Minute Post HR 82 89    Interval Heart Rate? Yes Yes      Interval Oxygen   Interval Oxygen? Yes Yes    Baseline Oxygen Saturation % 95 % 95 %    1 Minute Oxygen Saturation % 90 % 87 %    1 Minute Liters of Oxygen 0 L 0 L    2 Minute Oxygen Saturation % 91 % 87 %    2 Minute Liters of Oxygen 0 L 0 L    3 Minute Oxygen Saturation % 90 % 87  %    3 Minute Liters of Oxygen 0 L 0 L    4 Minute Oxygen Saturation % 91 % 88 %    4 Minute Liters of Oxygen 0 L 0 L    5 Minute Oxygen Saturation % 89 % 88 %    5 Minute Liters of Oxygen 0 L 0 L    6 Minute Oxygen Saturation % 88 % 89 %    6 Minute Liters of Oxygen 0 L 0 L    2 Minute Post Oxygen Saturation % 91 % 95 %    2 Minute Post Liters of Oxygen 0 L 0 L             Psychological, QOL, Others - Outcomes: PHQ 2/9: Depression screen [facility] 2/9 09/07/2021 04/22/2021  Decreased Interest 0 0  Down, Depressed, Hopeless 0 0  PHQ - 2 Score 0 0  Altered sleeping 0 0  Tired, decreased energy 0 0  Change in appetite 0 0  Feeling bad or failure about yourself  0 0  Trouble concentrating 0 0  Moving slowly or fidgety/restless 0 0  Suicidal thoughts 0 0  PHQ-9 Score 0 0  Difficult doing work/chores Not difficult at all Not difficult at all    Quality of Life:  Quality of Life - 09/02/21 1503       Quality of Life   Select Quality of Life      Quality of Life Scores  Health/Function Pre 20.75 %    Health/Function Post 29.53 %    Health/Function % Change 42.31 %    Socioeconomic Pre 21 %    Socioeconomic Post 30 %    Socioeconomic % Change  42.86 %    Psych/Spiritual Pre 21 %    Psych/Spiritual Post 30 %    Psych/Spiritual % Change 42.86 %    Family Pre 21 %    Family Post 30 %    Family % Change 42.86 %    GLOBAL Pre 20.88 %    GLOBAL Post 29.78 %    GLOBAL % Change 42.62 %             Personal Goals: Goals established at orientation with interventions provided to work toward goal.  Personal Goals and Risk Factors at Admission - 04/22/21 0831       Core Components/Risk Factors/Patient Goals on Admission   Improve shortness of breath with ADL's Yes    Intervention Provide education, individualized exercise plan and daily activity instruction to help decrease symptoms of SOB with activities of daily living.    Expected Outcomes Short Term: Improve  cardiorespiratory fitness to achieve a reduction of symptoms when performing ADLs;Long Term: Be able to perform more ADLs without symptoms or delay the onset of symptoms              Personal Goals Discharge:  Goals and Risk Factor Review     Row Name 04/28/21 0959 05/25/21 1145 06/22/21 0923 07/22/21 1455 08/18/21 1426     Core Components/Risk Factors/Patient Goals Review   Personal Goals Review Improve shortness of breath with ADL's Improve shortness of breath with ADL's Improve shortness of breath with ADL's Improve shortness of breath with ADL's Improve shortness of breath with ADL's   Review Patient was referred to PR by Dr. Elsworth Soho with Centrilobular Emphysema. She plans to start the program 05/04/21. Her personal goals for the program are to decrease her SOB with ADL's. We will continue to monitor her progress as she works towards meeting these goals. Patient is new to program attending 4 sessions. She will complete the prpogram meeting both program and personal goals.She is doing well in the program with progression and consistence attendance.  O2 sats averaging 89%-93% while exercising and no acute respiratory noted.  Encourage breathing techniques such as purse lipped breathing. Her personal goals are to decrease SOB  with ADL's.   We will continue to monitor her progress as she works towards meeting these goals and getting stronger as she progresses with the PR program. Patient has completed 11 session. She will complete the program meeting both program and personal goals.She is doing well in the program with progression and consistence attendance.  O2 sats averaging 90%-93% while exercising and no acute respiratory noted.  Encourage breathing techniques such as purse lipped breathing. Her personal goals are to decrease SOB  with ADL's.   We will continue to monitor her progress as she works towards meeting these goals and getting stronger as she progresses with the PR program. Patient has  completed 21 session maintaining her weight. She continues to do well in the program with progression and consistence attendance.  Her O2 sats are averaging  90%-93% while exercising on RA. Her blood pressure is well controlled.  Her personal goals continue to be to decrease her SOB  with ADL's. We will continue to monitor her progress as she works towards meeting these goals and getting stronger as she progresses with  the PR program. Patient has completed 27 session maintaining her weight. She continues to do well in the program with progression and consistent attendance.  Her O2 sats are averaging  90%-93% while exercising on RA. Her blood pressure is well controlled.  Her personal goals continue to be to decrease her SOB  with ADL's. We will continue to monitor her progress as she works towards meeting these goals and getting stronger as she progresses with the PR program.   Expected Outcomes Patient will complete the program meeting both personal and program goals. Patient will complete the program meeting both personal and program goals. Patient will complete the program meeting both personal and program goals. Patient will complete the program meeting both personal and program goals. Patient will complete the program meeting both personal and program goals.    [place] Name 09/07/21 1548             Core Components/Risk Factors/Patient Goals Review   Personal Goals Review Improve shortness of breath with ADL's       Review Pt graduated after completing 33 sessions. Her weight was stable. Her O2 sats were usually around 90-92% while exercising. They would occasionally drop below 88% but she would increase her sats when prompted to pursed lip breathe. Her SOB was decreased significantly while in the program as her MMRC, CAT, and dyspnea scale were all decreased.       Expected Outcomes Pt will continue to work towards their goals post discharge.                Exercise Goals and Review:  Exercise  Goals     Row Name 04/22/21 0941 05/04/21 1533 06/01/21 1018 06/29/21 1444 07/27/21 1641     Exercise Goals   Increase Physical Activity _0    Intervention Provide advice, education, support and counseling about physical activity/exercise needs.;Develop an individualized exercise prescription for aerobic and resistive training based on initial evaluation findings, risk stratification, comorbidities and participant's personal goals. Provide advice, education, support and counseling about physical activity/exercise needs.;Develop an individualized exercise prescription for aerobic and resistive training based on initial evaluation findings, risk stratification, comorbidities and participant's personal goals. Provide advice, education, support and counseling about physical activity/exercise needs.;Develop an individualized exercise prescription for aerobic and resistive training based on initial evaluation findings, risk stratification, comorbidities and participant's personal goals. Provide advice, education, support and counseling about physical activity/exercise needs.;Develop an individualized exercise prescription for aerobic and resistive training based on initial evaluation findings, risk stratification, comorbidities and participant's personal goals. Provide advice, education, support and counseling about physical activity/exercise needs.;Develop an individualized exercise prescription for aerobic and resistive training based on initial evaluation findings, risk stratification, comorbidities and participant's personal goals.   Expected Outcomes Short Term: Attend rehab on a regular basis to increase amount of physical activity.;Long Term: Add in home exercise to make exercise part of routine and to increase amount of physical activity.;Long Term: Exercising regularly at least 3-5 days a week. Short Term: Attend rehab on a regular basis to increase amount of physical activity.;Long Term:  Add in home exercise to make exercise part of routine and to increase amount of physical activity.;Long Term: Exercising regularly at least 3-5 days a week. Short Term: Attend rehab on a regular basis to increase amount of physical activity.;Long Term: Add in home exercise to make exercise part of routine and to increase amount of physical activity.;Long Term: Exercising regularly at least 3-5 days a week. Short Term: Attend rehab  on a regular basis to increase amount of physical activity.;Long Term: Add in home exercise to make exercise part of routine and to increase amount of physical activity.;Long Term: Exercising regularly at least 3-5 days a week. Short Term: Attend rehab on a regular basis to increase amount of physical activity.;Long Term: Add in home exercise to make exercise part of routine and to increase amount of physical activity.;Long Term: Exercising regularly at least 3-5 days a week.   Increase Strength and Stamina _0    Intervention Provide advice, education, support and counseling about physical activity/exercise needs.;Develop an individualized exercise prescription for aerobic and resistive training based on initial evaluation findings, risk stratification, comorbidities and participant's personal goals. Provide advice, education, support and counseling about physical activity/exercise needs.;Develop an individualized exercise prescription for aerobic and resistive training based on initial evaluation findings, risk stratification, comorbidities and participant's personal goals. Provide advice, education, support and counseling about physical activity/exercise needs.;Develop an individualized exercise prescription for aerobic and resistive training based on initial evaluation findings, risk stratification, comorbidities and participant's personal goals. Provide advice, education, support and counseling about physical activity/exercise needs.;Develop an individualized exercise  prescription for aerobic and resistive training based on initial evaluation findings, risk stratification, comorbidities and participant's personal goals. Provide advice, education, support and counseling about physical activity/exercise needs.;Develop an individualized exercise prescription for aerobic and resistive training based on initial evaluation findings, risk stratification, comorbidities and participant's personal goals.   Expected Outcomes Short Term: Increase workloads from initial exercise prescription for resistance, speed, and METs.;Short Term: Perform resistance training exercises routinely during rehab and add in resistance training at home;Long Term: Improve cardiorespiratory fitness, muscular endurance and strength as measured by increased METs and functional capacity (6MWT) Short Term: Increase workloads from initial exercise prescription for resistance, speed, and METs.;Short Term: Perform resistance training exercises routinely during rehab and add in resistance training at home;Long Term: Improve cardiorespiratory fitness, muscular endurance and strength as measured by increased METs and functional capacity (6MWT) Short Term: Increase workloads from initial exercise prescription for resistance, speed, and METs.;Short Term: Perform resistance training exercises routinely during rehab and add in resistance training at home;Long Term: Improve cardiorespiratory fitness, muscular endurance and strength as measured by increased METs and functional capacity (6MWT) Short Term: Increase workloads from initial exercise prescription for resistance, speed, and METs.;Short Term: Perform resistance training exercises routinely during rehab and add in resistance training at home;Long Term: Improve cardiorespiratory fitness, muscular endurance and strength as measured by increased METs and functional capacity (6MWT) Short Term: Increase workloads from initial exercise prescription for resistance, speed, and  METs.;Short Term: Perform resistance training exercises routinely during rehab and add in resistance training at home;Long Term: Improve cardiorespiratory fitness, muscular endurance and strength as measured by increased METs and functional capacity (6MWT)   Able to understand and use rate of perceived exertion (RPE) scale _1    Intervention Provide education and explanation on how to use RPE scale Provide education and explanation on how to use RPE scale Provide education and explanation on how to use RPE scale Provide education and explanation on how to use RPE scale Provide education and explanation on how to use RPE scale   Expected Outcomes Short Term: Able to use RPE daily in rehab to express subjective intensity level;Long Term:  Able to use RPE to guide intensity level when exercising independently Short Term: Able to use RPE daily in rehab to express subjective intensity level;Long Term:  Able to use RPE  to guide intensity level when exercising independently Short Term: Able to use RPE daily in rehab to express subjective intensity level;Long Term:  Able to use RPE to guide intensity level when exercising independently Short Term: Able to use RPE daily in rehab to express subjective intensity level;Long Term:  Able to use RPE to guide intensity level when exercising independently Short Term: Able to use RPE daily in rehab to express subjective intensity level;Long Term:  Able to use RPE to guide intensity level when exercising independently   Able to understand and use Dyspnea scale _0    Intervention Provide education and explanation on how to use Dyspnea scale Provide education and explanation on how to use Dyspnea scale Provide education and explanation on how to use Dyspnea scale Provide education and explanation on how to use Dyspnea scale Provide education and explanation on how to use Dyspnea scale   Expected Outcomes Short Term: Able to use Dyspnea scale daily  in rehab to express subjective sense of shortness of breath during exertion;Long Term: Able to use Dyspnea scale to guide intensity level when exercising independently Short Term: Able to use Dyspnea scale daily in rehab to express subjective sense of shortness of breath during exertion;Long Term: Able to use Dyspnea scale to guide intensity level when exercising independently Short Term: Able to use Dyspnea scale daily in rehab to express subjective sense of shortness of breath during exertion;Long Term: Able to use Dyspnea scale to guide intensity level when exercising independently Short Term: Able to use Dyspnea scale daily in rehab to express subjective sense of shortness of breath during exertion;Long Term: Able to use Dyspnea scale to guide intensity level when exercising independently Short Term: Able to use Dyspnea scale daily in rehab to express subjective sense of shortness of breath during exertion;Long Term: Able to use Dyspnea scale to guide intensity level when exercising independently   Knowledge and understanding of Target Heart Rate Range (THRR) _1    Intervention Provide education and explanation of THRR including how the numbers were predicted and where they are located for reference Provide education and explanation of THRR including how the numbers were predicted and where they are located for reference Provide education and explanation of THRR including how the numbers were predicted and where they are located for reference Provide education and explanation of THRR including how the numbers were predicted and where they are located for reference Provide education and explanation of THRR including how the numbers were predicted and where they are located for reference   Expected Outcomes Short Term: Able to state/look up THRR;Long Term: Able to use THRR to govern intensity when exercising independently;Short Term: Able to use daily as guideline for intensity in rehab Short  Term: Able to state/look up THRR;Long Term: Able to use THRR to govern intensity when exercising independently;Short Term: Able to use daily as guideline for intensity in rehab Short Term: Able to state/look up THRR;Long Term: Able to use THRR to govern intensity when exercising independently;Short Term: Able to use daily as guideline for intensity in rehab Short Term: Able to state/look up THRR;Long Term: Able to use THRR to govern intensity when exercising independently;Short Term: Able to use daily as guideline for intensity in rehab Short Term: Able to state/look up THRR;Long Term: Able to use THRR to govern intensity when exercising independently;Short Term: Able to use daily as guideline for intensity in rehab   Understanding of Exercise Prescription _2   Intervention Provide education, explanation, and written materials on patient's individual exercise prescription Provide education, explanation, and written materials on patient's individual exercise prescription Provide education, explanation, and written materials on patient's individual exercise prescription Provide education, explanation, and written materials on patient's individual exercise prescription Provide education, explanation, and written materials on patient's individual exercise prescription   Expected Outcomes Short Term: Able to explain program exercise prescription;Long Term: Able to explain home exercise prescription to exercise independently Short Term: Able to explain program exercise prescription;Long Term: Able to explain home exercise prescription to exercise independently Short Term: Able to explain program exercise prescription;Long Term: Able to explain home exercise prescription to exercise independently Short Term: Able to explain program exercise prescription;Long Term: Able to explain home exercise prescription to exercise independently Short Term: Able to explain program exercise prescription;Long Term:  Able to explain home exercise prescription to exercise independently    [place] Name 08/24/21 1508             Exercise Goals   Increase Physical Activity Yes       Intervention Provide advice, education, support and counseling about physical activity/exercise needs.;Develop an individualized exercise prescription for aerobic and resistive training based on initial evaluation findings, risk stratification, comorbidities and participant's personal goals.       Expected Outcomes Short Term: Attend rehab on a regular basis to increase amount of physical activity.;Long Term: Add in home exercise to make exercise part of routine and to increase amount of physical activity.;Long Term: Exercising regularly at least 3-5 days a week.       Increase Strength and Stamina Yes       Intervention Provide advice, education, support and counseling about physical activity/exercise needs.;Develop an individualized exercise prescription for aerobic and resistive training based on initial evaluation findings, risk stratification, comorbidities and participant's personal goals.       Expected Outcomes Short Term: Increase workloads from initial exercise prescription for resistance, speed, and METs.;Short Term: Perform resistance training exercises routinely during rehab and add in resistance training at home;Long Term: Improve cardiorespiratory fitness, muscular endurance and strength as measured by increased METs and functional capacity (6MWT)       Able to understand and use rate of perceived exertion (RPE) scale Yes       Intervention Provide education and explanation on how to use RPE scale       Expected Outcomes Short Term: Able to use RPE daily in rehab to express subjective intensity level;Long Term:  Able to use RPE to guide intensity level when exercising independently       Able to understand and use Dyspnea scale Yes       Intervention Provide education and explanation on how to use Dyspnea scale        Expected Outcomes Short Term: Able to use Dyspnea scale daily in rehab to express subjective sense of shortness of breath during exertion;Long Term: Able to use Dyspnea scale to guide intensity level when exercising independently       Knowledge and understanding of Target Heart Rate Range (THRR) Yes       Intervention Provide education and explanation of THRR including how the numbers were predicted and where they are located for reference       Expected Outcomes Short Term: Able to state/look up THRR;Long Term: Able to use THRR to govern intensity when exercising independently;Short Term: Able to use daily as guideline for intensity in rehab       Understanding of Exercise Prescription Yes  Intervention Provide education, explanation, and written materials on patient's individual exercise prescription       Expected Outcomes Short Term: Able to explain program exercise prescription;Long Term: Able to explain home exercise prescription to exercise independently                Exercise Goals Re-Evaluation:  Exercise Goals Re-Evaluation     Row Name 05/04/21 1533 06/01/21 1018 06/29/21 1444 07/27/21 1641 08/24/21 1510     Exercise Goal Re-Evaluation   Exercise Goals Review Increase Physical Activity;Increase Strength and Stamina;Able to understand and use rate of perceived exertion (RPE) scale;Understanding of Exercise Prescription;Able to check pulse independently;Knowledge and understanding of Target Heart Rate Range (THRR) Increase Physical Activity;Increase Strength and Stamina;Able to understand and use rate of perceived exertion (RPE) scale;Understanding of Exercise Prescription;Able to check pulse independently;Knowledge and understanding of Target Heart Rate Range (THRR) Increase Physical Activity;Increase Strength and Stamina;Able to understand and use rate of perceived exertion (RPE) scale;Understanding of Exercise Prescription;Able to check pulse independently;Knowledge and  understanding of Target Heart Rate Range (THRR) Increase Physical Activity;Increase Strength and Stamina;Able to understand and use rate of perceived exertion (RPE) scale;Understanding of Exercise Prescription;Able to check pulse independently;Knowledge and understanding of Target Heart Rate Range (THRR) Increase Physical Activity;Increase Strength and Stamina;Able to understand and use rate of perceived exertion (RPE) scale;Understanding of Exercise Prescription;Able to check pulse independently;Knowledge and understanding of Target Heart Rate Range (THRR)   Comments Pt has completed 1 exercise session. She is eager to participate in the program. She currently exercises at 2.0 METs on the stepper. Will continue to monitor and progress as able. Pt has completed 6 exercise sessions. She has been inconsistent with her attendance, but has still been able to increase her intensity on the treadmill. Her oxygen sats drop sometimes on the treadmill but recover when she does pursed lip breathing. She is currently exercising at 1.9 METs on the stepper. Will continue to monitor and progress as able. Pt has completed 14 exercise sessions. Her attendance has been consistent over the past month. She has been able to increase her workloads and her SpO2 levels have remained adequate. She is currently exercising at 2.3 METs on the stepper. Will continue to monitor and progress as able. Pt has completed 22 exercise sessions. Her SpO2 levels have remained adequate and she is able to bring it up with pursed lip breathing when cued by staff. She is progressing well and increases her workloads. She is currently exercising at 2.5 METs on the stepper. Will continue to monitor and progress as able. Pt has completed 29 sessions of cardiac rehab. She continues to increase her work load and is progressing well. She is exercising outside of sessions at home. She is currently exercising at 3.1 METs on the TM. Will continue to monitor and  progress as able.   Expected Outcomes Through exercise at home and at rehab the patient will meet their goals. Through exercise at home and at rehab the patient will meet their goals. Through exercise at home and at rehab the patient will meet their goals. Through exercise at home and at rehab the patient will meet their goals. Through exercise at home and at rehab the patient will meet their goals.            Nutrition & Weight - Outcomes:  Pre Biometrics - 04/22/21 7001       Pre Biometrics   Height _0  (1.626 m)    Weight 97 lb (44 kg)  Waist Circumference 29 inches    Hip Circumference 33 inches    Waist to Hip Ratio 0.88 %    BMI (Calculated) 16.64    Triceps Skinfold 5 mm    % Body Fat 22.9 %    Grip Strength 23.4 kg    Flexibility 0 in    Single Leg Stand 12.88 seconds             Post Biometrics - 08/31/21 1437        Post  Biometrics   Height _0  (1.626 m)    Weight 93 lb 4.1 oz (42.3 kg)    Waist Circumference 28.5 inches    Hip Circumference 37 inches    Waist to Hip Ratio 0.77 %    BMI (Calculated) 16    Triceps Skinfold 4 mm    % Body Fat 21.3 %    Grip Strength 23.5 kg    Flexibility 0 in    Single Leg Stand 20 seconds             Nutrition:  Nutrition Therapy & Goals - 06/22/21 0919       Personal Nutrition Goals   Comments Handout provided and discussed regarding healthier choices. Review on how to choose heart healthy meals.      Intervention Plan   Intervention Nutrition handout(s) given to patient.             Nutrition Discharge:  Nutrition Assessments - 09/07/21 1544       MEDFICTS Scores   Pre Score 18    Post Score 15    Score Difference -3             Education Questionnaire Score:  Knowledge Questionnaire Score - 09/07/21 1541       Knowledge Questionnaire Score   Post Score 16/18             Goals reviewed with patient; copy given to patient. Pt graduated from pulmonary rehab after 33  sessions. She had consistent attendance and progressed her workloads frequently. She was able to increase her six minute walk test distance by 40% from orientation to discharge. Her CAT, MMRC, and dyspnea scale were all significantly decreased from orientation to discharge. She is now exercising almost every day by walking over two miles on her own. Her MET level at graduation was 3.2.

## 2021-10-24 NOTE — Progress Notes (Signed)
Primary Care Physician:  Sharilyn Sites, MD Primary Gastroenterologist:  Dr. Gala Romney  Chief Complaint  Patient presents with   Colonoscopy    Due for tcs. Woke up during tcs 10 yrs ago    HPI:   Brittany Miranda is a 70 y.o. female presenting today to schedule surveillance colonoscopy due to history of colonic adenomas. Last colonoscopy in August 2017 with colonic diverticulosis, one 5 mm polyp removed from the splenic flexure.  Recommended repeat colonoscopy in 5 years.  Today:  No GI concerns. Denies abdominal pain, constipation, diarrhea, brbpr, melena, unintentional weight loss. No upper GI problems.   Just finished pulmonary rehab. Breathing is much better. Walks 3 miles quickly daily. Does very well with this. Not requiring oxygen.   BP is quite elevated today at 180/102.  She has a history of hypertension and is asymptomatic.  Denies CP, palpitations, HA, blurry vision.  Notably, we do not have an appropriately sized blood pressure cuff in our office to obtain an accurate blood pressure.  She does currently meet criteria for hypertensive urgency though I am not sure our readings in office are accurate.  She prefers to go to her primary care office to have her blood pressure rechecked before proceeding to the emergency room.  Past Medical History:  Diagnosis Date   Anxiety    Colon polyps    adenomas in remote past    COPD (chronic obstructive pulmonary disease) ([place])    Depression    Osteopenia     Past Surgical History:  Procedure Laterality Date   APPENDECTOMY     COLONOSCOPY  11/2005   Dr. Ala Bent diverticula   COLONOSCOPY  2000   [facility]-->per patient colon polyps, adenomatous   COLONOSCOPY N/A 07/08/2016   Surgeon: Daneil Dolin, MD; colonic diverticulosis, one 5 mm tubular adenoma, repeat in 5 years.   IRRIGATION AND DEBRIDEMENT OF WOUND WITH SPLIT THICKNESS SKIN GRAFT Left 08/13/2018   Procedure: IRRIGATION AND DEBRIDEMENT OF LEFT LEG WOUND WITH SPLIT  THICKNESS SKIN GRAFT;  Surgeon: Cristine Polio, MD;  Location: [place];  Service: Plastics;  Laterality: Left;   SKIN SPLIT GRAFT Left 08/13/2018   Procedure: SKIN GRAFT SPLIT THICKNESS;  Surgeon: Cristine Polio, MD;  Location: [place];  Service: Plastics;  Laterality: Left;    Current Outpatient Medications  Medication Sig Dispense Refill   albuterol (PROVENTIL HFA;VENTOLIN HFA) 108 (90 Base) MCG/ACT inhaler Inhale 1 puff into the lungs every 6 (six) hours as needed.     ALPRAZolam (XANAX) 0.5 MG tablet Take 0.5 mg by mouth 3 (three) times daily as needed.     augmented betamethasone dipropionate (DIPROLENE-AF) 0.05 % cream Apply 1 application topically 2 (two) times daily as needed.     Biotin (BIOTIN 5000) 5 MG CAPS Take 1 capsule by mouth daily.     Calcium Carbonate-Vitamin D (CALTRATE 600+D PO) Take 600 mg by mouth daily.     DULoxetine (CYMBALTA) 60 MG capsule Take 60 mg by mouth daily.     Melatonin 10 MG TABS Take 1 tablet by mouth as needed.     Omega-3 Fatty Acids (FISH OIL) 1000 MG CAPS Take 2,400 mg by mouth daily.     Psyllium (METAMUCIL PO) Take by mouth. 10 grams bid     TRELEGY ELLIPTA 100-62.5-25 MCG/ACT AEPB Inhale 1 puff into the lungs daily.     No current facility-administered medications for this visit.    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 [employer] Use   Smoking status: Former    Packs/day: 1.00    Years: 30.00    Pack years: 30.00    Types:  Cigarettes    Quit date: 04/04/1998    Years since quitting: 23.5   Smokeless tobacco: Never  Vaping Use   Vaping Use: Never used  Substance and Sexual Activity   Alcohol use: Yes    Comment: occ when celebrates   Drug use: No   Sexual activity: Not on file  Other Topics Concern   Not on file  Social History Narrative   Not on file   Social Determinants of Health   Financial Resource Strain: Not on file  Food Insecurity: Not on file  Transportation Needs: Not on file  Physical Activity: Not on file  Stress: Not on file  Social Connections: Not on file  Intimate Partner Violence: Not on file    Review of Systems: Gen: Denies any fever, chills, cold or flulike symptoms, presyncope, syncope. CV: Denies chest pain, heart palpitations. Resp: Denies shortness of breath or cough. GI: See HPI GU : Denies urinary burning, urinary frequency, urinary hesitancy MS: Denies joint pain Derm: Denies rash Psych: Denies depression, anxiety Heme: See HPI  Physical Exam: BP (!) 170/100   Pulse 89   Temp (!) 97.1 F (36.2 C) (Temporal)   Ht 5\' 4"  (1.626 m)   Wt 94 lb 12.8 oz (43 kg)   BMI 16.27 kg/m  General:  Alert and oriented. Pleasant and cooperative. Well-nourished and well-developed.  Head:  Normocephalic and atraumatic. Eyes:  Without icterus, sclera clear and conjunctiva pink.  Ears:  Normal auditory acuity. Lungs:  Clear to auscultation bilaterally. No wheezes, rales, or rhonchi. No distress.  Heart:  S1, S2 present without murmurs appreciated.  Abdomen:  +BS, soft, non-tender and non-distended. No HSM noted. No guarding or rebound. No masses appreciated.  Rectal:  Deferred  Msk:  Symmetrical without gross deformities. Normal posture. Extremities:  Without edema. Neurologic:  Alert and  oriented x4;  grossly normal neurologically. Skin:  Intact without significant lesions or rashes. Psych: Normal mood and affect.    Assessment: 70 year old female with history of  colonic adenomas presenting today to discuss scheduling surveillance colonoscopy.  Her last colonoscopy was in August 2017 with colonic diverticulosis, one 5 mm tubular adenoma removed with recommendations to repeat in 5 years.  Currently without any significant GI symptoms or alarm symptoms.  Of note, her blood pressure is significantly elevated at 180/102 today.  She has no history of hypertension and is asymptomatic.  Unfortunately, we do not have an appropriately sized blood pressure cuff to obtain an accurate reading.  Discussed the fact that she meets criteria for hypertensive urgency and would require an emergency room evaluation.  She prefers to go to her primary care office to have her blood pressure rechecked with a pediatric cuff.  Plan: Proceed to Dr. Delanna Ahmadi office for BP recheck.  Orland Jarred, CMA called Dr. Delanna Ahmadi office who agreed to see her for BP check with the nurse. Requested a call back with her reading.  If BP is greater than or equal to 681 systolic or 661 diastolic, or if she develop symptoms such as headache, chest pain, blurry vision, she needs to proceed to the emergency room. If evidence of hypertension on BP recheck, she needs to see PCP for management prior to scheduling colonoscopy. If she is normotensive, we can proceed with colonoscopy with Dr. Gala Romney.  She will need propofol due to increased sedation during last procedure. ASA 3   Aliene Altes, PA-C [facility] 10/25/2021

## 2021-10-25 ENCOUNTER — Telehealth: Payer: Self-pay

## 2021-10-25 ENCOUNTER — Encounter: Payer: Self-pay | Admitting: Gastroenterology

## 2021-10-25 ENCOUNTER — Other Ambulatory Visit: Payer: Self-pay

## 2021-10-25 ENCOUNTER — Ambulatory Visit: Payer: Medicare HMO | Admitting: Gastroenterology

## 2021-10-25 ENCOUNTER — Emergency Department (HOSPITAL_COMMUNITY)
Admission: EM | Admit: 2021-10-25 | Discharge: 2021-10-25 | Discharge status: Against Medical Advice | Payer: Medicare HMO | Source: Home / Self Care

## 2021-10-25 VITALS — BP 170/100 | HR 89 | Temp 97.1°F | Ht 64.0 in | Wt 94.8 lb

## 2021-10-25 DIAGNOSIS — I10 Essential (primary) hypertension: Secondary | ICD-10-CM | POA: Diagnosis not present

## 2021-10-25 DIAGNOSIS — Z86010 Personal history of colonic polyps: Secondary | ICD-10-CM | POA: Diagnosis not present

## 2021-10-25 NOTE — Patient Instructions (Addendum)
Please proceed to Dr. Delanna Ahmadi office to have your blood pressure rechecked.  Please call me with the blood pressure readings.  If your to blood pressure number is 180 or greater or your bottom number is 120 or greater, you will need to proceed to the emergency room.   If you have headache, chest pain, blurry vision, you need to proceed to the emergency room.  If your blood pressure is not elevated, we should be able to proceed with getting you scheduled for colonoscopy.  Aliene Altes, PA-C [facility]

## 2021-10-25 NOTE — Telephone Encounter (Signed)
Agree with recommendations. After ER evaluation, she will need a follow-up with PCP for BP management ,and we will need to see her back in the office in 3-4 months to discuss scheduling her colonoscopy at that time.   Stacey: Please arrange follow-up to schedule colonoscopy in 3-4 months.

## 2021-10-25 NOTE — Telephone Encounter (Signed)
Pt was made aware and verbalized understanding.  

## 2021-10-25 NOTE — Telephone Encounter (Signed)
Pt went to [facility] to have bp rechecked and it was 180/110. Advised pt to proceed to the emergency room.

## 2021-10-26 ENCOUNTER — Encounter: Payer: Self-pay | Admitting: Internal Medicine

## 2021-10-27 ENCOUNTER — Other Ambulatory Visit: Payer: Self-pay

## 2021-10-27 ENCOUNTER — Ambulatory Visit
Admission: RE | Admit: 2021-10-27 | Discharge: 2021-10-27 | Disposition: A | Discharge status: Home / Self Care | Payer: Medicare HMO | Source: Ambulatory Visit | Attending: Family Medicine | Admitting: Family Medicine

## 2021-10-27 VITALS — BP 165/110 | HR 86 | Temp 97.7°F | Resp 16

## 2021-10-27 DIAGNOSIS — I10 Essential (primary) hypertension: Secondary | ICD-10-CM

## 2021-10-27 DIAGNOSIS — R42 Dizziness and giddiness: Secondary | ICD-10-CM

## 2021-10-27 MED ORDER — CLONIDINE HCL 0.1 MG PO TABS
0.1000 mg | ORAL_TABLET | Freq: Once | ORAL | Status: AC
  Administered 2021-10-27: 12:00:00 0.1 mg via ORAL

## 2021-10-27 MED ORDER — AMLODIPINE BESYLATE 5 MG PO TABS: 5.0000 mg | ORAL_TABLET | Freq: Every day | ORAL | 0 refills | Status: DC

## 2021-10-27 NOTE — Discharge Instructions (Addendum)
Begin taking the blood pressure medicine prescribed today and keep your follow up appt with your primary doctor.

## 2021-10-27 NOTE — ED Triage Notes (Signed)
Pt report blood pressure was 170/100 on 10/25/2021 at the evaluation for colonoscopy.  states she never had high blood pressure. Pt reports she is feeling lightheaded since this morning.   Pt BP:  10/26/21  08:00 159/100 14:00 152/100 16:00 166/113 19:00 160/105 20:30 163/104   10/27/21 09:00 175/104

## 2021-10-27 NOTE — ED Provider Notes (Signed)
[place]   [id number] 10/27/21 Arrival Time: 3810  ASSESSMENT & PLAN:  1. Elevated blood pressure reading in office with diagnosis of hypertension   2. Lightheadedness     Meds ordered this encounter  Medications   cloNIDine (CATAPRES) tablet 0.1 mg   amLODipine (NORVASC) 5 MG tablet    Sig: Take 1 tablet (5 mg total) by mouth daily.    Dispense:  30 tablet    Refill:  0   BP better. Lightheadedness has resolved. No indication for ED evaluation at this time. She has f/u appt with PCP in 48 hours. Begin taking amlodipine qd. Agrees to ED eval should BP rise with symptoms.  Reviewed expectations re: course of current medical issues. Questions answered. Outlined signs and symptoms indicating need for more acute intervention. Patient verbalized understanding. After Visit Summary given.   SUBJECTIVE:  Brittany Miranda is a 70 y.o. female who presents with concerns regarding increased blood pressures. Reports lightheadedness this am. See triage note for recorded blood pressures. Not currently tx for HTN. She reports no chest pain on exertion, no dyspnea on exertion, no swelling of ankles, no orthostatic dizziness or lightheadedness, no orthopnea or paroxysmal nocturnal dyspnea, and no palpitations.  Social History   Tobacco Use  Smoking Status Former   Packs/day: 1.00   Years: 30.00   Pack years: 30.00   Types: Cigarettes   Quit date: 04/04/1998   Years since quitting: 23.5  Smokeless Tobacco Never    OBJECTIVE:  Vitals:   10/27/21 1120 10/27/21 1203  BP: (!) 194/116 (!) 165/110  Pulse: 86   Resp: 16   Temp: 97.7 F (36.5 C)   TempSrc: Oral   SpO2: 98%     General appearance: alert; no distress Eyes: PERRLA; EOMI HENT: normocephalic; atraumatic Neck: supple Lungs: unlabored Heart: regular  Abdomen: soft, non-tender; bowel sounds normal Extremities: no edema; symmetrical with no gross deformities Skin: warm and dry Psychological: alert and  cooperative; normal mood and affect  ECG: Orders placed or performed during the hospital encounter of 08/23/18   EKG 12-Lead   EKG 12-Lead   EKG    Labs: Results for orders placed or performed during the hospital encounter of 04/09/21  Pulmonary function test  Result Value Ref Range   FVC-Pre 2.47 L   FVC-%Pred-Pre 83 %   FVC-Post 2.39 L   FVC-%Pred-Post 80 %   FVC-%Change-Post -3 %   FEV1-Pre 1.10 L   FEV1-%Pred-Pre 49 %   FEV1-Post 1.15 L   FEV1-%Pred-Post 51 %   FEV1-%Change-Post 4 %   FEV6-Pre 2.12 L   FEV6-%Pred-Pre 74 %   FEV6-Post 2.16 L   FEV6-%Pred-Post 76 %   FEV6-%Change-Post 2 %   Pre FEV1/FVC ratio 44 %   FEV1FVC-%Pred-Pre 58 %   Post FEV1/FVC ratio 48 %   FEV1FVC-%Change-Post 8 %   Pre FEV6/FVC Ratio 86 %   FEV6FVC-%Pred-Pre 89 %   Post FEV6/FVC ratio 90 %   FEV6FVC-%Pred-Post 94 %   FEV6FVC-%Change-Post 5 %   FEF 25-75 Pre 0.34 L/sec   FEF2575-%Pred-Pre 18 %   FEF 25-75 Post 0.37 L/sec   FEF2575-%Pred-Post 19 %   FEF2575-%Change-Post 8 %   RV 2.33 L   RV % pred 105 %   TLC 4.86 L   TLC % pred 96 %   DLCO unc 5.83 ml/min/mmHg   DLCO unc % pred 30 %   DL/VA 1.62 ml/min/mmHg/L   DL/VA % pred 39 %   Labs  Reviewed - No data to display  Imaging: No results found.  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   Smoking status: Former    Packs/day: 1.00    Years: 30.00    Pack years: 30.00    Types: Cigarettes    Quit date: 04/04/1998    Years since quitting: 23.5    Smokeless tobacco: Never  Vaping Use   Vaping Use: Never used  Substance and Sexual Activity   Alcohol use: Yes    Comment: occ when celebrates   Drug use: No   Sexual activity: Not on file  Other Topics Concern   Not on file  Social History Narrative   Not on file   Social Determinants of Health   Financial Resource Strain: Not on file  Food Insecurity: Not on file  Transportation Needs: Not on file  Physical Activity: Not on file  Stress: Not on file  Social Connections: Not on file  Intimate Partner Violence: Not on file   Family History  Problem Relation Age of Onset   Kidney disease Mother    Kidney disease Father    Liver disease Neg Hx    Colon cancer Neg Hx    Past Surgical History:  Procedure Laterality Date   APPENDECTOMY     COLONOSCOPY  11/2005   Dr. Ala Bent diverticula   COLONOSCOPY  2000   [facility]-->per patient colon polyps, adenomatous   COLONOSCOPY N/A 07/08/2016   Surgeon: Daneil Dolin, MD; colonic diverticulosis, one 5 mm tubular adenoma, repeat in 5 years.   IRRIGATION AND DEBRIDEMENT OF WOUND WITH SPLIT THICKNESS SKIN GRAFT Left 08/13/2018   Procedure: IRRIGATION AND DEBRIDEMENT OF LEFT LEG WOUND WITH SPLIT THICKNESS SKIN GRAFT;  Surgeon: Cristine Polio, MD;  Location: [place];  Service: Plastics;  Laterality: Left;   SKIN SPLIT GRAFT Left 08/13/2018   Procedure: SKIN GRAFT SPLIT THICKNESS;  Surgeon: Cristine Polio, MD;  Location: [place];  Service: Plastics;  Laterality: Left;       Vanessa Kick, MD 10/27/21 1228

## 2021-11-17 ENCOUNTER — Ambulatory Visit: Payer: Self-pay

## 2021-11-24 ENCOUNTER — Ambulatory Visit
Admission: RE | Admit: 2021-11-24 | Discharge: 2021-11-24 | Disposition: A | Discharge status: Home / Self Care | Payer: Medicare HMO | Source: Ambulatory Visit | Attending: Urgent Care | Admitting: Urgent Care

## 2021-11-24 ENCOUNTER — Ambulatory Visit (INDEPENDENT_AMBULATORY_CARE_PROVIDER_SITE_OTHER): Payer: Medicare HMO

## 2021-11-24 ENCOUNTER — Other Ambulatory Visit: Payer: Self-pay

## 2021-11-24 VITALS — BP 145/90 | HR 86 | Temp 98.2°F | Resp 16

## 2021-11-24 DIAGNOSIS — R0781 Pleurodynia: Secondary | ICD-10-CM

## 2021-11-24 DIAGNOSIS — S2232XA Fracture of one rib, left side, initial encounter for closed fracture: Secondary | ICD-10-CM

## 2021-11-24 MED ORDER — METHOCARBAMOL 500 MG PO TABS
500.0000 mg | ORAL_TABLET | Freq: Two times a day (BID) | ORAL | 0 refills | Status: DC
Start: 2021-11-24 — End: 2022-02-22

## 2021-11-24 MED ORDER — ACETAMINOPHEN 325 MG PO TABS: 650.0000 mg | ORAL_TABLET | Freq: Four times a day (QID) | ORAL | 0 refills | Status: DC | PRN

## 2021-11-24 MED ORDER — IBUPROFEN 400 MG PO TABS
400.0000 mg | ORAL_TABLET | Freq: Three times a day (TID) | ORAL | 0 refills | Status: DC | PRN
Start: 2021-11-24 — End: 2022-08-31

## 2021-11-24 NOTE — ED Triage Notes (Signed)
Patient states that 2 nights ago she got up to use the restroom and fell on the shower bar on her left side  Patient states she has been taking Ibuprofen  She states that when she breaths she feels a pop in her back left rib area.

## 2021-11-24 NOTE — ED Provider Notes (Signed)
[name]   MRN: [id number] DOB: [birth year]  Subjective:   Brittany Miranda is a 71 y.o. female presenting for 2-day history of acute onset persistent left-sided lower lateral chest wall/rib pain.  Patient accidentally hit herself against the shower bar by falling into it.  She has been using ibuprofen but wanted to make sure she got checked for rib fracture.  Denies any difficulty with hemoptysis, bruising, swelling.  She does have a history of chronic respiratory failure, hypoxia and COPD.  However she did complete pulmonary rehab and reports that she feels better with this.  No history of heart disease, MI.  No history of kidney disease.  No current facility-administered medications for this encounter.  Current Outpatient Medications:    albuterol (PROVENTIL HFA;VENTOLIN HFA) 108 (90 Base) MCG/ACT inhaler, Inhale 1 puff into the lungs every 6 (six) hours as needed., Disp: , Rfl:    ALPRAZolam (XANAX) 0.5 MG tablet, Take 0.5 mg by mouth 3 (three) times daily as needed., Disp: , Rfl:    amLODipine (NORVASC) 5 MG tablet, Take 1 tablet (5 mg total) by mouth daily., Disp: 30 tablet, Rfl: 0   augmented betamethasone dipropionate (DIPROLENE-AF) 0.05 % cream, Apply 1 application topically 2 (two) times daily as needed., Disp: , Rfl:    Biotin (BIOTIN 5000) 5 MG CAPS, Take 1 capsule by mouth daily., Disp: , Rfl:    Calcium Carbonate-Vitamin D (CALTRATE 600+D PO), Take 600 mg by mouth daily., Disp: , Rfl:    DULoxetine (CYMBALTA) 60 MG capsule, Take 60 mg by mouth daily., Disp: , Rfl:    Melatonin 10 MG TABS, Take 1 tablet by mouth as needed., Disp: , Rfl:    Omega-3 Fatty Acids (FISH OIL) 1000 MG CAPS, Take 2,400 mg by mouth daily., Disp: , Rfl:    Psyllium (METAMUCIL PO), Take by mouth. 10 grams bid, Disp: , Rfl:    TRELEGY ELLIPTA 100-62.5-25 MCG/ACT AEPB, Inhale 1 puff into the lungs daily., Disp: , Rfl:    No Known Allergies  Past Medical History:  Diagnosis Date   Anxiety     Colon polyps    adenomas in remote past    COPD (chronic obstructive pulmonary disease) ([place])    Depression    Osteopenia      Past Surgical History:  Procedure Laterality Date   APPENDECTOMY     COLONOSCOPY  11/2005   Dr. Ala Bent diverticula   COLONOSCOPY  2000   [facility]-->per patient colon polyps, adenomatous   COLONOSCOPY N/A 07/08/2016   Surgeon: Daneil Dolin, MD; colonic diverticulosis, one 5 mm tubular adenoma, repeat in 5 years.   IRRIGATION AND DEBRIDEMENT OF WOUND WITH SPLIT THICKNESS SKIN GRAFT Left 08/13/2018   Procedure: IRRIGATION AND DEBRIDEMENT OF LEFT LEG WOUND WITH SPLIT THICKNESS SKIN GRAFT;  Surgeon: Cristine Polio, MD;  Location: [place];  Service: Plastics;  Laterality: Left;   SKIN SPLIT GRAFT Left 08/13/2018   Procedure: SKIN GRAFT SPLIT THICKNESS;  Surgeon: Cristine Polio, MD;  Location: [place];  Service: Plastics;  Laterality: Left;    Family History  Problem Relation Age of Onset   Kidney disease Mother    Kidney disease Father    Liver disease Neg Hx    Colon cancer Neg Hx     Social History   Tobacco Use   Smoking status: Former    Packs/day: 1.00    Years: 30.00    Pack years: 30.00    Types: Cigarettes  Quit date: 04/04/1998    Years since quitting: 23.6   Smokeless tobacco: Never  Vaping Use   Vaping Use: Never used  Substance Use Topics   Alcohol use: Yes    Comment: occ when celebrates   Drug use: No    ROS   Objective:   Vitals: BP (!) 145/90 (BP Location: Right Arm)    Pulse 86    Temp 98.2 F (36.8 C) (Oral)    Resp 16    SpO2 96%   Physical Exam Constitutional:      General: She is not in acute distress.    Appearance: Normal appearance. She is well-developed. She is not ill-appearing, toxic-appearing or diaphoretic.  HENT:     Head: Normocephalic and atraumatic.     Nose: Nose normal.     Mouth/Throat:     Mouth: Mucous membranes are moist.  Eyes:      Extraocular Movements: Extraocular movements intact.     Conjunctiva/sclera: Conjunctivae normal.  Cardiovascular:     Rate and Rhythm: Normal rate and regular rhythm.     Pulses: Normal pulses.     Heart sounds: Normal heart sounds. No murmur heard.   No friction rub. No gallop.  Pulmonary:     Effort: Pulmonary effort is normal. No respiratory distress.     Breath sounds: Normal breath sounds. No stridor. No wheezing, rhonchi or rales.  Chest:     Chest wall: Tenderness (Lateral lower ribs) present.  Skin:    General: Skin is warm and dry.     Findings: No rash.  Neurological:     Mental Status: She is alert and oriented to person, place, and time.  Psychiatric:        Mood and Affect: Mood normal.        Behavior: Behavior normal.        Thought Content: Thought content normal.    DG Ribs Unilateral W/Chest Left  Result Date: 11/24/2021 CLINICAL DATA:  Left rib pain EXAM: LEFT RIBS AND CHEST - 3+ VIEW COMPARISON:  03/31/2021 FINDINGS: There is a nondisplaced left anterior tenth rib fracture. Unchanged cardiomediastinal silhouette. Bibasilar pleural thickening and atelectasis. Scattered lung scarring. Emphysema. IMPRESSION: Nondisplaced left anterior tenth rib fracture. Emphysema.  Bibasilar atelectasis and scarring. Electronically Signed   By: Maurine Simmering M.D.   On: 11/24/2021 10:31    Assessment and Plan :   PDMP not reviewed this encounter.  1. Rib pain on left side   2. Closed fracture of one rib of left side, initial encounter    Recommended continued use of ibuprofen, and Tylenol and Robaxin.  She does have a nondisplaced closed rib fracture of the left anterior 10th rib.  I discussed this with her and recommended follow-up with her PCP over the next 1 to 2 weeks. Counseled patient on potential for adverse effects with medications prescribed/recommended today, ER and return-to-clinic precautions discussed, patient verbalized understanding.    Jaynee Eagles, PA-C 11/24/21  1051

## 2021-12-21 ENCOUNTER — Ambulatory Visit: Payer: Medicare HMO | Admitting: Pulmonary Disease

## 2022-01-24 ENCOUNTER — Other Ambulatory Visit: Payer: Self-pay | Admitting: *Deleted

## 2022-01-24 ENCOUNTER — Ambulatory Visit
Admission: RE | Admit: 2022-01-24 | Discharge: 2022-01-24 | Disposition: A | Discharge status: Home / Self Care | Payer: Medicare HMO | Source: Ambulatory Visit | Attending: *Deleted | Admitting: *Deleted

## 2022-01-24 DIAGNOSIS — R7611 Nonspecific reaction to tuberculin skin test without active tuberculosis: Secondary | ICD-10-CM

## 2022-02-08 ENCOUNTER — Encounter: Payer: Self-pay | Admitting: *Deleted

## 2022-02-22 ENCOUNTER — Ambulatory Visit: Payer: Medicare HMO | Admitting: Pulmonary Disease

## 2022-02-22 ENCOUNTER — Encounter: Payer: Self-pay | Admitting: Pulmonary Disease

## 2022-02-22 DIAGNOSIS — J449 Chronic obstructive pulmonary disease, unspecified: Secondary | ICD-10-CM | POA: Diagnosis not present

## 2022-02-22 NOTE — Patient Instructions (Signed)
?  Continue on trelegy  ? ?COngratulations ! ?

## 2022-02-22 NOTE — Progress Notes (Signed)
? ?  Subjective:  ? ? Patient ID: Brittany Miranda, female    DOB: 1951/01/10, 71 y.o.   MRN: [id number] ? ?HPI ? 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 ?Initial office visit 01/2019 , started on Trelegy. ?She needed nocturnal oxygen transiently after hospital visit for an exacerbation  ? ?Chief Complaint  ?Patient presents with  ? Follow-up  ?  Feels breathing has improved   ? ?Has done well on Trelegy.  She completed pulmonary rehab and has kept up with walking, she put in 9000 steps today! ?She completed alcohol rehab program and has been sober for a month.  She has weaned herself off Xanax ? ?Significant tests/ events reviewed ? ?03/2021 >On ambulation heart rate increased from 80-1 03 and oxygen saturation dropped from 96 to 91% on 2 laps ?  ?PFTs 03/2021 ratio 44, FEV1 49%, DLCO 30%, severe airway obstruction ? ?Review of Systems ?Patient denies significant dyspnea,cough, hemoptysis,  chest pain, palpitations, pedal edema, orthopnea, paroxysmal nocturnal dyspnea, lightheadedness, nausea, vomiting, abdominal or  leg pains  ? ?   ?Objective:  ? Physical Exam ? ?Gen. Pleasant, thin, in no distress ?ENT - no thrush, no pallor/icterus,no post nasal drip ?Neck: No JVD, no thyromegaly, no carotid bruits ?Lungs: no use of accessory muscles, no dullness to percussion, clear without rales or rhonchi  ?Cardiovascular: Rhythm regular, heart sounds  normal, no murmurs or gallops, no peripheral edema ?Musculoskeletal: No deformities, no cyanosis or clubbing  ? ? ? ?   ?Assessment & Plan:  ? ?Chronic respiratory failure with hypoxia -resolved ?Vaccinations up-to-date ?

## 2022-02-22 NOTE — Assessment & Plan Note (Signed)
Continue Trelegy, refills will be provided. ?She seldom needs albuterol ?We discussed COPD action plan and she will call us if needed for antibiotic/prednisone ?

## 2022-02-28 NOTE — Progress Notes (Signed)
? ? ?Referring Provider: Sharilyn Sites, MD ?Primary Care Physician:  Jake Samples, PA-C ?Primary GI Physician: Dr. Gala Romney ? ?Chief Complaint  ?Patient presents with  ? Colonoscopy  ?  Constipation   ? ? ?HPI:   ?Brittany Miranda is a 71 y.o. female presenting today to discuss scheduling surveillance colonoscopy due to history of colonic adenomas. Last colonoscopy in August 2017 with colonic diverticulosis, one 5 mm polyp removed from the splenic flexure.  Recommended repeat colonoscopy in 5 years. ? ?Last seen in our office 10/25/2021.  She had no concerning GI symptoms.  She had just finished pulmonary rehab and reported her breathing was much better.  She was walking 3 miles a day and was not requiring oxygen.  Her BP was elevated at 180/102. She has no hx of HTN and was asymptomatic. Notably, we did not have an appropriate sized blood pressure cuff to obtain an accurate blood pressure.  Patient preferred to go to her PCPs office to have blood pressure rechecked before proceeding to the emergency room. ? ?Patient went to [facility] and had blood pressure rechecked.  It was 180/110.  She was advised to proceed to the emergency room. ? ?She presented to the ED on 12/12, but left the waiting room before triage.  She was again seen in the emergency room on 12/12 for elevated BP and lightheadedness.  Her blood pressure initially was 194/116.  Recheck was 165/110 after she was given clonidine.  She was discharged on amlodipine 5 mg daily and advised to follow-up with PCP. ? ?Today:  ?Having some trouble with constipation since going to alcohol rehab on March 7.  Aside from now being abstinent from alcohol, she reports she was also started on naltrexone, trazodone, and taken off of Xanax.  She is currently taking 2-3 stool softeners daily and having incomplete bowel movements every 3 days.  Denies BRBPR or melena.  She is passing a lot of gas.  Denies abdominal pain.  Her weight has been stable.  Appetite is  good. ? ?No upper GI symptoms such as nausea, vomiting, reflux symptoms, or dysphagia. ? ?Hypertension under much better control on losartan.  ? ?In general, patient states she feels like a new person since going to rehab.  She also continues with intensive outpatient therapy 3-4 times a week and loves it. ? ?Past Medical History:  ?Diagnosis Date  ? Anxiety   ? Colon polyps   ? adenomas in remote past   ? COPD (chronic obstructive pulmonary disease) ([place])   ? Depression   ? HTN (hypertension)   ? Osteopenia   ? ? ?Past Surgical History:  ?Procedure Laterality Date  ? APPENDECTOMY    ? COLONOSCOPY  11/2005  ? Dr. Ala Bent diverticula  ? COLONOSCOPY  2000  ? [facility]-->per patient colon polyps, adenomatous  ? COLONOSCOPY N/A 07/08/2016  ? Surgeon: Daneil Dolin, MD; colonic diverticulosis, one 5 mm tubular adenoma, repeat in 5 years.  ? IRRIGATION AND DEBRIDEMENT OF WOUND WITH SPLIT THICKNESS SKIN GRAFT Left 08/13/2018  ? Procedure: IRRIGATION AND DEBRIDEMENT OF LEFT LEG WOUND WITH SPLIT THICKNESS SKIN GRAFT;  Surgeon: Cristine Polio, MD;  Location: [place];  Service: Plastics;  Laterality: Left;  ? SKIN SPLIT GRAFT Left 08/13/2018  ? Procedure: SKIN GRAFT SPLIT THICKNESS;  Surgeon: Cristine Polio, MD;  Location: [place];  Service: Plastics;  Laterality: Left;  ? ? ?Current Outpatient Medications  ?Medication Sig Dispense Refill  ? albuterol (PROVENTIL HFA;VENTOLIN  HFA) 108 (90 Base) MCG/ACT inhaler Inhale 1 puff into the lungs every 6 (six) hours as needed.    ? augmented betamethasone dipropionate (DIPROLENE-AF) 0.05 % cream Apply 1 application topically 2 (two) times daily as needed.    ? Biotin 5 MG CAPS Take 1 capsule by mouth daily.    ? Calcium Carbonate-Vitamin D (CALTRATE 600+D PO) Take 600 mg by mouth daily.    ? DULoxetine (CYMBALTA) 60 MG capsule Take 60 mg by mouth daily.    ? ibuprofen (ADVIL) 400 MG tablet Take 1 tablet (400 mg total) by mouth every 8  (eight) hours as needed. 30 tablet 0  ? losartan (COZAAR) 50 MG tablet Take 50 mg by mouth every morning.    ? naltrexone (DEPADE) 50 MG tablet Take 50 mg by mouth at bedtime.    ? Omega-3 Fatty Acids (FISH OIL) 1000 MG CAPS Take 2,400 mg by mouth daily.    ? traZODone (DESYREL) 50 MG tablet Take 50 mg by mouth at bedtime as needed.    ? TRELEGY ELLIPTA 100-62.5-25 MCG/ACT AEPB Inhale 1 puff into the lungs daily.    ? ?No current facility-administered medications for this visit.  ? ? ?Allergies as of 03/02/2022  ? (No Known Allergies)  ? ? ?Family History  ?Problem Relation Age of Onset  ? Kidney disease Mother   ? Kidney disease Father   ? Liver disease Neg Hx   ? Colon cancer Neg Hx   ? ? ?Social History  ? ?Socioeconomic History  ? Marital status: Divorced  ?  Spouse name: Not on file  ? Number of children: 1  ? Years of education: Not on file  ? Highest education level: Not on file  ?Occupational History  ? Occupation: retired  ?  Employer: RETIRED  ?  Comment: volunteer Loss adjuster, chartered) at [employer]  ?Tobacco Use  ? Smoking status: Former  ?  Packs/day: 1.00  ?  Years: 30.00  ?  Pack years: 30.00  ?  Types: Cigarettes  ?  Quit date: 04/04/1998  ?  Years since quitting: 23.9  ? Smokeless tobacco: Never  ?Vaping Use  ? Vaping Use: Never used  ?Substance and Sexual Activity  ? Alcohol use: Not Currently  ?  Comment: Abstinent since March 2023.  Reports she was drinking "too much" and went to rehab.  ? Drug use: No  ? Sexual activity: Not Currently  ?Other Topics Concern  ? Not on file  ?Social History Narrative  ? Not on file  ? ?Social Determinants of Health  ? ?Financial Resource Strain: Not on file  ?Food Insecurity: Not on file  ?Transportation Needs: Not on file  ?Physical Activity: Not on file  ?Stress: Not on file  ?Social Connections: Not on file  ? ? ?Review of Systems: ?Gen: Denies fever, chills, cold or flulike symptoms, presyncope, syncope. ?CV: Denies chest pain, palpitations. ?Resp: Denies dyspnea or  cough. ?GI: See HPI ?Heme: See HPI ? ?Physical Exam: ?BP 126/72   Pulse 82   Temp 97.6 ?F (36.4 ?C) (Temporal)   Ht '5\' 2"'$  (1.575 m)   Wt 93 lb 12.8 oz (42.5 kg)   BMI 17.16 kg/m?  ?General:   Alert and oriented. No distress noted. Pleasant and cooperative.  ?Head:  Normocephalic and atraumatic. ?Eyes:  Conjuctiva clear without scleral icterus. ?Heart:  S1, S2 present without murmurs appreciated. ?Lungs:  Clear to auscultation bilaterally. No wheezes, rales, or rhonchi. No distress.  ?Abdomen:  +BS, soft, non-tender  and non-distended. No rebound or guarding. No HSM or masses noted. ?Msk:  Symmetrical without gross deformities. Normal posture. ?Extremities:  Without edema. ?Neurologic:  Alert and  oriented x4 ?Psych:  Normal mood and affect. ? ? ? ?Assessment:  ?71 year old female with history of COPD, anxiety/depression, alcohol abuse now abstinent following recent completion of rehab, HTN, adenomatous colon polyps, presenting today to discuss scheduling colonoscopy.  Also discussed constipation. ? ?History of adenomatous colon polyps: ?Overdue for surveillance.  Last colonoscopy in August 2017 with colonic diverticulosis, 5 mm tubular adenoma.  Recommended repeat in 5 years.  She has new onset constipation discussed below.  Denies BRBPR, melena, unintentional weight loss, or any other significant GI symptoms.  We will hold off on scheduling colonoscopy for now until we get her bowels moving well.   ? ?Constipation: ?New onset constipation after going to alcohol rehab in March 2023.  Aside from now being abstinent from alcohol, she was also started on trazodone and naltrexone, both of which can cause constipation.  Currently taking stool softeners which are not working adequately.  No alarm symptoms.  We will try her on Linzess 145 mcg daily.  Samples provided. ? ?HTN:  ?Newly diagnosed in December 2022 with BP 180/102 in our office.  She has since been started on losartan and blood pressure is now under much  better control.   ? ? ?Plan:  ?Start Linzess 145 mcg daily.  Samples provided.  Request progress report in 1 week. ?Holding off on scheduling colonoscopy until constipation is adequately managed.  If Linzess works well and

## 2022-03-02 ENCOUNTER — Ambulatory Visit: Payer: Medicare HMO | Admitting: Gastroenterology

## 2022-03-02 ENCOUNTER — Encounter: Payer: Self-pay | Admitting: Gastroenterology

## 2022-03-02 VITALS — BP 126/72 | HR 82 | Temp 97.6°F | Ht 62.0 in | Wt 93.8 lb

## 2022-03-02 DIAGNOSIS — K5900 Constipation, unspecified: Secondary | ICD-10-CM | POA: Diagnosis not present

## 2022-03-02 DIAGNOSIS — Z86010 Personal history of colonic polyps: Secondary | ICD-10-CM | POA: Diagnosis not present

## 2022-03-02 NOTE — Patient Instructions (Signed)
For constipation, start Linzess 145 mcg daily at least 30 minutes before breakfast.  We are providing you with samples today.  Please call with a progress report in 1 week and let me know how this is working for you.  If it works well, I will send in a prescription. ? ?We will hold off on scheduling a colonoscopy for now until we get your constipation under good control.  If Linzess works well, we will go ahead and get the colonoscopy scheduled in the near future. ? ?I will plan to see back in the office in about 3 months.  Do not hesitate to call sooner if you have any questions or concerns. ? ?It was very good to see you again! ? ?Aliene Altes, PA-C ?[facility] ? ?

## 2022-03-07 ENCOUNTER — Telehealth: Payer: Self-pay | Admitting: *Deleted

## 2022-03-07 ENCOUNTER — Other Ambulatory Visit: Payer: Self-pay | Admitting: Gastroenterology

## 2022-03-07 DIAGNOSIS — K5904 Chronic idiopathic constipation: Secondary | ICD-10-CM

## 2022-03-07 MED ORDER — LINACLOTIDE 145 MCG PO CAPS: 145.0000 ug | ORAL_CAPSULE | Freq: Every day | ORAL | 5 refills | Status: DC

## 2022-03-07 NOTE — Telephone Encounter (Signed)
Rx sent 

## 2022-03-07 NOTE — Telephone Encounter (Signed)
Patient called in. Brittany Miranda is working and asking for rx to be sent into [facility] ?

## 2022-04-08 ENCOUNTER — Other Ambulatory Visit (HOSPITAL_COMMUNITY): Payer: Self-pay | Admitting: Family Medicine

## 2022-04-08 DIAGNOSIS — Z1231 Encounter for screening mammogram for malignant neoplasm of breast: Secondary | ICD-10-CM

## 2022-04-15 ENCOUNTER — Ambulatory Visit (HOSPITAL_COMMUNITY)
Admission: RE | Admit: 2022-04-15 | Discharge: 2022-04-15 | Disposition: A | Discharge status: Home / Self Care | Payer: Medicare HMO | Source: Ambulatory Visit | Attending: Family Medicine | Admitting: Family Medicine

## 2022-04-15 DIAGNOSIS — Z1231 Encounter for screening mammogram for malignant neoplasm of breast: Secondary | ICD-10-CM | POA: Insufficient documentation

## 2022-05-03 ENCOUNTER — Telehealth: Payer: Self-pay | Admitting: Adult Health

## 2022-05-03 ENCOUNTER — Telehealth: Payer: Self-pay | Admitting: Pulmonary Disease

## 2022-05-03 MED ORDER — PREDNISONE 10 MG PO TABS: ORAL_TABLET | ORAL | 0 refills | Status: AC

## 2022-05-03 MED ORDER — DOXYCYCLINE HYCLATE 100 MG PO TABS: 100.0000 mg | ORAL_TABLET | Freq: Two times a day (BID) | ORAL | 0 refills | Status: DC

## 2022-05-03 NOTE — Telephone Encounter (Signed)
Called and spoke with patient, advised of recommendations per Dr. Chase Caller.  Verified pharmacy and scripts sent.  Nothing further needed.

## 2022-05-03 NOTE — Telephone Encounter (Signed)
Called patient but she did not answer. Left message for her to call us back.  

## 2022-05-03 NOTE — Telephone Encounter (Signed)
Error

## 2022-05-03 NOTE — Telephone Encounter (Signed)
Gold stage 3 copd with aecopd  Plan  - Take doxycycline '100mg'$  po twice daily x 5 days; take after meals and avoid sunlight - Please take prednisone 40 mg x1 day, then 30 mg x1 day, then 20 mg x1 day, then 10 mg x1 day, and then 5 mg x1 day and stop - go to ER if worse - also, she should consider a study called ARNASA - [employer] is developing a new biologic injection given every 2 weeks to prevent flareups.  If she is interested we will contact her in the next few months.       Latest Ref Rng & Units 04/09/2021   10:44 AM  PFT Results  FVC-Pre L 2.47   FVC-Predicted Pre % 83   FVC-Post L 2.39   FVC-Predicted Post % 80   Pre FEV1/FVC % % 44   Post FEV1/FCV % % 48   FEV1-Pre L 1.10   FEV1-Predicted Pre % 49   FEV1-Post L 1.15   DLCO uncorrected ml/min/mmHg 5.83   DLCO UNC% % 30   DLVA Predicted % 39   TLC L 4.86   TLC % Predicted % 96   RV % Predicted % 105       has a past medical history of Anxiety, Colon polyps, COPD (chronic obstructive pulmonary disease) ([place]), Depression, HTN (hypertension), and Osteopenia.   reports that she quit smoking about 24 years ago. Her smoking use included cigarettes. She has a 30.00 pack-year smoking history. She has never used smokeless tobacco.  Past Surgical History:  Procedure Laterality Date   APPENDECTOMY     COLONOSCOPY  11/2005   Dr. Ala Bent diverticula   COLONOSCOPY  2000   [facility]-->per patient colon polyps, adenomatous   COLONOSCOPY N/A 07/08/2016   Surgeon: Daneil Dolin, MD; colonic diverticulosis, one 5 mm tubular adenoma, repeat in 5 years.   IRRIGATION AND DEBRIDEMENT OF WOUND WITH SPLIT THICKNESS SKIN GRAFT Left 08/13/2018   Procedure: IRRIGATION AND DEBRIDEMENT OF LEFT LEG WOUND WITH SPLIT THICKNESS SKIN GRAFT;  Surgeon: Cristine Polio, MD;  Location: [place];  Service: Plastics;  Laterality: Left;   SKIN SPLIT GRAFT Left 08/13/2018   Procedure: SKIN GRAFT SPLIT THICKNESS;  Surgeon:  Cristine Polio, MD;  Location: [place];  Service: Plastics;  Laterality: Left;    No Known Allergies  Immunization History  Administered Date(s) Administered   Fluad Quad(high Dose 65+) 08/31/2020   Influenza, High Dose Seasonal PF 08/26/2018   Moderna SARS-COV2 Booster Vaccination 12/01/2020, 01/29/2021   Moderna Sars-Covid-2 Vaccination 05/01/2020, 05/31/2020   Tdap 06/06/2018   Zoster Recombinat (Shingrix) 06/20/2018, 11/19/2018    Family History  Problem Relation Age of Onset   Kidney disease Mother    Kidney disease Father    Liver disease Neg Hx    Colon cancer Neg Hx      Current Outpatient Medications:    albuterol (PROVENTIL HFA;VENTOLIN HFA) 108 (90 Base) MCG/ACT inhaler, Inhale 1 puff into the lungs every 6 (six) hours as needed., Disp: , Rfl:    augmented betamethasone dipropionate (DIPROLENE-AF) 0.05 % cream, Apply 1 application topically 2 (two) times daily as needed., Disp: , Rfl:    Biotin 5 MG CAPS, Take 1 capsule by mouth daily., Disp: , Rfl:    Calcium Carbonate-Vitamin D (CALTRATE 600+D PO), Take 600 mg by mouth daily., Disp: , Rfl:    DULoxetine (CYMBALTA) 60 MG capsule, Take 60 mg by mouth daily., Disp: , Rfl:  ibuprofen (ADVIL) 400 MG tablet, Take 1 tablet (400 mg total) by mouth every 8 (eight) hours as needed., Disp: 30 tablet, Rfl: 0   linaclotide (LINZESS) 145 MCG CAPS capsule, Take 1 capsule (145 mcg total) by mouth daily before breakfast., Disp: 30 capsule, Rfl: 5   losartan (COZAAR) 50 MG tablet, Take 50 mg by mouth every morning., Disp: , Rfl:    naltrexone (DEPADE) 50 MG tablet, Take 50 mg by mouth at bedtime., Disp: , Rfl:    Omega-3 Fatty Acids (FISH OIL) 1000 MG CAPS, Take 2,400 mg by mouth daily., Disp: , Rfl:    traZODone (DESYREL) 50 MG tablet, Take 50 mg by mouth at bedtime as needed., Disp: , Rfl:    TRELEGY ELLIPTA 100-62.5-25 MCG/ACT AEPB, Inhale 1 puff into the lungs daily., Disp: , Rfl:

## 2022-05-03 NOTE — Telephone Encounter (Signed)
Primary Pulmonologist: Dr. Elsworth Soho  Last office visit and with whom: Dr. Elsworth Soho 02/22/2022 What do we see them for (pulmonary problems): COPD Last OV assessment/plan: see below   Was appointment offered to patient (explain)?  Offered in [place], pt declined and states Dr. Elsworth Soho has told her in past to call for an antibiotic/ prednisone if needed and we could send it in for her    Reason for call: Patient is coughing up yellow mucus. Is having a little more SOB than usual. Started Saturday but has worsened in the last couple of days. Denies fever. She states Dr. Elsworth Soho told her to call when she has a COPD flare up and to request an antibiotic/prednisone and he will send in to a pharmacy for her   Dr. Elsworth Soho is off so sending to DOD. Please advise      No Known Allergies  Immunization History  Administered Date(s) Administered   Fluad Quad(high Dose 65+) 08/31/2020   Influenza, High Dose Seasonal PF 08/26/2018   Moderna SARS-COV2 Booster Vaccination 12/01/2020, 01/29/2021   Moderna Sars-Covid-2 Vaccination 05/01/2020, 05/31/2020   Tdap 06/06/2018   Zoster Recombinat (Shingrix) 06/20/2018, 11/19/2018      Assessment & Plan:    Chronic respiratory failure with hypoxia -resolved Vaccinations up-to-date      Assessment & Plan Note by Rigoberto Noel, MD at 02/22/2022 11:15 AM  Author: Rigoberto Noel, MD Author Type: Physician Filed: 02/22/2022 11:16 AM  Note Status: Written Cosign: Cosign Not Required Encounter Date: 02/22/2022  Problem: COPD with chronic bronchitis and emphysema ([place])  Editor: Rigoberto Noel, MD (Physician)             Continue Trelegy, refills will be provided. She seldom needs albuterol We discussed COPD action plan and she will call us if needed for antibiotic/prednisone        Patient Instructions by Rigoberto Noel, MD at 02/22/2022 11:00 AM  Author: Rigoberto Noel, MD Author Type: Physician Filed: 02/22/2022 11:12 AM  Note Status: Signed Cosign: Cosign Not Required  Encounter Date: 02/22/2022  Editor: Rigoberto Noel, MD (Physician)               Continue on trelegy    COngratulations !       Orthostatic Vitals Recorded in This Encounter   02/22/2022  1059     Patient Position: Sitting  BP Location: Left Arm   Instructions   Return in about 6 months (around 08/24/2022).   Continue on trelegy    COngratulations !

## 2022-05-03 NOTE — Telephone Encounter (Signed)
Calling back for recs:

## 2022-05-31 NOTE — Progress Notes (Signed)
Referring Provider: Jake Samples, PA* Primary Care Physician:  Jake Samples, PA-C Primary GI Physician: Dr. Gala Romney  Chief Complaint  Patient presents with   Follow-up    Colonoscopy     HPI:   Brittany Miranda is a 71 y.o. female with history of COPD, anxiety/depression, alcohol abuse now abstinent following recent completion of rehab, HTN, adenomatous colon polyps, presenting today to discuss scheduling colonoscopy and follow-up on constipation.   Last colonoscopy in August 2017 with colonic diverticulosis, 5 mm tubular adenoma.  Recommended repeat in 5 years.   Last seen in our office 03/02/2022.  She reported new onset constipation after going to alcohol rehab in March 2023.  No alarm symptoms.  Aside from being absent from alcohol, she was also started on trazodone and naltrexone, both of which can cause constipation.  Symptoms adequately managed with stool softeners.  Plan to try Linzess 145 mcg daily and follow-up in couple of months to schedule colonoscopy once constipation is under better control.  Today:  Linzess is working Recruitment consultant. Bowels are moving very well. No brbpr or melena. No abdominal pain. No upper GI concerns. Denies nausea, vomiting, reflux symptoms, or dysphagia.   Had COPD exacerbation in late June after getting back from the beach. Using oxygen as needed. Usually needs it after working out in the yard. 2L.   SOB only with exertion.   Past Medical History:  Diagnosis Date   Anxiety    Colon polyps    adenomas in remote past    COPD (chronic obstructive pulmonary disease) ([place])    Depression    HTN (hypertension)    Osteopenia     Past Surgical History:  Procedure Laterality Date   APPENDECTOMY     COLONOSCOPY  11/2005   Dr. Ala Bent diverticula   COLONOSCOPY  2000   [facility]-->per patient colon polyps, adenomatous   COLONOSCOPY N/A 07/08/2016   Surgeon: Daneil Dolin, MD; colonic diverticulosis, one 5 mm tubular adenoma,  repeat in 5 years.   IRRIGATION AND DEBRIDEMENT OF WOUND WITH SPLIT THICKNESS SKIN GRAFT Left 08/13/2018   Procedure: IRRIGATION AND DEBRIDEMENT OF LEFT LEG WOUND WITH SPLIT THICKNESS SKIN GRAFT;  Surgeon: Cristine Polio, MD;  Location: [facility];  Service: Plastics;  Laterality: Left;   SKIN SPLIT GRAFT Left 08/13/2018   Procedure: SKIN GRAFT SPLIT THICKNESS;  Surgeon: Cristine Polio, MD;  Location: [facility];  Service: Plastics;  Laterality: Left;    Current Outpatient Medications  Medication Sig Dispense Refill   albuterol (PROVENTIL HFA;VENTOLIN HFA) 108 (90 Base) MCG/ACT inhaler Inhale 1 puff into the lungs every 6 (six) hours as needed.     augmented betamethasone dipropionate (DIPROLENE-AF) 0.05 % cream Apply 1 application topically 2 (two) times daily as needed.     Biotin 5 MG CAPS Take 1 capsule by mouth daily.     Calcium Carbonate-Vitamin D (CALTRATE 600+D PO) Take 600 mg by mouth daily.     DULoxetine (CYMBALTA) 60 MG capsule Take 60 mg by mouth daily.     ibuprofen (ADVIL) 400 MG tablet Take 1 tablet (400 mg total) by mouth every 8 (eight) hours as needed. 30 tablet 0   linaclotide (LINZESS) 145 MCG CAPS capsule Take 1 capsule (145 mcg total) by mouth daily before breakfast. 30 capsule 5   losartan (COZAAR) 50 MG tablet Take 50 mg by mouth every morning.     naltrexone (DEPADE) 50 MG tablet Take 50 mg by mouth  at bedtime.     Omega-3 Fatty Acids (FISH OIL) 1000 MG CAPS Take 2,400 mg by mouth daily.     traZODone (DESYREL) 50 MG tablet Take 50 mg by mouth at bedtime as needed.     TRELEGY ELLIPTA 100-62.5-25 MCG/ACT AEPB Inhale 1 puff into the lungs daily.     No current facility-administered medications for this visit.    Allergies as of 06/02/2022   (No Known Allergies)    Family History  Problem Relation Age of Onset   Kidney disease Mother    Kidney disease Father    Liver disease Neg Hx    Colon cancer Neg Hx     Social  History   Socioeconomic History   Marital status: Divorced    Spouse name: Not on file   Number of children: 1   Years of education: Not on file   Highest education level: Not on file  Occupational History   Occupation: retired    Fish farm manager: RETIRED    CommentArt gallery manager (nurse) at [employer] Use   Smoking status: Former    Packs/day: 1.00    Years: 30.00    Total pack years: 30.00    Types: Cigarettes    Quit date: 04/04/1998    Years since quitting: 24.1   Smokeless tobacco: Never  Vaping Use   Vaping Use: Never used  Substance and Sexual Activity   Alcohol use: Not Currently    Comment: Abstinent since March 2023.  Reports she was drinking "too much" and went to rehab.   Drug use: No   Sexual activity: Not Currently  Other Topics Concern   Not on file  Social History Narrative   Not on file   Social Determinants of Health   Financial Resource Strain: Not on file  Food Insecurity: Not on file  Transportation Needs: Not on file  Physical Activity: Not on file  Stress: Not on file  Social Connections: Not on file    Review of Systems: Gen: Denies fever, chills, cold or flulike symptoms, presyncope, syncope. CV: Denies chest pain, palpitations. Resp: Admits to SOB with exertion. No SOB at rest. No cough.  GI: See HPI Heme: See HPI  Physical Exam: BP 125/79 (BP Location: Right Arm, Patient Position: Sitting, Cuff Size: Normal)   Pulse 80   Temp 97.8 F (36.6 C) (Temporal)   Ht '5\' 2"'$  (1.575 m)   Wt 92 lb (41.7 kg)   BMI 16.83 kg/m  General:   Alert and oriented. No distress noted. Pleasant and cooperative.  Head:  Normocephalic and atraumatic. Eyes:  Conjuctiva clear without scleral icterus. Heart:  S1, S2 present without murmurs appreciated. Lungs:  Clear to auscultation bilaterally. No wheezes, rales, or rhonchi. No distress.  Abdomen:  +BS, soft, non-tender and non-distended. No rebound or guarding. No HSM or masses noted. Msk:  Symmetrical  without gross deformities. Normal posture. Extremities:  Without edema. Neurologic:  Alert and  oriented x4 Psych:  Normal mood and affect.    Assessment:  71 year old female with history of adenomatous colon polyps, HTN, COPD, anxiety/depression, constipation, presenting today for follow-up and to discuss scheduling surveillance colonoscopy.  History of colon polyps: Due for surveillance.  Last colonoscopy in August 2017 with colonic diverticulosis, 5 mm tubular adenoma.  Recommended 5-year repeat.  Currently without any significant lower GI symptoms.  No alarm symptoms.  No family history of colon cancer.  Constipation: Likely medication induced related to trazodone and naltrexone.  Symptoms well controlled  with Linzess 145 mcg daily.  Plan:  Proceed with colonoscopy with propofol by Dr. Gala Romney in near future. The risks, benefits, and alternatives have been discussed with the patient in detail. The patient states understanding and desires to proceed. ASA 3 Continue Linzess 145 mcg daily. Follow-up in 6 months or sooner if needed.   Aliene Altes, PA-C [facility] 06/02/2022

## 2022-06-02 ENCOUNTER — Encounter: Payer: Self-pay | Admitting: *Deleted

## 2022-06-02 ENCOUNTER — Ambulatory Visit: Payer: Medicare HMO | Admitting: Gastroenterology

## 2022-06-02 ENCOUNTER — Encounter: Payer: Self-pay | Admitting: Gastroenterology

## 2022-06-02 VITALS — BP 125/79 | HR 80 | Temp 97.8°F | Ht 62.0 in | Wt 92.0 lb

## 2022-06-02 DIAGNOSIS — Z86010 Personal history of colonic polyps: Secondary | ICD-10-CM

## 2022-06-02 DIAGNOSIS — K5900 Constipation, unspecified: Secondary | ICD-10-CM

## 2022-06-02 MED ORDER — NA SULFATE-K SULFATE-MG SULF 17.5-3.13-1.6 GM/177ML PO SOLN: ORAL | 0 refills | Status: DC

## 2022-06-02 NOTE — Patient Instructions (Signed)
We will arrange for you to have a colonoscopy in the near future with Dr. Gala Romney.   Continue taking Linzess 145 mcg daily.   We will see you back in 6 months. Do not hesitate to call sooner if you have questions or concerns.   It was great to see you again today!   Aliene Altes, PA-C [facility]

## 2022-07-07 NOTE — Patient Instructions (Signed)
Brittany Miranda  07/07/2022     '@PREFPERIOPPHARMACY'$ @   Your procedure is scheduled on  07/13/2022.   Report to [facility] at  [phone]  A.M.   Call this number if you have problems the morning of surgery:  [phone]   Remember:  Follow the diet and prep instructions given to you by the office.      Use your inhalers before you come and bring your rescue inhaler with you.     Take these medicines the morning of surgery with A SIP OF WATER                                               cymbalta.     Do not wear jewelry, make-up or nail polish.  Do not wear lotions, powders, or perfumes, or deodorant.  Do not shave 48 hours prior to surgery.  Men may shave face and neck.  Do not bring valuables to the hospital.  [facility] is not responsible for any belongings or valuables.  Contacts, dentures or bridgework may not be worn into surgery.  Leave your suitcase in the car.  After surgery it may be brought to your room.  For patients admitted to the hospital, discharge time will be determined by your treatment team.  Patients discharged the day of surgery will not be allowed to drive home and must have someone with them for 24 hours.    Special instructions:   DO NOT smoke tobacco or vape for 24 hours before your procedure.  Please read over the following fact sheets that you were given. Anesthesia Post-op Instructions and Care and Recovery After Surgery      Colonoscopy, Adult, Care After The following information offers guidance on how to care for yourself after your procedure. Your health care provider may also give you more specific instructions. If you have problems or questions, contact your health care provider. What can I expect after the procedure? After the procedure, it is common to have: A small amount of blood in your stool for 24 hours after the procedure. Some gas. Mild cramping or bloating of your abdomen. Follow these instructions at  home: Eating and drinking  Drink enough fluid to keep your urine pale yellow. Follow instructions from your health care provider about eating or drinking restrictions. Resume your normal diet as told by your health care provider. Avoid heavy or fried foods that are hard to digest. Activity Rest as told by your health care provider. Avoid sitting for a long time without moving. Get up to take short walks every 1-2 hours. This is important to improve blood flow and breathing. Ask for help if you feel weak or unsteady. Return to your normal activities as told by your health care provider. Ask your health care provider what activities are safe for you. Managing cramping and bloating  Try walking around when you have cramps or feel bloated. If directed, apply heat to your abdomen as told by your health care provider. Use the heat source that your health care provider recommends, such as a moist heat pack or a heating pad. Place a towel between your skin and the heat source. Leave the heat on for 20-30 minutes. Remove the heat if your skin turns bright red. This is especially important if you are unable to  feel pain, heat, or cold. You have a greater risk of getting burned. General instructions If you were given a sedative during the procedure, it can affect you for several hours. Do not drive or operate machinery until your health care provider says that it is safe. For the first 24 hours after the procedure: Do not sign important documents. Do not drink alcohol. Do your regular daily activities at a slower pace than normal. Eat soft foods that are easy to digest. Take over-the-counter and prescription medicines only as told by your health care provider. Keep all follow-up visits. This is important. Contact a health care provider if: You have blood in your stool 2-3 days after the procedure. Get help right away if: You have more than a small spotting of blood in your stool. You have large  blood clots in your stool. You have swelling of your abdomen. You have nausea or vomiting. You have a fever. You have increasing pain in your abdomen that is not relieved with medicine. These symptoms may be an emergency. Get help right away. Call 911. Do not wait to see if the symptoms will go away. Do not drive yourself to the hospital. Summary After the procedure, it is common to have a small amount of blood in your stool. You may also have mild cramping and bloating of your abdomen. If you were given a sedative during the procedure, it can affect you for several hours. Do not drive or operate machinery until your health care provider says that it is safe. Get help right away if you have a lot of blood in your stool, nausea or vomiting, a fever, or increased pain in your abdomen. This information is not intended to replace advice given to you by your health care provider. Make sure you discuss any questions you have with your health care provider. Document Revised: 06/23/2021 Document Reviewed: 06/23/2021 Elsevier Patient Education  [place] After This sheet gives you information about how to care for yourself after your procedure. Your health care provider may also give you more specific instructions. If you have problems or questions, contact your health care provider. What can I expect after the procedure? After the procedure, it is common to have: Tiredness. Forgetfulness about what happened after the procedure. Impaired judgment for important decisions. Nausea or vomiting. Some difficulty with balance. Follow these instructions at home: For the time period you were told by your health care provider:     Rest as needed. Do not participate in activities where you could fall or become injured. Do not drive or use machinery. Do not drink alcohol. Do not take sleeping pills or medicines that cause drowsiness. Do not make important  decisions or sign legal documents. Do not take care of children on your own. Eating and drinking Follow the diet that is recommended by your health care provider. Drink enough fluid to keep your urine pale yellow. If you vomit: Drink water, juice, or soup when you can drink without vomiting. Make sure you have little or no nausea before eating solid foods. General instructions Have a responsible adult stay with you for the time you are told. It is important to have someone help care for you until you are awake and alert. Take over-the-counter and prescription medicines only as told by your health care provider. If you have sleep apnea, surgery and certain medicines can increase your risk for breathing problems. Follow instructions from your health care provider about wearing  your sleep device: Anytime you are sleeping, including during daytime naps. While taking prescription pain medicines, sleeping medicines, or medicines that make you drowsy. Avoid smoking. Keep all follow-up visits as told by your health care provider. This is important. Contact a health care provider if: You keep feeling nauseous or you keep vomiting. You feel light-headed. You are still sleepy or having trouble with balance after 24 hours. You develop a rash. You have a fever. You have redness or swelling around the IV site. Get help right away if: You have trouble breathing. You have new-onset confusion at home. Summary For several hours after your procedure, you may feel tired. You may also be forgetful and have poor judgment. Have a responsible adult stay with you for the time you are told. It is important to have someone help care for you until you are awake and alert. Rest as told. Do not drive or operate machinery. Do not drink alcohol or take sleeping pills. Get help right away if you have trouble breathing, or if you suddenly become confused. This information is not intended to replace advice given to you  by your health care provider. Make sure you discuss any questions you have with your health care provider. Document Revised: 10/05/2021 Document Reviewed: 10/03/2019 Elsevier Patient Education  [place].

## 2022-07-11 ENCOUNTER — Encounter (HOSPITAL_COMMUNITY): Payer: Self-pay

## 2022-07-11 ENCOUNTER — Encounter (HOSPITAL_COMMUNITY)
Admission: RE | Admit: 2022-07-11 | Discharge: 2022-07-11 | Disposition: A | Discharge status: Home / Self Care | Payer: Medicare HMO | Source: Ambulatory Visit | Attending: Internal Medicine | Admitting: Internal Medicine

## 2022-07-11 VITALS — BP 142/95 | HR 74 | Temp 97.6°F | Resp 18 | Ht 62.0 in | Wt 91.9 lb

## 2022-07-11 DIAGNOSIS — Z01810 Encounter for preprocedural cardiovascular examination: Secondary | ICD-10-CM | POA: Insufficient documentation

## 2022-07-11 DIAGNOSIS — I10 Essential (primary) hypertension: Secondary | ICD-10-CM | POA: Insufficient documentation

## 2022-07-11 DIAGNOSIS — I4510 Unspecified right bundle-branch block: Secondary | ICD-10-CM | POA: Diagnosis not present

## 2022-07-11 DIAGNOSIS — R9431 Abnormal electrocardiogram [ECG] [EKG]: Secondary | ICD-10-CM | POA: Insufficient documentation

## 2022-07-11 HISTORY — DX: Other complications of anesthesia, initial encounter: T88.59XA

## 2022-07-13 ENCOUNTER — Encounter (HOSPITAL_COMMUNITY)
Admission: RE | Disposition: A | Discharge status: Home / Self Care | Payer: Self-pay | Source: Home / Self Care | Attending: Internal Medicine

## 2022-07-13 ENCOUNTER — Ambulatory Visit (HOSPITAL_COMMUNITY): Payer: Medicare HMO | Admitting: Certified Registered Nurse Anesthetist

## 2022-07-13 ENCOUNTER — Ambulatory Visit (HOSPITAL_COMMUNITY)
Admission: RE | Admit: 2022-07-13 | Discharge: 2022-07-13 | Disposition: A | Discharge status: Home / Self Care | Payer: Medicare HMO | Attending: Internal Medicine | Admitting: Internal Medicine

## 2022-07-13 ENCOUNTER — Encounter (HOSPITAL_COMMUNITY): Payer: Self-pay | Admitting: Internal Medicine

## 2022-07-13 ENCOUNTER — Ambulatory Visit (HOSPITAL_BASED_OUTPATIENT_CLINIC_OR_DEPARTMENT_OTHER): Payer: Medicare HMO | Admitting: Certified Registered Nurse Anesthetist

## 2022-07-13 DIAGNOSIS — Z86010 Personal history of colonic polyps: Secondary | ICD-10-CM | POA: Diagnosis not present

## 2022-07-13 DIAGNOSIS — I10 Essential (primary) hypertension: Secondary | ICD-10-CM | POA: Diagnosis not present

## 2022-07-13 DIAGNOSIS — K6389 Other specified diseases of intestine: Secondary | ICD-10-CM

## 2022-07-13 DIAGNOSIS — J449 Chronic obstructive pulmonary disease, unspecified: Secondary | ICD-10-CM | POA: Diagnosis not present

## 2022-07-13 DIAGNOSIS — K5790 Diverticulosis of intestine, part unspecified, without perforation or abscess without bleeding: Secondary | ICD-10-CM | POA: Diagnosis not present

## 2022-07-13 DIAGNOSIS — Z1211 Encounter for screening for malignant neoplasm of colon: Secondary | ICD-10-CM | POA: Diagnosis not present

## 2022-07-13 DIAGNOSIS — K5730 Diverticulosis of large intestine without perforation or abscess without bleeding: Secondary | ICD-10-CM | POA: Insufficient documentation

## 2022-07-13 DIAGNOSIS — Z09 Encounter for follow-up examination after completed treatment for conditions other than malignant neoplasm: Secondary | ICD-10-CM

## 2022-07-13 DIAGNOSIS — F418 Other specified anxiety disorders: Secondary | ICD-10-CM | POA: Insufficient documentation

## 2022-07-13 DIAGNOSIS — Z87891 Personal history of nicotine dependence: Secondary | ICD-10-CM | POA: Insufficient documentation

## 2022-07-13 HISTORY — PX: COLONOSCOPY WITH PROPOFOL: SHX5780

## 2022-07-13 SURGERY — COLONOSCOPY WITH PROPOFOL
Anesthesia: General

## 2022-07-13 MED ORDER — LACTATED RINGERS IV SOLN: INTRAVENOUS | Status: DC | PRN

## 2022-07-13 MED ORDER — PROPOFOL 10 MG/ML IV BOLUS
INTRAVENOUS | Status: DC | PRN
  Administered 2022-07-13: 60 mg via INTRAVENOUS

## 2022-07-13 MED ORDER — PROPOFOL 500 MG/50ML IV EMUL
INTRAVENOUS | Status: AC
  Filled 2022-07-13: qty 50

## 2022-07-13 MED ORDER — PROPOFOL 500 MG/50ML IV EMUL
INTRAVENOUS | Status: DC | PRN
  Administered 2022-07-13: 150 ug/kg/min via INTRAVENOUS

## 2022-07-13 NOTE — Op Note (Signed)
[facility] Patient Name: Brittany Miranda Procedure Date: 07/13/2022 8:03 AM MRN: [id number] Date of Birth: 1951/08/20 Attending MD: Norvel Richards , MD CSN: [id number] Age: 71 Admit Type: Outpatient Procedure:                Colonoscopy Indications:              High risk colon cancer surveillance: Personal                            history of colonic polyps Providers:                Norvel Richards, MD, Janeece Riggers, RN, Raphael Gibney, Technician Referring MD:              Medicines:                Propofol per Anesthesia Complications:            No immediate complications. Estimated Blood Loss:     Estimated blood loss: none. Procedure:                Pre-Anesthesia Assessment:                           - Prior to the procedure, a History and Physical                            was performed, and patient medications and                            allergies were reviewed. The patient's tolerance of                            previous anesthesia was also reviewed. The risks                            and benefits of the procedure and the sedation                            options and risks were discussed with the patient.                            All questions were answered, and informed consent                            was obtained. Prior Anticoagulants: The patient has                            taken no previous anticoagulant or antiplatelet                            agents. ASA Grade Assessment: III - A patient with  severe systemic disease. After reviewing the risks                            and benefits, the patient was deemed in                            satisfactory condition to undergo the procedure.                           After obtaining informed consent, the colonoscope                            was passed under direct vision. Throughout the                            procedure, the patient's  blood pressure, pulse, and                            oxygen saturations were monitored continuously. The                            [id number]) scope was introduced through the                            anus and advanced to the the cecum, identified by                            appendiceal orifice and ileocecal valve. The                            colonoscopy was performed without difficulty. The                            patient tolerated the procedure well. The quality                            of the bowel preparation was adequate. Scope In: 8:18:29 AM Scope Out: 8:32:46 AM Scope Withdrawal Time: 0 hours 6 minutes 21 seconds  Total Procedure Duration: 0 hours 14 minutes 17 seconds  Findings:      The perianal and digital rectal examinations were normal.      Scattered small and large-mouthed diverticula were found in the entire       colon. Mildly diffusely pigmented colonic mucosa. Slightly redundant       colon requiring external abdominal pressure to reach the cecum.      The exam was otherwise without abnormality on direct and retroflexion       views. Impression:               - Diverticulosis in the entire examined colon.                            Melanosis coli. Mildly redundant colon.                           - The  examination was otherwise normal on direct                            and retroflexion views.                           - No specimens collected. Moderate Sedation:      Moderate (conscious) sedation was personally administered by an       anesthesia professional. The following parameters were monitored: oxygen       saturation, heart rate, blood pressure, respiratory rate, EKG, adequacy       of pulmonary ventilation, and response to care. Recommendation:           - Patient has a contact number available for                            emergencies. The signs and symptoms of potential                            delayed complications were discussed  with the                            patient. Return to normal activities tomorrow.                            Written discharge instructions were provided to the                            patient.                           - Resume previous diet.                           - Continue present medications.                           - Repeat colonoscopy in 7 years for surveillance if                            overall health permits..                           - Return to GI office (date not yet determined). Procedure Code(s):        --- Professional ---                           [phone], Colonoscopy, flexible; diagnostic, including                            collection of specimen(s) by brushing or washing,                            when performed (separate procedure) Diagnosis Code(s):        --- Professional ---  Z12.11, Encounter for screening for malignant                            neoplasm of colon                           Z86.010, Personal history of colonic polyps                           K57.30, Diverticulosis of large intestine without                            perforation or abscess without bleeding CPT copyright 2019 American Medical Association. All rights reserved. The codes documented in this report are preliminary and upon coder review may  be revised to meet current compliance requirements. Cristopher Estimable. Serai Tukes, MD Norvel Richards, MD 07/13/2022 8:44:05 AM This report has been signed electronically. Number of Addenda: 0

## 2022-07-13 NOTE — H&P (Signed)
$'@LOGO'S$ @   Primary Care Physician:  Scherrie Bateman Primary Gastroenterologist:  Dr. Gala Romney  Pre-Procedure History & Physical: HPI:  Brittany Miranda is a 71 y.o. female here for   Surveillance colonoscopy.  History of multiple colonic adenomas removed over time.  Doing well from a GI standpoint.  Constipation well managed with Linzess.  Past Medical History:  Diagnosis Date   Anxiety    Colon polyps    adenomas in remote past    Complication of anesthesia    COPD (chronic obstructive pulmonary disease) ([place])    Depression    HTN (hypertension)    Osteopenia     Past Surgical History:  Procedure Laterality Date   APPENDECTOMY     COLONOSCOPY  11/2005   Dr. Ala Bent diverticula   COLONOSCOPY  2000   [facility]-->per patient colon polyps, adenomatous   COLONOSCOPY N/A 07/08/2016   Surgeon: Daneil Dolin, MD; colonic diverticulosis, one 5 mm tubular adenoma, repeat in 5 years.   IRRIGATION AND DEBRIDEMENT OF WOUND WITH SPLIT THICKNESS SKIN GRAFT Left 08/13/2018   Procedure: IRRIGATION AND DEBRIDEMENT OF LEFT LEG WOUND WITH SPLIT THICKNESS SKIN GRAFT;  Surgeon: Cristine Polio, MD;  Location: [place];  Service: Plastics;  Laterality: Left;   SKIN SPLIT GRAFT Left 08/13/2018   Procedure: SKIN GRAFT SPLIT THICKNESS;  Surgeon: Cristine Polio, MD;  Location: [place];  Service: Plastics;  Laterality: Left;    Prior to Admission medications   Medication Sig Start Date End Date Taking? Authorizing Provider  albuterol (PROVENTIL HFA;VENTOLIN HFA) 108 (90 Base) MCG/ACT inhaler Inhale 1 puff into the lungs every 6 (six) hours as needed for wheezing or shortness of breath. 05/08/18  Yes [provider]  augmented betamethasone dipropionate (DIPROLENE-AF) 0.05 % cream Apply 1 application  topically 2 (two) times daily as needed (eczema). 06/17/21  Yes [provider]  Biotin 5 MG CAPS Take 5 mg by mouth daily.   Yes  [provider]  Calcium Carbonate-Vitamin D (CALTRATE 600+D PO) Take 600 mg by mouth daily.   Yes [provider]  carboxymethylcellulose (REFRESH PLUS) 0.5 % SOLN Place 1 drop into both eyes daily as needed (moisture prior to putting in contacts).   Yes [provider]  DULoxetine (CYMBALTA) 60 MG capsule Take 60 mg by mouth daily. 07/15/21  Yes [provider]  linaclotide Rolan Lipa) 145 MCG CAPS capsule Take 1 capsule (145 mcg total) by mouth daily before breakfast. 03/07/22  Yes Aliene Altes S, PA-C  losartan (COZAAR) 25 MG tablet Take 25 mg by mouth at bedtime. 01/29/22  Yes [provider]  Multiple Vitamin (MULTIVITAMIN WITH MINERALS) TABS tablet Take 1 tablet by mouth daily.   Yes [provider]  naltrexone (DEPADE) 50 MG tablet Take 50 mg by mouth at bedtime. 02/15/22  Yes [provider]  Omega-3 Fatty Acids (FISH OIL) 1000 MG CAPS Take 2,400 mg by mouth daily.   Yes [provider]  traZODone (DESYREL) 50 MG tablet Take 50 mg by mouth at bedtime. 02/15/22  Yes [provider]  TRELEGY ELLIPTA 100-62.5-25 MCG/ACT AEPB Inhale 1 puff into the lungs daily. 09/25/21  Yes [provider]  ibuprofen (ADVIL) 200 MG tablet Take 400 mg by mouth every 6 (six) hours as needed for moderate pain.    [provider]  ibuprofen (ADVIL) 400 MG tablet Take 1 tablet (400 mg total) by mouth every 8 (eight) hours as needed. Patient not taking:  Reported on 07/06/2022 11/24/21   Jaynee Eagles, PA-C  Na Sulfate-K Sulfate-Mg Sulf 17.5-3.13-1.6 GM/177ML SOLN As directed 06/02/22   Daneil Dolin, MD    Allergies as of 06/02/2022   (No Known Allergies)    Family History  Problem Relation Age of Onset   Kidney disease Mother    Kidney disease Father    Liver disease Neg Hx    Colon cancer Neg Hx     Social History   Socioeconomic History   Marital status: Divorced    Spouse name: Not on file   Number of  children: 1   Years of education: Not on file   Highest education level: Not on file  Occupational History   Occupation: retired    Fish farm manager: RETIRED    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  Reports she was drinking "too much" and went to rehab.   Drug use: No   Sexual activity: Not Currently  Other Topics Concern   Not on file  Social History Narrative   Not on file   Social Determinants of Health   Financial Resource Strain: Not on file  Food Insecurity: Not on file  Transportation Needs: Not on file  Physical Activity: Not on file  Stress: Not on file  Social Connections: Not on file  Intimate Partner Violence: Not on file    Review of Systems: See HPI, otherwise negative ROS  Physical Exam: BP (!) 160/85   Pulse 64   Temp 98.1 F (36.7 C) (Oral)   Resp 20   Ht '5\' 2"'$  (1.575 m)   Wt 41.7 kg   SpO2 100%   BMI 16.81 kg/m  General:   Alert,  Well-developed, well-nourished, pleasant and cooperative in NAD Neck:  Supple; no masses or thyromegaly. No significant cervical adenopathy. Lungs:  Clear throughout to auscultation.   No wheezes, crackles, or rhonchi. No acute distress. Heart:  Regular rate and rhythm; no murmurs, clicks, rubs,  or gallops. Abdomen: Non-distended, normal bowel sounds.  Soft and nontender without appreciable mass or hepatosplenomegaly.  Pulses:  Normal pulses noted. Extremities:  Without clubbing or edema.  Impression/Plan:    72 year old lady with a history of multiple colonic adenomas removed over time here for surveillance colonoscopy per plan.  I have offered the patient a surveillance  colonoscopy. The risks, benefits, limitations, alternatives and imponderables have been reviewed with the patient. Questions have been answered. All parties are agreeable.       Notice: This dictation was prepared with Dragon dictation along with smaller phrase technology. Any transcriptional errors that result from this process are unintentional and may not be corrected upon review.

## 2022-07-13 NOTE — Transfer of Care (Signed)
Immediate Anesthesia Transfer of Care Note  Patient: Brittany Miranda  Procedure(s) Performed: COLONOSCOPY WITH PROPOFOL  Patient Location: PACU  Anesthesia Type:General  Level of Consciousness: awake, alert  and oriented  Airway & Oxygen Therapy: Patient Spontanous Breathing  Post-op Assessment: Report given to RN, Post -op Vital signs reviewed and stable, Patient moving all extremities X 4 and Patient able to stick tongue midline  Post vital signs: Reviewed  Last Vitals:  Vitals Value Taken Time  BP 119/72 07/13/22 0836  Temp 36.3 C 07/13/22 0836  Pulse 69 07/13/22 0836  Resp 20 07/13/22 0836  SpO2 100 % 07/13/22 0836    Last Pain:  Vitals:   07/13/22 0836  TempSrc: Oral  PainSc: 0-No pain         Complications: No notable events documented.

## 2022-07-13 NOTE — Anesthesia Preprocedure Evaluation (Signed)
Anesthesia Evaluation  Patient identified by MRN, date of birth, ID band Patient awake    Reviewed: Allergy & Precautions, H&P , NPO status , Patient's Chart, lab work & pertinent test results, reviewed documented beta blocker date and time   Airway Mallampati: I  TM Distance: >3 FB Neck ROM: full    Dental no notable dental hx.    Pulmonary COPD, former smoker,    Pulmonary exam normal breath sounds clear to auscultation       Cardiovascular Exercise Tolerance: Good hypertension, negative cardio ROS   Rhythm:regular Rate:Normal     Neuro/Psych PSYCHIATRIC DISORDERS Anxiety Depression negative neurological ROS     GI/Hepatic negative GI ROS, Neg liver ROS,   Endo/Other  negative endocrine ROS  Renal/GU negative Renal ROS  negative genitourinary   Musculoskeletal   Abdominal   Peds  Hematology negative hematology ROS (+)   Anesthesia Other Findings   Reproductive/Obstetrics negative OB ROS                             Anesthesia Physical Anesthesia Plan  ASA: 3  Anesthesia Plan: General   Post-op Pain Management:    Induction:   PONV Risk Score and Plan: Propofol infusion  Airway Management Planned:   Additional Equipment:   Intra-op Plan:   Post-operative Plan:   Informed Consent: I have reviewed the patients History and Physical, chart, labs and discussed the procedure including the risks, benefits and alternatives for the proposed anesthesia with the patient or authorized representative who has indicated his/her understanding and acceptance.     Dental Advisory Given  Plan Discussed with: CRNA  Anesthesia Plan Comments:         Anesthesia Quick Evaluation

## 2022-07-13 NOTE — Discharge Instructions (Signed)
  Colonoscopy Discharge Instructions  Read the instructions outlined below and refer to this sheet in the next few weeks. These discharge instructions provide you with general information on caring for yourself after you leave the hospital. Your doctor may also give you specific instructions. While your treatment has been planned according to the most current medical practices available, unavoidable complications occasionally occur. If you have any problems or questions after discharge, call Dr. Gala Romney at [phone]. ACTIVITY You may resume your regular activity, but move at a slower pace for the next 24 hours.  Take frequent rest periods for the next 24 hours.  Walking will help get rid of the air and reduce the bloated feeling in your belly (abdomen).  No driving for 24 hours (because of the medicine (anesthesia) used during the test).   Do not sign any important legal documents or operate any machinery for 24 hours (because of the anesthesia used during the test).  NUTRITION Drink plenty of fluids.  You may resume your normal diet as instructed by your doctor.  Begin with a light meal and progress to your normal diet. Heavy or fried foods are harder to digest and may make you feel sick to your stomach (nauseated).  Avoid alcoholic beverages for 24 hours or as instructed.  MEDICATIONS You may resume your normal medications unless your doctor tells you otherwise.  WHAT YOU CAN EXPECT TODAY Some feelings of bloating in the abdomen.  Passage of more gas than usual.  Spotting of blood in your stool or on the toilet paper.  IF YOU HAD POLYPS REMOVED DURING THE COLONOSCOPY: No aspirin products for 7 days or as instructed.  No alcohol for 7 days or as instructed.  Eat a soft diet for the next 24 hours.  FINDING OUT THE RESULTS OF YOUR TEST Not all test results are available during your visit. If your test results are not back during the visit, make an appointment with your caregiver to find out the  results. Do not assume everything is normal if you have not heard from your caregiver or the medical facility. It is important for you to follow up on all of your test results.  SEEK IMMEDIATE MEDICAL ATTENTION IF: You have more than a spotting of blood in your stool.  Your belly is swollen (abdominal distention).  You are nauseated or vomiting.  You have a temperature over 101.  You have abdominal pain or discomfort that is severe or gets worse throughout the day.    No polyps found today!  Diverticulosis information provided  Recommend 1 more colonoscopy in 7 years if overall health permits.   At patient request, I called Audrea [facility] at [phone] findings

## 2022-07-14 NOTE — Anesthesia Postprocedure Evaluation (Signed)
Anesthesia Post Note  Patient: Brittany Miranda  Procedure(s) Performed: COLONOSCOPY WITH PROPOFOL  Patient location during evaluation: Phase II Anesthesia Type: General Level of consciousness: awake Pain management: pain level controlled Vital Signs Assessment: post-procedure vital signs reviewed and stable Respiratory status: spontaneous breathing and respiratory function stable Cardiovascular status: blood pressure returned to baseline and stable Postop Assessment: no headache and no apparent nausea or vomiting Anesthetic complications: no Comments: Late entry   No notable events documented.   Last Vitals:  Vitals:   07/13/22 0735 07/13/22 0836  BP: (!) 160/85 119/72  Pulse: 64 69  Resp: 20 20  Temp: 36.7 C (!) 36.3 C  SpO2: 100% 100%    Last Pain:  Vitals:   07/14/22 1022  TempSrc:   PainSc: 0-No pain                 Louann Sjogren

## 2022-07-20 ENCOUNTER — Encounter (HOSPITAL_COMMUNITY): Payer: Self-pay | Admitting: Internal Medicine

## 2022-08-31 ENCOUNTER — Encounter: Payer: Self-pay | Admitting: Pulmonary Disease

## 2022-08-31 ENCOUNTER — Ambulatory Visit: Payer: Medicare HMO | Admitting: Pulmonary Disease

## 2022-08-31 DIAGNOSIS — J9611 Chronic respiratory failure with hypoxia: Secondary | ICD-10-CM | POA: Diagnosis not present

## 2022-08-31 DIAGNOSIS — J439 Emphysema, unspecified: Secondary | ICD-10-CM

## 2022-08-31 DIAGNOSIS — J4489 Other specified chronic obstructive pulmonary disease: Secondary | ICD-10-CM

## 2022-08-31 NOTE — Patient Instructions (Signed)
  X CT chest wo con for nodule

## 2022-08-31 NOTE — Assessment & Plan Note (Signed)
She will use oxygen on an as-needed basis

## 2022-08-31 NOTE — Progress Notes (Signed)
   Subjective:    Patient ID: Brittany Miranda, female    DOB: Nov 05, 1951, 71 y.o.   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 Initial office visit 01/2019 , started on Trelegy. She needed nocturnal oxygen transiently after hospital visit for an exacerbation   -completed alcohol rehab program and  weaned herself off Xanax  Chief Complaint  Patient presents with   Follow-up    Breathing doing well    63-monthfollow-up visit She was sick in June and required doxycycline and prednisone, oxygen saturation was in the low 80s and was restarted on home oxygen.  Since then she has weaned herself off and only uses this as needed. She continues to stay active, is able to walk her dog for 30 minutes every day. She is compliant with Trelegy  We reviewed chest x-ray which shows hyperinflation and nipple shadow suggestive of nodule  Significant tests/ events reviewed  03/2021 >On ambulation heart rate increased from 80-1 03 and oxygen saturation dropped from 96 to 91% on 2 laps   PFTs 03/2021 ratio 44, FEV1 49%, DLCO 30%, severe airway obstruction  Review of Systems neg for any significant sore throat, dysphagia, itching, sneezing, nasal congestion or excess/ purulent secretions, fever, chills, sweats, unintended wt loss, pleuritic or exertional cp, hempoptysis, orthopnea pnd or change in chronic leg swelling. Also denies presyncope, palpitations, heartburn, abdominal pain, nausea, vomiting, diarrhea or change in bowel or urinary habits, dysuria,hematuria, rash, arthralgias, visual complaints, headache, numbness weakness or ataxia.     Objective:   Physical Exam  Gen. Pleasant, petite , thin, in no distress ENT - no thrush, no pallor/icterus,no post nasal drip Neck: No JVD, no thyromegaly, no carotid bruits Lungs: no use of accessory muscles, no dullness to percussion, clear without rales or rhonchi  Cardiovascular: Rhythm regular, heart sounds  normal, no murmurs or gallops,  no peripheral edema Musculoskeletal: No deformities, no cyanosis or clubbing        Assessment & Plan:   Pulmonary nodule noted on chest x-ray -proceed with CT chest without contrast This will also serve as a screening study

## 2022-08-31 NOTE — Assessment & Plan Note (Signed)
Continue Trelegy. Use albuterol only for rescue. We discussed COPD action plan and signs and symptoms of COPD exacerbation 

## 2022-09-07 ENCOUNTER — Other Ambulatory Visit: Payer: Self-pay | Admitting: Gastroenterology

## 2022-09-07 DIAGNOSIS — K5904 Chronic idiopathic constipation: Secondary | ICD-10-CM

## 2022-11-24 ENCOUNTER — Encounter: Payer: Self-pay | Admitting: Gastroenterology

## 2022-12-13 ENCOUNTER — Ambulatory Visit: Payer: Medicare HMO | Admitting: Internal Medicine

## 2022-12-13 ENCOUNTER — Encounter: Payer: Self-pay | Admitting: Gastroenterology

## 2022-12-13 NOTE — Progress Notes (Unsigned)
Primary Care Physician:  Jake Samples, PA-C  Primary GI: Dr. Gala Romney  Patient Location: Home   Provider Location: [facility] office   Reason for Visit: Follow-up   Persons present on the virtual encounter, with roles: Aliene Altes, PA-C (Provider), Brittany Miranda (patient)   Total time (minutes) spent on medical discussion: 5 minutes  Virtual Visit via video note I connected with Brittany Miranda on 12/14/22 at  1:30 PM EST by video and verified that I am speaking with the correct person using two identifiers.   I discussed the limitations, risks, security and privacy concerns of performing an evaluation and management service by video and the availability of in person appointments. I also discussed with the patient that there may be a patient responsible charge related to this service. The patient expressed understanding and agreed to proceed.  Chief Complaint  Patient presents with   Follow-up     History of Present Illness: 72 year old female with history of COPD, anxiety/depression, alcohol abuse now sober since 2023 following rehab, HTN, adenomatous colon polyps, constipation, presenting today for routine follow-up.  Last seen in our office 06/02/2022.  Reported constipation was well-controlled on Linzess 145 mcg daily.  She was scheduled for routine surveillance colonoscopy with plans for follow-up in 6 months.  Colonoscopy 07/13/2022: Pancolonic diverticulosis, melanosis coli, mildly redundant colon.  Recommended repeat colonoscopy in 7 years for surveillance if overall health permits.  Today: Doing well overall.  Constipation is well-controlled on Linzess 145 mcg daily.  No abdominal pain, BRBPR, melena, unintentional weight loss.  No upper GI symptoms such as heartburn, nausea, vomiting, dysphagia.   Past Medical History:  Diagnosis Date   Anxiety    Colon polyps    adenomas in remote past    Complication of anesthesia    COPD (chronic obstructive pulmonary disease)  ([place])    Depression    HTN (hypertension)    Osteopenia      Past Surgical History:  Procedure Laterality Date   APPENDECTOMY     COLONOSCOPY  11/2005   Dr. Ala Bent diverticula   COLONOSCOPY  2000   [facility]-->per patient colon polyps, adenomatous   COLONOSCOPY N/A 07/08/2016   Surgeon: Daneil Dolin, MD; colonic diverticulosis, one 5 mm tubular adenoma, repeat in 5 years.   COLONOSCOPY WITH PROPOFOL N/A 07/13/2022   Surgeon: Daneil Dolin, MD; Pancolonic diverticulosis, melanosis coli, mildly redundant colon.  Recommended repeat colonoscopy in 7 years for surveillance if overall health permits.   IRRIGATION AND DEBRIDEMENT OF WOUND WITH SPLIT THICKNESS SKIN GRAFT Left 08/13/2018   Procedure: IRRIGATION AND DEBRIDEMENT OF LEFT LEG WOUND WITH SPLIT THICKNESS SKIN GRAFT;  Surgeon: Cristine Polio, MD;  Location: [place];  Service: Plastics;  Laterality: Left;   SKIN SPLIT GRAFT Left 08/13/2018   Procedure: SKIN GRAFT SPLIT THICKNESS;  Surgeon: Cristine Polio, MD;  Location: [place];  Service: Plastics;  Laterality: Left;     Current Meds  Medication Sig   albuterol (PROVENTIL HFA;VENTOLIN HFA) 108 (90 Base) MCG/ACT inhaler Inhale 1 puff into the lungs every 6 (six) hours as needed for wheezing or shortness of breath.   augmented betamethasone dipropionate (DIPROLENE-AF) 0.05 % cream Apply 1 application  topically 2 (two) times daily as needed (eczema).   Calcium Carbonate-Vitamin D (CALTRATE 600+D PO) Take 600 mg by mouth daily.   DULoxetine (CYMBALTA) 60 MG capsule Take 60 mg by mouth daily.   ibuprofen (ADVIL) 200 MG tablet Take 400 mg  by mouth every 6 (six) hours as needed for moderate pain.   LINZESS 145 MCG CAPS capsule TAKE ONE CAPSULE (145MCG TOTAL) BY MOUTHDAILY BEFORE BREAKFAST   losartan (COZAAR) 25 MG tablet Take 25 mg by mouth at bedtime.   Multiple Vitamin (MULTIVITAMIN WITH MINERALS) TABS tablet Take 1 tablet by mouth  daily.   Na Sulfate-K Sulfate-Mg Sulf 17.5-3.13-1.6 GM/177ML SOLN As directed   naltrexone (DEPADE) 50 MG tablet Take 50 mg by mouth at bedtime.   Omega-3 Fatty Acids (FISH OIL) 1000 MG CAPS Take 2,400 mg by mouth daily.   traZODone (DESYREL) 50 MG tablet Take 50 mg by mouth at bedtime.   TRELEGY ELLIPTA 100-62.5-25 MCG/ACT AEPB Inhale 1 puff into the lungs daily.     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  Reports she was drinking "too much" and went to rehab.   Drug use: No   Sexual activity: Not Currently  Other Topics Concern   Not on file  Social History Narrative   Not on file   Social Determinants of Health   Financial Resource Strain: Not on file  Food Insecurity: Not on file  Transportation Needs: Not on file  Physical Activity: Not on file  Stress: Not on file  Social Connections: Not on file       Review of Systems: Gen: Denies fever, chills, cold or flulike symptoms, presyncope, syncope. CV: Denies chest pain, palpitations. Resp: Denies dyspnea, cough. GI: see HPI Heme: See HPI  Observations/Objective: No distress. Alert and oriented. Pleasant. Well nourished. Normal mood and affect. Unable to perform  complete physical exam due to video encounter.    Assessment:  72 year old female with history of COPD, anxiety/depression, alcohol abuse now sober since 2023 following rehab, HTN, adenomatous colon polyps, constipation, presenting today for routine follow-up of constipation.   Constipation:  Likely medication induced related to trazodone and naltrexone. Well-controlled on Linzess 104 5 mcg daily.  No alarm symptoms.  Colonoscopy up-to-date in August 2023 with recommendations for repeat in 7 years if overall health permits.   Plan: Continue Linzess 145 mcg daily. Follow-up in 1 year or sooner if needed.     I discussed the assessment and treatment plan with the patient. The patient was provided an opportunity to ask questions and all were answered. The patient agreed with the plan and demonstrated an understanding of the instructions.   The patient was advised to call back or seek an in-person evaluation if the symptoms worsen or if the condition fails to improve  as anticipated.  I provided 5 minutes of video-face-to-face time during this encounter.  Aliene Altes, PA-C [facility]  12/14/2022

## 2022-12-14 ENCOUNTER — Encounter: Payer: Self-pay | Admitting: Gastroenterology

## 2022-12-14 ENCOUNTER — Telehealth (INDEPENDENT_AMBULATORY_CARE_PROVIDER_SITE_OTHER): Payer: Medicare HMO | Admitting: Gastroenterology

## 2022-12-14 VITALS — Ht 63.0 in | Wt 93.0 lb

## 2022-12-14 DIAGNOSIS — K5900 Constipation, unspecified: Secondary | ICD-10-CM | POA: Diagnosis not present

## 2022-12-14 NOTE — Patient Instructions (Signed)
Continue Linzess 145 mcg daily 30 minutes before breakfast.  Follow-up in 1 year or sooner if needed.  It was great to see you again today!  I am glad you are doing well!  Aliene Altes, PA-C [facility] Gastroenterology

## 2022-12-23 ENCOUNTER — Telehealth: Payer: Self-pay | Admitting: Pulmonary Disease

## 2022-12-23 MED ORDER — AZITHROMYCIN 250 MG PO TABS: ORAL_TABLET | ORAL | 0 refills | Status: DC

## 2022-12-23 MED ORDER — PREDNISONE 20 MG PO TABS: 20.0000 mg | ORAL_TABLET | Freq: Every day | ORAL | 0 refills | Status: DC

## 2022-12-23 NOTE — Telephone Encounter (Signed)
Primary Pulmonologist: Dr> Elsworth Soho  Last office visit and with whom: 08/31/2022 Dr. Elsworth Soho  What do we see them for (pulmonary problems): COPD chronic bronchitis  Last OV assessment/plan: see below   Was appointment offered to patient (explain)?  No availability in [facility] office    Reason for call: Starting yesterday productive cough with yellow mucus. Has some wheezing. Denies fever, and increased SOB.  Has not taken anything OTC yet and states Dr. Elsworth Soho told her to call when she starts having COPD exacerbation symptoms so he can send something in for her.  RX: [facility]  Dr. Elsworth Soho is out of office so sending to Rexene Edison NP DOD.  Please advise. Thank you!   No Known Allergies  Immunization History  Administered Date(s) Administered   Fluad Quad(high Dose 65+) 08/31/2020   Influenza, High Dose Seasonal PF 08/26/2018   Influenza-Unspecified 08/13/2022   Moderna SARS-COV2 Booster Vaccination 12/01/2020, 01/29/2021   Moderna Sars-Covid-2 Vaccination 05/01/2020, 05/31/2020   Tdap 06/06/2018   Zoster Recombinat (Shingrix) 06/20/2018, 11/19/2018    Assessment & Plan:    Pulmonary nodule noted on chest x-ray -proceed with CT chest without contrast This will also serve as a screening study      Assessment & Plan Note by Rigoberto Noel, MD at 08/31/2022 1:39 PM  Author: Rigoberto Noel, MD Author Type: Physician Filed: 08/31/2022  1:39 PM  Note Status: Written Cosign: Cosign Not Required Encounter Date: 08/31/2022  Problem: Chronic respiratory failure with hypoxia ([place])  Editor: Rigoberto Noel, MD (Physician)             She will use oxygen on an as-needed basis        Assessment & Plan Note by Rigoberto Noel, MD at 08/31/2022 1:39 PM  Author: Rigoberto Noel, MD Author Type: Physician Filed: 08/31/2022  1:39 PM  Note Status: Written Cosign: Cosign Not Required Encounter Date: 08/31/2022  Problem: COPD with chronic bronchitis and emphysema ([place])  Editor: Rigoberto Noel, MD  (Physician)             Continue Trelegy. Use albuterol only for rescue. We discussed COPD action plan and signs and symptoms of COPD exacerbation

## 2022-12-23 NOTE — Telephone Encounter (Signed)
Begin Z-Pak No. 1 take as directed, #1no refills  prednisone 20 mg daily for 5 days, #5  no refills  Mucinex twice daily as needed for cough and congestion continue on Trelegy daily.  Albuterol as needed.  If symptoms or not improved will need office visit for further evaluation make sure she keeps her follow-up with Dr. Elsworth Soho as planned  Please contact office for sooner follow up if symptoms do not improve or worsen or seek emergency care

## 2022-12-23 NOTE — Telephone Encounter (Signed)
Called and spoke with pt letting her know recs per TP and she verbalized understanding. Verified preferred pharmacy and sent both zpak and pred to pharmacy for pt. Nothing further needed.

## 2023-03-03 ENCOUNTER — Telehealth: Payer: Self-pay | Admitting: Pulmonary Disease

## 2023-03-03 NOTE — Telephone Encounter (Signed)
Patient called to request some Prednisone and antibiotic for her bad cough.  She stated the doctor told her to call to request these meds. If she had a bad cough with yellow mucus.  Please advise.

## 2023-03-06 ENCOUNTER — Ambulatory Visit: Payer: Medicare HMO

## 2023-03-06 MED ORDER — PREDNISONE 10 MG PO TABS: ORAL_TABLET | ORAL | 0 refills | Status: AC

## 2023-03-06 NOTE — Telephone Encounter (Signed)
Called and spoke with patient. Cough started Wednesday night and she's coughing up thick yellow stuff. Patient has a bad productive cough. She wants to know if we could send in prednisone and an antibiotic for her cough.   RA, please advise.

## 2023-03-06 NOTE — Telephone Encounter (Signed)
Patient just calling for an update. Patient would like prednisone to be called into [facility] P: [phone]. Please advise and call patient back

## 2023-03-06 NOTE — Telephone Encounter (Signed)
Rx has been sent to the patients pharmacy.   Nothing further needed.  

## 2023-03-07 ENCOUNTER — Telehealth: Payer: Self-pay | Admitting: *Deleted

## 2023-03-07 MED ORDER — AZITHROMYCIN 250 MG PO TABS: ORAL_TABLET | ORAL | 0 refills | Status: DC

## 2023-03-07 NOTE — Telephone Encounter (Signed)
Zpak ordered per provider note 4/22

## 2023-03-07 NOTE — Telephone Encounter (Signed)
Patient states that pharmacy did not receive an order for an antibiotic, and only had the order for prednisone. Patient is requesting an antibiotic. Please call and advise [phone]

## 2023-03-28 ENCOUNTER — Ambulatory Visit: Payer: Medicare HMO | Admitting: Pulmonary Disease

## 2023-03-30 ENCOUNTER — Encounter: Payer: Self-pay | Admitting: Pulmonary Disease

## 2023-03-30 ENCOUNTER — Ambulatory Visit: Payer: Medicare HMO | Admitting: Pulmonary Disease

## 2023-03-30 VITALS — BP 111/67 | HR 75 | Ht 63.0 in | Wt 92.6 lb

## 2023-03-30 DIAGNOSIS — J439 Emphysema, unspecified: Secondary | ICD-10-CM

## 2023-03-30 DIAGNOSIS — Z87891 Personal history of nicotine dependence: Secondary | ICD-10-CM

## 2023-03-30 DIAGNOSIS — J4489 Other specified chronic obstructive pulmonary disease: Secondary | ICD-10-CM

## 2023-03-30 DIAGNOSIS — Z720 Tobacco use: Secondary | ICD-10-CM

## 2023-03-30 DIAGNOSIS — J9611 Chronic respiratory failure with hypoxia: Secondary | ICD-10-CM

## 2023-03-30 MED ORDER — AZITHROMYCIN 250 MG PO TABS: ORAL_TABLET | ORAL | 0 refills | Status: AC

## 2023-03-30 MED ORDER — PREDNISONE 10 MG PO TABS: ORAL_TABLET | ORAL | 0 refills | Status: AC

## 2023-03-30 NOTE — Assessment & Plan Note (Signed)
With her extensive smoking history she will qualify for lung cancer screening.  We discussed risks and benefits of the same including the possibility of other findings with CT scan

## 2023-03-30 NOTE — Assessment & Plan Note (Signed)
Using oxygen only on an as-needed basis during an exacerbation. Otherwise she is borderline

## 2023-03-30 NOTE — Patient Instructions (Addendum)
  Lung function is at 49%  Lung cancer screening CT scan   X Zpak Prednisone 10 mg tabs Take 4 tabs  daily with food x 4 days, then 3 tabs daily x 4 days, then 2 tabs daily x 4 days, then 1 tab daily x4 days then stop. #40

## 2023-03-30 NOTE — Assessment & Plan Note (Signed)
Continue Trelegy daily and use albuterol as needed. We discussed signs and symptoms of COPD exacerbation and plan for the same. Will keep prescription for Z-Pak and prednisone pending in her pharmacy should she have an exacerbation and she can self administer

## 2023-03-30 NOTE — Progress Notes (Signed)
   Subjective:    Patient ID: Brittany Miranda, female    DOB: 1951/04/16, 72 y.o.   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 She quit smoking 1999, more than 30 pack years 04/2022 She needed nocturnal oxygen transiently after hospital visit for an exacerbation    -completed alcohol rehab program and  weaned herself off Xanax  Chief Complaint  Patient presents with   Follow-up   She required Z-Pak and prednisone on 2 occasions 01/03/2023 and 03/04/2023 when she called 1 time she called on a Thursday and did not get her medications till Monday and this almost prompted an ED visit. She is back to her baseline now.  She is compliant with Trelegy, uses albuterol only when she has a flareup oxygen saturation is holding, 96% today she does have oxygen but only uses this on an as-needed basis  Significant tests/ events reviewed  03/2021 >On ambulation heart rate increased from 80-1 03 and oxygen saturation dropped from 96 to 91% on 2 laps   PFTs 03/2021 ratio 44, FEV1 49%, DLCO 30%, severe airway obstruction   Review of Systems neg for any significant sore throat, dysphagia, itching, sneezing, nasal congestion or excess/ purulent secretions, fever, chills, sweats, unintended wt loss, pleuritic or exertional cp, hempoptysis, orthopnea pnd or change in chronic leg swelling. Also denies presyncope, palpitations, heartburn, abdominal pain, nausea, vomiting, diarrhea or change in bowel or urinary habits, dysuria,hematuria, rash, arthralgias, visual complaints, headache, numbness weakness or ataxia.     Objective:   Physical Exam  Gen. Pleasant, thin petite, in no distress ENT - no thrush, no pallor/icterus,no post nasal drip Neck: No JVD, no thyromegaly, no carotid bruits Lungs: no use of accessory muscles, no dullness to percussion, clear without rales or rhonchi  Cardiovascular: Rhythm regular, heart sounds  normal, no murmurs or gallops, no peripheral edema Musculoskeletal:  No deformities, no cyanosis or clubbing         Assessment & Plan:

## 2023-05-16 ENCOUNTER — Other Ambulatory Visit (HOSPITAL_COMMUNITY): Payer: Self-pay | Admitting: Family Medicine

## 2023-05-16 DIAGNOSIS — Z1231 Encounter for screening mammogram for malignant neoplasm of breast: Secondary | ICD-10-CM

## 2023-05-22 ENCOUNTER — Ambulatory Visit (HOSPITAL_COMMUNITY): Payer: Medicare HMO

## 2023-06-14 ENCOUNTER — Ambulatory Visit (HOSPITAL_COMMUNITY)
Admission: RE | Admit: 2023-06-14 | Discharge: 2023-06-14 | Disposition: A | Discharge status: Home / Self Care | Payer: Medicare HMO | Source: Ambulatory Visit | Attending: Family Medicine | Admitting: Family Medicine

## 2023-06-14 DIAGNOSIS — Z1231 Encounter for screening mammogram for malignant neoplasm of breast: Secondary | ICD-10-CM | POA: Diagnosis present

## 2023-08-18 ENCOUNTER — Ambulatory Visit
Admission: EM | Admit: 2023-08-18 | Discharge: 2023-08-18 | Disposition: A | Discharge status: Home / Self Care | Payer: Medicare HMO | Attending: Family Medicine | Admitting: Family Medicine

## 2023-08-18 DIAGNOSIS — S61213A Laceration without foreign body of left middle finger without damage to nail, initial encounter: Secondary | ICD-10-CM

## 2023-08-18 MED ORDER — MUPIROCIN 2 % EX OINT: 1.0000 | TOPICAL_OINTMENT | Freq: Two times a day (BID) | CUTANEOUS | 0 refills | Status: AC

## 2023-08-18 MED ORDER — CHLORHEXIDINE GLUCONATE 4 % EX SOLN: Freq: Every day | CUTANEOUS | 0 refills | Status: DC | PRN

## 2023-08-18 MED ORDER — LIDOCAINE-EPINEPHRINE-TETRACAINE (LET) TOPICAL GEL
3.0000 mL | Freq: Once | TOPICAL | Status: AC
  Administered 2023-08-18: 3 mL via TOPICAL

## 2023-08-18 NOTE — ED Provider Notes (Signed)
[facility]    CSN: [id number] Arrival date & time: 08/18/23  1704      History   Chief Complaint Chief Complaint  Patient presents with   Laceration    HPI Brittany Miranda is a 72 y.o. female.   Presenting today with a laceration to the left middle fingertip that occurred today while she was cutting a pepper.  She states she was not able to get the bleeding to stop at home despite pressure dressings.  Denies decreased range of motion, numbness, tingling.  Not on any anticoagulation.  Last tetanus 2019.    Past Medical History:  Diagnosis Date   Anxiety    Colon polyps    adenomas in remote past    Complication of anesthesia    COPD (chronic obstructive pulmonary disease) (HCC)    Depression    HTN (hypertension)    Osteopenia     Patient Active Problem List   Diagnosis Date Noted   Tobacco abuse 03/30/2023   Constipation 03/02/2022   Hypertension 10/25/2021   History of colonic polyps 10/25/2021   Anxiety 08/24/2018   Chronic respiratory failure with hypoxia (HCC) 08/24/2018   COPD with chronic bronchitis and emphysema (HCC) 08/23/2018   Depression with anxiety 08/23/2018   Colon polyps 04/05/2011    Past Surgical History:  Procedure Laterality Date   APPENDECTOMY     COLONOSCOPY  11/2005   Dr. Claudius Sis diverticula   COLONOSCOPY  2000   [facility]-->per patient colon polyps, adenomatous   COLONOSCOPY N/A 07/08/2016   Surgeon: Corbin Ade, MD; colonic diverticulosis, one 5 mm tubular adenoma, repeat in 5 years.   COLONOSCOPY WITH PROPOFOL N/A 07/13/2022   Surgeon: Corbin Ade, MD; Pancolonic diverticulosis, melanosis coli, mildly redundant colon.  Recommended repeat colonoscopy in 7 years for surveillance if overall health permits.   IRRIGATION AND DEBRIDEMENT OF WOUND WITH SPLIT THICKNESS SKIN GRAFT Left 08/13/2018   Procedure: IRRIGATION AND DEBRIDEMENT OF LEFT LEG WOUND WITH SPLIT THICKNESS SKIN GRAFT;  Surgeon: Louisa Second, MD;  Location: [facility];  Service: Plastics;  Laterality: Left;   SKIN SPLIT GRAFT Left 08/13/2018   Procedure: SKIN GRAFT SPLIT THICKNESS;  Surgeon: Louisa Second, MD;  Location: [facility];  Service: Plastics;  Laterality: Left;    OB History   No obstetric history on file.      Home Medications    Prior to Admission medications   Medication Sig Start Date End Date Taking? Authorizing Provider  chlorhexidine (HIBICLENS) 4 % external liquid Apply topically daily as needed. 08/18/23  Yes Particia Nearing, PA-C  mupirocin ointment (BACTROBAN) 2 % Apply 1 Application topically 2 (two) times daily. 08/18/23  Yes Particia Nearing, PA-C  albuterol (PROVENTIL HFA;VENTOLIN HFA) 108 (90 Base) MCG/ACT inhaler Inhale 1 puff into the lungs every 6 (six) hours as needed for wheezing or shortness of breath. 05/08/18   [provider]  augmented betamethasone dipropionate (DIPROLENE-AF) 0.05 % cream Apply 1 application  topically 2 (two) times daily as needed (eczema). 06/17/21   [provider]  Calcium Carbonate-Vitamin D (CALTRATE 600+D PO) Take 600 mg by mouth daily.    [provider]  DULoxetine (CYMBALTA) 60 MG capsule Take 60 mg by mouth daily. 07/15/21   [provider]  ibuprofen (ADVIL) 200 MG tablet Take 400 mg by mouth every 6 (six) hours as needed for moderate pain.    [provider]  LINZESS 145 MCG CAPS capsule  TAKE ONE CAPSULE ( TOTAL) BY MOUTHDAILY BEFORE BREAKFAST 09/07/22   Letta Median, PA-C  losartan (COZAAR) 25 MG tablet Take 25 mg by mouth at bedtime. Patient not taking: Reported on 03/30/2023 01/29/22   [provider]  Multiple Vitamin (MULTIVITAMIN WITH MINERALS) TABS tablet Take 1 tablet by mouth daily.    [provider]  Na Sulfate-K Sulfate-Mg Sulf 17.5-3.13-1.6 GM/177ML SOLN As directed 06/02/22   Rourk, Gerrit Friends, MD  naltrexone (DEPADE) 50 MG tablet  Take 50 mg by mouth at bedtime. 02/15/22   [provider]  Omega-3 Fatty Acids (FISH OIL) 1000 MG CAPS Take 2,400 mg by mouth daily.    [provider]  traZODone (DESYREL) 50 MG tablet Take 50 mg by mouth at bedtime. 02/15/22   [provider]  TRELEGY ELLIPTA 100-62.5-25 MCG/ACT AEPB Inhale 1 puff into the lungs daily. 09/25/21   [provider]    Family History Family History  Problem Relation Age of Onset   Kidney disease Mother    Kidney disease Father    Liver disease Neg Hx    Colon cancer Neg Hx     Social History Social History   Tobacco Use   Smoking status: Former    Current packs/day: 0.00    Average packs/day: 1 pack/day for 30.0 years (30.0 ttl pk-yrs)    Types: Cigarettes    Start date: 04/04/1968    Quit date: 04/04/1998    Years since quitting: 25.3   Smokeless tobacco: Never  Vaping Use   Vaping status: Never Used  Substance Use Topics   Alcohol use: Not Currently    Comment: Abstinent since March 2023.  Reports she was drinking "too much" and went to rehab.   Drug use: No     Allergies   Patient has no known allergies.   Review of Systems Review of Systems PER HPI  Physical Exam Triage Vital Signs ED Triage Vitals  Encounter Vitals Group     BP 08/18/23 1733 103/66     Systolic BP Percentile --      Diastolic BP Percentile --      Pulse Rate 08/18/23 1733 77     Resp 08/18/23 1733 18     Temp 08/18/23 1733 99 F (37.2 C)     Temp Source 08/18/23 1733 Oral     SpO2 08/18/23 1733 92 %     Weight --      Height --      Head Circumference --      Peak Flow --      Pain Score 08/18/23 1735 2     Pain Loc --      Pain Education --      Exclude from Growth Chart --    No data found.  Updated Vital Signs BP 103/66 (BP Location: Right Arm)   Pulse 77   Temp 99 F (37.2 C) (Oral)   Resp 18   SpO2 92%   Visual Acuity Right Eye Distance:   Left Eye Distance:   Bilateral Distance:    Right Eye  Near:   Left Eye Near:    Bilateral Near:     Physical Exam Vitals and nursing note reviewed.  Constitutional:      Appearance: Normal appearance. She is not ill-appearing.  HENT:     Head: Atraumatic.  Eyes:     Extraocular Movements: Extraocular movements intact.     Conjunctiva/sclera: Conjunctivae normal.  Cardiovascular:  Rate and Rhythm: Normal rate and regular rhythm.     Heart sounds: Normal heart sounds.  Pulmonary:     Effort: Pulmonary effort is normal.     Breath sounds: Normal breath sounds.  Musculoskeletal:        General: Tenderness and signs of injury present. Normal range of motion.     Cervical back: Normal range of motion and neck supple.  Skin:    General: Skin is warm.     Comments: Small superficial avulsion to distal left index finger, bleeding slightly  Neurological:     Mental Status: She is alert and oriented to person, place, and time.     Comments: Left hand neurovascularly Intact  Psychiatric:        Mood and Affect: Mood normal.        Thought Content: Thought content normal.        Judgment: Judgment normal.      UC Treatments / Results  Labs (all labs ordered are listed, but only abnormal results are displayed) Labs Reviewed - No data to display  EKG   Radiology No results found.  Procedures Procedures (including critical care time)  Medications Ordered in UC Medications  lidocaine-EPINEPHrine-tetracaine (LET) topical gel (3 mLs Topical Given 08/18/23 1802)    Initial Impression / Assessment and Plan / UC Course  I have reviewed the triage vital signs and the nursing notes.  Pertinent labs & imaging results that were available during my care of the patient were reviewed by me and considered in my medical decision making (see chart for details).     Area was cleaned well, let gel applied to halt bleeding and for pain relief and pressure dressing placed.  Clean with Hibiclens, apply mupirocin and keep dressed until fully  healed.  Return precautions reviewed.  Tetanus last given in 2019, declines updating today.  Final Clinical Impressions(s) / UC Diagnoses   Final diagnoses:  Laceration of left middle finger without foreign body without damage to nail, initial encounter     Discharge Instructions      Clean the area at least once a day with Hibiclens and apply the mupirocin ointment.  Keep covered with a nonstick dressing until fully healed.    ED Prescriptions     Medication Sig Dispense Auth. Provider   chlorhexidine (HIBICLENS) 4 % external liquid Apply topically daily as needed. 236 mL Particia Nearing, PA-C   mupirocin ointment (BACTROBAN) 2 % Apply 1 Application topically 2 (two) times daily. 22 g Particia Nearing, [place]      PDMP not reviewed this encounter.   Particia Nearing, [place] 08/18/23 1815

## 2023-08-18 NOTE — ED Notes (Signed)
Pt is soaking in Hibiclens and warm water

## 2023-08-18 NOTE — Discharge Instructions (Signed)
Clean the area at least once a day with Hibiclens and apply the mupirocin ointment.  Keep covered with a nonstick dressing until fully healed.

## 2023-08-18 NOTE — ED Triage Notes (Signed)
Pt pt reports she was cutting a green pepper and sliced her left middle finger.    Last tetanus 2019

## 2023-09-22 ENCOUNTER — Other Ambulatory Visit: Payer: Self-pay | Admitting: Gastroenterology

## 2023-09-22 DIAGNOSIS — K5904 Chronic idiopathic constipation: Secondary | ICD-10-CM

## 2023-10-23 ENCOUNTER — Encounter (HOSPITAL_BASED_OUTPATIENT_CLINIC_OR_DEPARTMENT_OTHER): Payer: Self-pay

## 2023-10-23 ENCOUNTER — Encounter (HOSPITAL_BASED_OUTPATIENT_CLINIC_OR_DEPARTMENT_OTHER): Payer: Self-pay | Admitting: Pulmonary Disease

## 2023-10-23 ENCOUNTER — Ambulatory Visit (HOSPITAL_BASED_OUTPATIENT_CLINIC_OR_DEPARTMENT_OTHER): Payer: Medicare HMO

## 2023-10-23 ENCOUNTER — Other Ambulatory Visit (HOSPITAL_BASED_OUTPATIENT_CLINIC_OR_DEPARTMENT_OTHER): Payer: Self-pay

## 2023-10-23 ENCOUNTER — Ambulatory Visit (HOSPITAL_BASED_OUTPATIENT_CLINIC_OR_DEPARTMENT_OTHER): Payer: Medicare HMO | Admitting: Pulmonary Disease

## 2023-10-23 VITALS — BP 120/78 | HR 73 | Resp 16 | Ht 63.0 in | Wt 90.7 lb

## 2023-10-23 DIAGNOSIS — J9611 Chronic respiratory failure with hypoxia: Secondary | ICD-10-CM | POA: Diagnosis not present

## 2023-10-23 DIAGNOSIS — Z720 Tobacco use: Secondary | ICD-10-CM

## 2023-10-23 DIAGNOSIS — J4489 Other specified chronic obstructive pulmonary disease: Secondary | ICD-10-CM

## 2023-10-23 DIAGNOSIS — J439 Emphysema, unspecified: Secondary | ICD-10-CM

## 2023-10-23 NOTE — Assessment & Plan Note (Addendum)
She did not qualify for lung cancer screening program.  obtain chest x-ray

## 2023-10-23 NOTE — Assessment & Plan Note (Addendum)
Brittany Miranda has done well and learn to cope with her respiratory limitations.  She had an interim flare but was able to use Z-Pak and prednisone and get through it.  We will refill her Z-Pak and prednisone. She will continue on Trelegy. We discussed COPD action plan and signs and symptoms of COPD exacerbation. Her vaccinations are up-to-date

## 2023-10-23 NOTE — Assessment & Plan Note (Signed)
She has oxygen at home which she will use on an as-needed basis

## 2023-10-23 NOTE — Patient Instructions (Addendum)
X CXR today for screening  X refills on zpak & prednisone & trelegy

## 2023-10-23 NOTE — Progress Notes (Signed)
   Subjective:    Patient ID: Brittany Miranda, female    DOB: February 01, 1951, 72 y.o.   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 She quit smoking 1999, more than 30 pack years 04/2022 She needed nocturnal oxygen transiently after hospital visit for an exacerbation    -completed alcohol rehab program and  weaned herself off Xanax  Discussed the use of AI scribe software for clinical note transcription with the patient, who gave verbal consent to proceed.  History of Present Illness   [name], a patient with a history of lung disease, presents for a routine follow-up. She reports that she had a problem in the summer, for which she used the prescribed medication, including an antibiotic and prednisone, which resolved her symptoms. She requests a refill of these medications for future use. She has been managing her lung disease with Trelegy inhaler and may need a refill soon. She has not been using the home oxygen that she has from a previous hospital discharge, but wishes to keep it for potential use in the winter. She has not had any other health issues since her last visit.      Significant tests/ events reviewed   03/2021 >On ambulation heart rate increased from 80-1 03 and oxygen saturation dropped from 96 to 91% on 2 laps   PFTs 03/2021 ratio 44, FEV1 49%, DLCO 30%, severe airway obstruction    Review of Systems neg for any significant sore throat, dysphagia, itching, sneezing, nasal congestion or excess/ purulent secretions, fever, chills, sweats, unintended wt loss, pleuritic or exertional cp, hempoptysis, orthopnea pnd or change in chronic leg swelling. Also denies presyncope, palpitations, heartburn, abdominal pain, nausea, vomiting, diarrhea or change in bowel or urinary habits, dysuria,hematuria, rash, arthralgias, visual complaints, headache, numbness weakness or ataxia.     Objective:   Physical Exam  Gen. Pleasant, thin woman, in no distress ENT - no thrush,  no pallor/icterus,no post nasal drip Neck: No JVD, no thyromegaly, no carotid bruits Lungs: no use of accessory muscles, no dullness to percussion, clear without rales or rhonchi  Cardiovascular: Rhythm regular, heart sounds  normal, no murmurs or gallops, no peripheral edema Musculoskeletal: No deformities, no cyanosis or clubbing        Assessment & Plan:    Assessment and Plan    Chronic Obstructive Pulmonary Disease (COPD) COPD managed with Trelegy inhaler. Recent exacerbation treated with Z-Pak and prednisone. Currently well-managed, no recent home oxygen use. Borderline lung function; may need oxygen during severe respiratory infections. Decided to keep home oxygen for potential winter use. - Refill Z-Pak and prednisone - Refill Trelegy inhaler - Order chest x-ray - Advise use of home oxygen during respiratory infections if needed  Lung Cancer Screening Not eligible for CT screening due to smoking cessation 25 years ago. Discussed chest x-ray as an alternative, noting it is less effective than CT. - Order chest x-ray for screening  General Health Maintenance Up-to-date on flu and COVID vaccinations. Received RSV vaccine last year, effective for two years. - No additional RSV vaccination needed this year  Follow-up - Schedule follow-up visit in six months - Instruct to contact clinic if experiencing respiratory issues.

## 2023-10-24 ENCOUNTER — Encounter (HOSPITAL_BASED_OUTPATIENT_CLINIC_OR_DEPARTMENT_OTHER): Payer: Self-pay

## 2023-10-24 ENCOUNTER — Telehealth: Payer: Self-pay | Admitting: Pulmonary Disease

## 2023-10-24 MED ORDER — PREDNISONE 10 MG PO TABS
ORAL_TABLET | ORAL | 0 refills | Status: DC
Start: 2023-10-24 — End: 2024-08-20

## 2023-10-24 MED ORDER — TRELEGY ELLIPTA 100-62.5-25 MCG/ACT IN AEPB: 1.0000 | INHALATION_SPRAY | Freq: Every day | RESPIRATORY_TRACT | 1 refills | Status: DC

## 2023-10-24 MED ORDER — AZITHROMYCIN 250 MG PO TABS: ORAL_TABLET | ORAL | 0 refills | Status: DC

## 2023-10-24 NOTE — Telephone Encounter (Signed)
Pt prednisone or antibiotic has nit been sent in

## 2023-11-23 ENCOUNTER — Other Ambulatory Visit (HOSPITAL_COMMUNITY): Payer: Self-pay | Admitting: Family Medicine

## 2023-11-23 DIAGNOSIS — Z0001 Encounter for general adult medical examination with abnormal findings: Secondary | ICD-10-CM

## 2023-11-29 ENCOUNTER — Ambulatory Visit (HOSPITAL_COMMUNITY)
Admission: RE | Admit: 2023-11-29 | Discharge: 2023-11-29 | Disposition: A | Discharge status: Home / Self Care | Payer: Medicare HMO | Source: Ambulatory Visit | Attending: Family Medicine | Admitting: Family Medicine

## 2023-11-29 DIAGNOSIS — Z0001 Encounter for general adult medical examination with abnormal findings: Secondary | ICD-10-CM

## 2023-11-29 DIAGNOSIS — Z780 Asymptomatic menopausal state: Secondary | ICD-10-CM | POA: Insufficient documentation

## 2023-11-29 DIAGNOSIS — M810 Age-related osteoporosis without current pathological fracture: Secondary | ICD-10-CM | POA: Diagnosis not present

## 2023-11-29 DIAGNOSIS — Z13820 Encounter for screening for osteoporosis: Secondary | ICD-10-CM | POA: Diagnosis not present

## 2024-02-07 ENCOUNTER — Other Ambulatory Visit (HOSPITAL_BASED_OUTPATIENT_CLINIC_OR_DEPARTMENT_OTHER): Payer: Self-pay | Admitting: Pulmonary Disease

## 2024-07-03 ENCOUNTER — Other Ambulatory Visit (HOSPITAL_COMMUNITY): Payer: Self-pay | Admitting: Family Medicine

## 2024-07-03 DIAGNOSIS — Z1231 Encounter for screening mammogram for malignant neoplasm of breast: Secondary | ICD-10-CM

## 2024-07-10 ENCOUNTER — Ambulatory Visit (HOSPITAL_COMMUNITY)
Admission: RE | Admit: 2024-07-10 | Discharge: 2024-07-10 | Disposition: A | Discharge status: Home / Self Care | Source: Ambulatory Visit | Attending: Family Medicine | Admitting: Family Medicine

## 2024-07-10 DIAGNOSIS — Z1231 Encounter for screening mammogram for malignant neoplasm of breast: Secondary | ICD-10-CM | POA: Diagnosis present

## 2024-07-17 ENCOUNTER — Other Ambulatory Visit (HOSPITAL_COMMUNITY): Payer: Self-pay | Admitting: Family Medicine

## 2024-07-17 DIAGNOSIS — R928 Other abnormal and inconclusive findings on diagnostic imaging of breast: Secondary | ICD-10-CM

## 2024-07-25 ENCOUNTER — Encounter (HOSPITAL_COMMUNITY): Payer: Self-pay

## 2024-07-25 ENCOUNTER — Ambulatory Visit (HOSPITAL_COMMUNITY)
Admission: RE | Admit: 2024-07-25 | Discharge: 2024-07-25 | Disposition: A | Discharge status: Home / Self Care | Source: Ambulatory Visit | Attending: Family Medicine | Admitting: Family Medicine

## 2024-07-25 DIAGNOSIS — R928 Other abnormal and inconclusive findings on diagnostic imaging of breast: Secondary | ICD-10-CM

## 2024-08-20 ENCOUNTER — Ambulatory Visit (HOSPITAL_BASED_OUTPATIENT_CLINIC_OR_DEPARTMENT_OTHER): Admitting: Pulmonary Disease

## 2024-08-20 ENCOUNTER — Ambulatory Visit (HOSPITAL_BASED_OUTPATIENT_CLINIC_OR_DEPARTMENT_OTHER): Payer: Self-pay | Admitting: Pulmonary Disease

## 2024-08-20 ENCOUNTER — Encounter (HOSPITAL_BASED_OUTPATIENT_CLINIC_OR_DEPARTMENT_OTHER): Payer: Self-pay | Admitting: Pulmonary Disease

## 2024-08-20 VITALS — BP 128/81 | HR 77 | Ht 63.0 in | Wt 89.1 lb

## 2024-08-20 DIAGNOSIS — J441 Chronic obstructive pulmonary disease with (acute) exacerbation: Secondary | ICD-10-CM

## 2024-08-20 MED ORDER — PREDNISONE 10 MG PO TABS: ORAL_TABLET | ORAL | 0 refills | Status: DC

## 2024-08-20 MED ORDER — METHYLPREDNISOLONE ACETATE 80 MG/ML IJ SUSP
80.0000 mg | Freq: Once | INTRAMUSCULAR | Status: AC
  Administered 2024-08-20: 80 mg via INTRAMUSCULAR

## 2024-08-20 MED ORDER — AZITHROMYCIN 250 MG PO TABS: ORAL_TABLET | ORAL | 0 refills | Status: DC

## 2024-08-20 MED ORDER — ALBUTEROL SULFATE 1.25 MG/3ML IN NEBU: 1.0000 | INHALATION_SOLUTION | Freq: Four times a day (QID) | RESPIRATORY_TRACT | 12 refills | Status: AC | PRN

## 2024-08-20 MED ORDER — TRELEGY ELLIPTA 100-62.5-25 MCG/ACT IN AEPB: 1.0000 | INHALATION_SPRAY | Freq: Every day | RESPIRATORY_TRACT | 9 refills | Status: AC

## 2024-08-20 NOTE — Patient Instructions (Signed)
  VISIT SUMMARY: Brittany Miranda, a 73 year old female with COPD, visited today due to a cough and shortness of breath. She recently experienced breathing difficulties following general anesthesia for a dog bite and was referred by the hospital. She developed a cough two days ago, preceded by a scratchy sore throat, and has a history of bronchitis exacerbations. Her last hospitalization for breathing problems was in 2019. She uses Trelegy and a rescue inhaler, and has a nebulizer machine but has not used it in years.  YOUR PLAN: -CHRONIC OBSTRUCTIVE PULMONARY DISEASE WITH ACUTE EXACERBATION: COPD is a chronic lung disease that makes it hard to breathe. You are experiencing an exacerbation, which means your symptoms have suddenly worsened. Today, you received a Medrol  injection to help reduce inflammation in your lungs. You were also prescribed azithromycin  (Z-Pak) to treat any potential bacterial infection. Please use your nebulizer treatments 2-3 times daily for one week and then stop once your symptoms improve. Your prescriptions for prednisone  and Trelegy have been refilled. Make sure to check the expiration date on your current prednisone  and discard it if it is expired.  INSTRUCTIONS: Please follow up with us  if your symptoms do not improve or if they worsen. Use your nebulizer treatments as instructed and take all prescribed medications. If you have any questions or concerns, do not hesitate to contact our office.   Contains text generated by [employer]bridge.                                 Contains text generated by [employer]bridge.

## 2024-08-20 NOTE — Telephone Encounter (Signed)
 FYI Only or Action Required?: FYI only for provider.  Patient is followed in Pulmonology for COPD, last seen on 10/23/2023 by Jude Harden GAILS, MD.  Called Nurse Triage reporting Cough.  Symptoms began 2 days ago.  Interventions attempted: Rescue inhaler and Maintenance inhaler.  Symptoms are: gradually worsening.  Triage Disposition: See HCP Within 4 Hours (Or PCP Triage)  Patient/caregiver understands and will follow disposition?: Yes                             Copied from CRM #[id number]. Topic: Clinical - Red Word Triage >> Aug 20, 2024 10:46 AM Nathanel DEL wrote: Red Word that prompted transfer to Nurse Triage: pt has wheezing. Always SOB.  But she also has raspy deep cough.  Coughing up  Yellow thick mucus. She said Dr Jude told her if happened to call and he would call in prednisone  and abx. She thinks it may be bronchitis Reason for Disposition  Wheezing is present  Answer Assessment - Initial Assessment Questions E2C2 Pulmonary Triage - Initial Assessment Questions  1. Chief Complaint (e.g., cough, sob, wheezing, fever, chills, sweat or additional symptoms) *Go to specific symptom protocol after initial questions. Productive cough   2. Have you tested for COVID or Flu? Note: If not, ask patient if a home test can be taken. If so, instruct patient to call back for positive results. COVID test was negative   3. Have you used any OTC meds to help with symptoms? If yes, ask What medications? Denies   4. Have you used your inhalers/maintenance medication? If yes, What medications? Trelegy and Advair, not using more than normal  5. Do you wear supplemental oxygen ? If yes, How many liters are you supposed to use? States she has it, but has not used in a long time   6. Do you monitor your oxygen  levels? If yes, What is your reading (oxygen  level) today? 95% while on phone with this RN   7. What is your usual oxygen  saturation  reading?  (Note: Pulmonary O2 sats should be 90% or greater) 90-95%  1. ONSET: When did the cough begin?      2 days ago 2. SEVERITY: How bad is the cough today?      Frequent coughing spells that worsen with talking 3. SPUTUM: Describe the color of your sputum (e.g., none, dry cough; clear, white, yellow, green)     Yellow 4. HEMOPTYSIS: Are you coughing up any blood? If Yes, ask: How much? (e.g., flecks, streaks, tablespoons, etc.)     Denies 5. DIFFICULTY BREATHING: Are you having difficulty breathing? If Yes, ask: How bad is it? (e.g., mild, moderate, severe)      SOB at baseline, patient able to speak in clear and complete sentences while on phone with this RN, denies worsening SOB 6. FEVER: Do you have a fever? If Yes, ask: What is your temperature, how was it measured, and when did it start?     Maybe low grade fever 8. LUNG HISTORY: Do you have any history of lung disease?  (e.g., pulmonary embolus, asthma, emphysema)     COPD 10. OTHER SYMPTOMS: Do you have any other symptoms? (e.g., runny nose, wheezing, chest pain)     Wheezing, nasal drainage, scratchy throat 11. PREGNANCY: Is there any chance you are pregnant? When was your last menstrual period?     N/A  Protocols used: Cough - Acute Productive-A-AH

## 2024-08-20 NOTE — Progress Notes (Signed)
 Established Patient Pulmonology Office Visit   Subjective:  Patient ID: Brittany Miranda, female    DOB: 07-Jul-1951  MRN: [id number]  CC:  Chief Complaint  Patient presents with   COPD    COPD follow-up, Cough that started x 2 days    HPI  Brittany Miranda is a 73 y/o F with a PMH significant for COPD (FEV1 1.1 L, 49% predicted) who presents for an acute visit.  Discussed the use of AI scribe software for clinical note transcription with the patient, who gave verbal consent to proceed.  History of Present Illness Brittany Miranda is a 73 year old female with COPD who presents with a cough and shortness of breath.  She was diagnosed with COPD ten years ago and has been under care since her hospitalization for breathing difficulties following general anesthesia for a dog bite. Her breathing has not been significantly bothersome recently, although she experienced some shortness of breath while walking her dog uphill this morning.  She developed a cough two days ago, preceded by a scratchy sore throat. She experiences wheezing and reports bronchitis exacerbations approximately every six to twelve months, typically requiring prednisone  and antibiotics. Her last hospitalization for breathing problems was in 2019.  She uses Trelegy, one puff in the morning, and has a rescue inhaler which she rarely uses. She also has a nebulizer machine but has not used it in years and does not have the solution for it.  Her past medical history includes a previous dog bite. She was bitten by another dog recently, which was less severe and did not require surgery, but she used Z pack previously prescribed by Dr. Jude at the advice of a friend.  She quit smoking over 25 years ago after smoking for 30 years, starting at age 61 and quitting at 54. She smoked up to a pack a day at her peak.  Her current medications include Linzess , Celebrex, Cymbalta, losartan, and trazodone. No known allergies to medications.  CAT >  10 MMRC > 2 Exacerbations: outpatient, twice annually  ROS    Current Outpatient Medications:    albuterol  (ACCUNEB ) 1.25 MG/3ML nebulizer solution, Take 3 mLs (1.25 mg total) by nebulization every 6 (six) hours as needed for wheezing or shortness of breath., Disp: 75 mL, Rfl: 12   albuterol  (PROVENTIL  HFA;VENTOLIN  HFA) 108 (90 Base) MCG/ACT inhaler, Inhale 1 puff into the lungs every 6 (six) hours as needed for wheezing or shortness of breath., Disp: , Rfl:    augmented betamethasone dipropionate (DIPROLENE-AF) 0.05 % cream, Apply 1 application  topically 2 (two) times daily as needed (eczema)., Disp: , Rfl:    Calcium  Carbonate-Vitamin D (CALTRATE 600+D PO), Take 600 mg by mouth daily., Disp: , Rfl:    DULoxetine (CYMBALTA) 60 MG capsule, Take 60 mg by mouth daily., Disp: , Rfl:    ibuprofen  (ADVIL ) 200 MG tablet, Take 400 mg by mouth every 6 (six) hours as needed for moderate pain., Disp: , Rfl:    LINZESS  145 MCG CAPS capsule, TAKE ONE CAPSULE ( TOTAL) BY MOUTHDAILY BEFORE BREAKFAST, Disp: 30 capsule, Rfl: 11   losartan (COZAAR) 25 MG tablet, Take 25 mg by mouth at bedtime., Disp: , Rfl:    Multiple Vitamin (MULTIVITAMIN WITH MINERALS) TABS tablet, Take 1 tablet by mouth daily., Disp: , Rfl:    Omega-3 Fatty Acids (FISH OIL) 1000 MG CAPS, Take 2,400 mg by mouth daily., Disp: , Rfl:    traZODone (DESYREL) 50 MG tablet, Take 50  mg by mouth at bedtime., Disp: , Rfl:    azithromycin  (ZITHROMAX ) 250 MG tablet, a dose of 500 mg (2 tablets of 250 mg) the first day and 250 mg for the remaining four days, Disp: 6 tablet, Rfl: 0   mupirocin  ointment (BACTROBAN ) 2 %, Apply 1 Application topically 2 (two) times daily. (Patient not taking: Reported on 08/20/2024), Disp: 22 g, Rfl: 0   naltrexone (DEPADE) 50 MG tablet, Take 50 mg by mouth at bedtime. (Patient not taking: Reported on 08/20/2024), Disp: , Rfl:    predniSONE  (DELTASONE ) 10 MG tablet, Take 4 tablets (40 mg total) by mouth daily with  breakfast for 4 days, THEN 3 tablets (30 mg total) daily with breakfast for 4 days, THEN 2 tablets (20 mg total) daily with breakfast for 4 days, THEN 1 tablet (10 mg total) daily with breakfast for 4 days., Disp: 40 tablet, Rfl: 0   TRELEGY ELLIPTA  100-62.5-25 MCG/ACT AEPB, Inhale 1 Inhalation into the lungs daily., Disp: 60 each, Rfl: 9  Current Facility-Administered Medications:    methylPREDNISolone  acetate (DEPO-MEDROL ) injection 80 mg, 80 mg, Intramuscular, Once,       Objective:  BP 128/81   Pulse 77   Ht 5' 3 (1.6 m)   Wt 89 lb 1.6 oz (40.4 kg)   SpO2 93%   BMI 15.78 kg/m  Wt Readings from Last 3 Encounters:  08/20/24 89 lb 1.6 oz (40.4 kg)  10/23/23 90 lb 11.2 oz (41.1 kg)  03/30/23 92 lb 9.6 oz (42 kg)   BMI Readings from Last 3 Encounters:  08/20/24 15.78 kg/m  10/23/23 16.07 kg/m  03/30/23 16.40 kg/m   SpO2 Readings from Last 3 Encounters:  08/20/24 93%  10/23/23 98%  08/18/23 92%    Physical Exam General: NAD, alert, WD, WN Eyes: PERRL, no scleral icterus ENMT: oropharynx clear, good dentition, no oral lesions, mallampati score I Skin: warm, intact, no rashes Neck: JVD flat, ROM and lymph node assessment normal CV: RRR, no MRG, nl S1 and S2, no peripheral edema Resp: diffuse expiratory wheezing and coughing on deep breathing, no clubbing. Abdom: Normoactive bowel sounds, soft, nontender, nondistended, no hepatosplenomegaly Neuro: Awake alert oriented to person place time and situation  Diagnostic Review:  Last CBC Lab Results  Component Value Date   WBC 5.0 08/23/2018   HGB 13.2 08/23/2018   HCT 41.0 08/23/2018   MCV 105.4 (H) 08/23/2018   MCH 33.9 08/23/2018   RDW 13.1 08/23/2018   PLT 218 08/23/2018   Last metabolic panel Lab Results  Component Value Date   GLUCOSE 96 08/23/2018   NA 134 (L) 08/23/2018   K 4.0 08/23/2018   CL 98 08/23/2018   CO2 25 08/23/2018   BUN 7 (L) 08/23/2018   CREATININE 0.58 08/23/2018   GFRNONAA >60  08/23/2018   CALCIUM  8.9 08/23/2018   PROT 7.0 08/23/2018   ALBUMIN 3.6 08/23/2018   BILITOT 0.8 08/23/2018   ALKPHOS 77 08/23/2018   AST 33 08/23/2018   ALT 20 08/23/2018   ANIONGAP 11 08/23/2018    CXR 10/2023: hyperinflation.    Assessment & Plan:   Assessment & Plan COPD with acute exacerbation (HCC) Grade III, Class B. Will continue ICS/LAMA/LABA with Trelegy 100. Rest of the plan per below.  Assessment & Plan Chronic obstructive pulmonary disease with acute exacerbation COPD exacerbation with increased cough and wheezing. Managed with prednisone  and antibiotics. No hospitalizations since 2019. Current medications include Trelegy and a rarely used rescue inhaler. Former smoker, quit over  25 years ago. - Administered Medrol  injection in clinic. - Prescribed azithromycin  (Z-Pak). - Instructed to use nebulizer treatments 2-3 times daily for one week, then discontinue once symptoms improve. - Start prednisone  taper - Continue Trelegy 1 inhalation daily  I spent 20 minutes reviewing patient's chart including prior consultant notes, imaging, and PFTs as well as face-to-face with the patient, over half in discussion of the diagnosis and the importance of compliance with the treatment plan.  Return in about 3 months (around 11/20/2024).   Imir Brumbach, MD

## 2024-08-23 ENCOUNTER — Ambulatory Visit (HOSPITAL_BASED_OUTPATIENT_CLINIC_OR_DEPARTMENT_OTHER): Admitting: Pulmonary Disease

## 2024-08-27 ENCOUNTER — Ambulatory Visit (HOSPITAL_BASED_OUTPATIENT_CLINIC_OR_DEPARTMENT_OTHER): Payer: Self-pay | Admitting: Pulmonary Disease

## 2024-08-27 DIAGNOSIS — J189 Pneumonia, unspecified organism: Secondary | ICD-10-CM

## 2024-08-27 DIAGNOSIS — J441 Chronic obstructive pulmonary disease with (acute) exacerbation: Secondary | ICD-10-CM

## 2024-08-27 MED ORDER — AMOXICILLIN-POT CLAVULANATE 875-125 MG PO TABS: 1.0000 | ORAL_TABLET | Freq: Two times a day (BID) | ORAL | 0 refills | Status: AC

## 2024-08-27 NOTE — Telephone Encounter (Signed)
 Please advise on further med

## 2024-08-27 NOTE — Telephone Encounter (Signed)
 Called patient. Still notes shortness of breath, wheezing, and yellow sputum. I suggested starting augmentin  for 5 days and to do nebulizer 4 times daily. The patient is in agreement. Prescription sent to pharmacy.

## 2024-08-27 NOTE — Telephone Encounter (Signed)
 FYI Only or Action Required?: Action required by provider: clinical question for provider and medication request.  Patient is followed in Pulmonology for COPD, last seen on 08/20/2024 by Catherine Cools, MD.  Called Nurse Triage reporting Cough.  Symptoms began a week ago.  Interventions attempted: Prescription medications: Z-Pak and prednisone , Maintenance inhaler, Nebulizer treatments, and Home oxygen  use.  Symptoms are: gradually improving.  Triage Disposition: See HCP Within 4 Hours (Or PCP Triage)  Patient/caregiver understands and will follow disposition?: No, refuses disposition                             Copied from CRM [id number]. Topic: Clinical - Red Word Triage >> Aug 27, 2024  8:29 AM Corean SAUNDERS wrote: Red Word that prompted transfer to Nurse Triage: Coughing up yellow sputum, patient saw Dr. Catherine 10/7 and was prescribed prednisone  and antibiotics but is still symptomatic. Reason for Disposition  [1] MILD difficulty breathing (e.g., minimal/no SOB at rest, SOB with walking, pulse < 100) AND [2] still present when not coughing  Answer Assessment - Initial Assessment Questions Patient had an OV on 08/20/24. Patient has finished Z-Pak and is still taking prednisone . Patient stated symptoms have slightly improved, but she is now experiencing SOB upon exertion and she wasn't experiencing that before OV. This RN advised in-person evaluation today. Patient declined and stated she did not want to make the drive today. No virtual availability with primary pulmonologist. Patient would like to ask provider if he can call additional medication in. Please advise.     E2C2 Pulmonary Triage - Initial Assessment Questions  1. Chief Complaint (e.g., cough, sob, wheezing, fever, chills, sweat or additional symptoms) *Go to specific symptom protocol after initial questions. Persistent cough after completing Z-Pak   2. How long have symptoms been present? A  week   3. Have you used any OTC meds to help with symptoms? If yes, ask What medications? Denies   4. Have you used your inhalers/maintenance medication?If yes, What medications? Trelegy 1 x day, nebulizer treatment 2-3 x day, has not been using rescue inhaler    5. Do you wear supplemental oxygen ? If yes, How many liters are you supposed to use? States she is having to use oxygen  and typically does not have to wear it   6. Do you monitor your oxygen  levels? If yes, What is your reading (oxygen  level) today? 93% while on phone with this RN   7. What is your usual oxygen  saturation reading?  (Note: Pulmonary O2 sats should be 90% or greater) 97-98%    1. ONSET: When did the cough begin?      A week ago 2. SEVERITY: How bad is the cough today?      Moderate to severe, frequent coughing spells and wheezing 3. SPUTUM: Describe the color of your sputum (e.g., none, dry cough; clear, white, yellow, green)     Yellow 4. HEMOPTYSIS: Are you coughing up any blood? If Yes, ask: How much? (e.g., flecks, streaks, tablespoons, etc.)     Denies 5. DIFFICULTY BREATHING: Are you having difficulty breathing? If Yes, ask: How bad is it? (e.g., mild, moderate, severe)      Mild SOB upon exertion, states is SOB relieved when she uses oxygen   6. FEVER: Do you have a fever? If Yes, ask: What is your temperature, how was it measured, and when did it start?     Denies 8. LUNG HISTORY: Do you have  any history of lung disease?  (e.g., pulmonary embolus, asthma, emphysema)     COPD 10. OTHER SYMPTOMS: Do you have any other symptoms? (e.g., runny nose, wheezing, chest pain)     Wheezing, chest soreness due to cough, fatigue  Protocols used: Cough - Acute Productive-A-AH

## 2024-09-16 ENCOUNTER — Other Ambulatory Visit: Payer: Self-pay | Admitting: Gastroenterology

## 2024-09-16 DIAGNOSIS — K5904 Chronic idiopathic constipation: Secondary | ICD-10-CM

## 2024-10-31 ENCOUNTER — Ambulatory Visit: Admitting: Gastroenterology

## 2024-11-11 ENCOUNTER — Other Ambulatory Visit: Payer: Self-pay | Admitting: Gastroenterology

## 2024-11-11 DIAGNOSIS — K5904 Chronic idiopathic constipation: Secondary | ICD-10-CM

## 2024-11-21 ENCOUNTER — Ambulatory Visit
Admission: EM | Admit: 2024-11-21 | Discharge: 2024-11-21 | Disposition: A | Discharge status: Home / Self Care | Attending: Nurse Practitioner | Admitting: Nurse Practitioner

## 2024-11-21 DIAGNOSIS — L03113 Cellulitis of right upper limb: Secondary | ICD-10-CM | POA: Diagnosis not present

## 2024-11-21 DIAGNOSIS — L259 Unspecified contact dermatitis, unspecified cause: Secondary | ICD-10-CM

## 2024-11-21 MED ORDER — AMOXICILLIN-POT CLAVULANATE 875-125 MG PO TABS: 1.0000 | ORAL_TABLET | Freq: Two times a day (BID) | ORAL | 0 refills | Status: AC

## 2024-11-21 MED ORDER — TRIAMCINOLONE ACETONIDE 0.025 % EX OINT: 1.0000 | TOPICAL_OINTMENT | Freq: Two times a day (BID) | CUTANEOUS | 0 refills | Status: AC

## 2024-11-21 NOTE — ED Provider Notes (Signed)
 " [facility]    CSN: [id number] Arrival date & time: 11/21/24  1359      History   Chief Complaint No chief complaint on file.   HPI Brittany Miranda is a 74 y.o. female.   Patient presents today with 1 week history of swelling and redness to right hand.  Also reports dry, cracked skin to the backs of both hands.  Reports she has a small cut on the middle fingertip from cutting vegetables and was letting her dog lick it at night time before bed.  No drainage from the wound.  Reports her fingers feel swollen and she is concerned for infection.  She has been taking Prednisone  which has helped with the swelling.  Has been applying Cera Ve to the dry skin without much improvement.  No fever or nausea/vomiting.    Past Medical History:  Diagnosis Date   Anxiety    Colon polyps    adenomas in remote past    Complication of anesthesia    COPD (chronic obstructive pulmonary disease) (HCC)    Depression    HTN (hypertension)    Osteopenia     Patient Active Problem List   Diagnosis Date Noted   Tobacco abuse 03/30/2023   Constipation 03/02/2022   Hypertension 10/25/2021   History of colonic polyps 10/25/2021   Anxiety 08/24/2018   Chronic respiratory failure with hypoxia (HCC) 08/24/2018   COPD with chronic bronchitis and emphysema (HCC) 08/23/2018   Depression with anxiety 08/23/2018   Colon polyps 04/05/2011    Past Surgical History:  Procedure Laterality Date   APPENDECTOMY     COLONOSCOPY  11/2005   Dr. Mardelle diverticula   COLONOSCOPY  2000   [facility]-->per patient colon polyps, adenomatous   COLONOSCOPY N/A 07/08/2016   Surgeon: Lamar CHRISTELLA Hollingshead, MD; colonic diverticulosis, one 5 mm tubular adenoma, repeat in 5 years.   COLONOSCOPY WITH PROPOFOL  N/A 07/13/2022   Surgeon: Hollingshead Lamar CHRISTELLA, MD; Pancolonic diverticulosis, melanosis coli, mildly redundant colon.  Recommended repeat colonoscopy in 7 years for surveillance if overall health permits.    IRRIGATION AND DEBRIDEMENT OF WOUND WITH SPLIT THICKNESS SKIN GRAFT Left 08/13/2018   Procedure: IRRIGATION AND DEBRIDEMENT OF LEFT LEG WOUND WITH SPLIT THICKNESS SKIN GRAFT;  Surgeon: Marcus Lung, MD;  Location: [facility];  Service: Plastics;  Laterality: Left;   SKIN SPLIT GRAFT Left 08/13/2018   Procedure: SKIN GRAFT SPLIT THICKNESS;  Surgeon: Marcus Lung, MD;  Location: [facility];  Service: Plastics;  Laterality: Left;    OB History   No obstetric history on file.      Home Medications    Prior to Admission medications  Medication Sig Start Date End Date Taking? Authorizing Provider  amoxicillin -clavulanate (AUGMENTIN ) 875-125 MG tablet Take 1 tablet by mouth 2 (two) times daily for 7 days. 11/21/24 11/28/24 Yes Chandra Raisin A, NP  triamcinolone  (KENALOG ) 0.025 % ointment Apply 1 Application topically 2 (two) times daily. 11/21/24  Yes Chandra Raisin LABOR, NP  albuterol  (ACCUNEB ) 1.25 MG/3ML nebulizer solution Take 3 mLs (1.25 mg total) by nebulization every 6 (six) hours as needed for wheezing or shortness of breath. 08/20/24   Alghanim, Paula, MD  albuterol  (PROVENTIL  HFA;VENTOLIN  HFA) 108 (90 Base) MCG/ACT inhaler Inhale 1 puff into the lungs every 6 (six) hours as needed for wheezing or shortness of breath. 05/08/18   [provider]  augmented betamethasone dipropionate (DIPROLENE-AF) 0.05 % cream Apply 1 application  topically 2 (two)  times daily as needed (eczema). 06/17/21   [provider]  azithromycin  (ZITHROMAX ) 250 MG tablet a dose of 500 mg (2 tablets of 250 mg) the first day and 250 mg for the remaining four days 08/20/24   Alghanim, Paula, MD  Calcium  Carbonate-Vitamin D (CALTRATE 600+D PO) Take 600 mg by mouth daily.    [provider]  DULoxetine (CYMBALTA) 60 MG capsule Take 60 mg by mouth daily. 07/15/21   [provider]  ibuprofen  (ADVIL ) 200 MG tablet Take 400 mg by mouth every 6 (six) hours as  needed for moderate pain.    [provider]  linaclotide  (LINZESS ) 145 MCG CAPS capsule TAKE ONE CAPSULE ( TOTAL) BY MOUTHDAILY BEFORE BREAKFAST 09/19/24   Shirlean Therisa ORN, NP  losartan (COZAAR) 25 MG tablet Take 25 mg by mouth at bedtime. 01/29/22   [provider]  Multiple Vitamin (MULTIVITAMIN WITH MINERALS) TABS tablet Take 1 tablet by mouth daily.    [provider]  mupirocin  ointment (BACTROBAN ) 2 % Apply 1 Application topically 2 (two) times daily. Patient not taking: Reported on 08/20/2024 08/18/23   Stuart Vernell Norris, PA-C  naltrexone (DEPADE) 50 MG tablet Take 50 mg by mouth at bedtime. Patient not taking: Reported on 08/20/2024 02/15/22   [provider]  Omega-3 Fatty Acids (FISH OIL) 1000 MG CAPS Take 2,400 mg by mouth daily.    [provider]  predniSONE  (DELTASONE ) 10 MG tablet Take 4 tablets (40 mg total) by mouth daily with breakfast for 4 days, THEN 3 tablets (30 mg total) daily with breakfast for 4 days, THEN 2 tablets (20 mg total) daily with breakfast for 4 days, THEN 1 tablet (10 mg total) daily with breakfast for 4 days. 08/20/24   Alghanim, Fahid, MD  traZODone (DESYREL) 50 MG tablet Take 50 mg by mouth at bedtime. 02/15/22   [provider]  TRELEGY ELLIPTA  100-62.5-25 MCG/ACT AEPB Inhale 1 Inhalation into the lungs daily. 08/20/24   Catherine Paula, MD    Family History Family History  Problem Relation Age of Onset   Breast cancer Mother 110   Kidney disease Mother    Kidney disease Father    Liver disease Neg Hx    Colon cancer Neg Hx     Social History Social History[1]   Allergies   Patient has no known allergies.   Review of Systems Review of Systems Per HPI  Physical Exam Triage Vital Signs ED Triage Vitals  Encounter Vitals Group     BP 11/21/24 1416 129/79     Girls Systolic BP Percentile --      Girls Diastolic BP Percentile --      Boys Systolic BP Percentile --      Boys Diastolic BP  Percentile --      Pulse Rate 11/21/24 1416 86     Resp 11/21/24 1416 16     Temp 11/21/24 1416 97.6 F (36.4 C)     Temp Source 11/21/24 1416 Oral     SpO2 11/21/24 1416 93 %     Weight --      Height --      Head Circumference --      Peak Flow --      Pain Score 11/21/24 1419 2     Pain Loc --      Pain Education --      Exclude from Growth Chart --    No data found.  Updated Vital Signs BP 129/79 (BP  Location: Right Arm)   Pulse 86   Temp 97.6 F (36.4 C) (Oral)   Resp 16   SpO2 93%   Visual Acuity Right Eye Distance:   Left Eye Distance:   Bilateral Distance:    Right Eye Near:   Left Eye Near:    Bilateral Near:     Physical Exam Vitals and nursing note reviewed.  Constitutional:      General: She is not in acute distress.    Appearance: Normal appearance. She is not toxic-appearing.  HENT:     Head: Normocephalic and atraumatic.     Right Ear: External ear normal.     Left Ear: External ear normal.     Mouth/Throat:     Mouth: Mucous membranes are moist.     Pharynx: Oropharynx is clear.  Pulmonary:     Effort: Pulmonary effort is normal. No respiratory distress.  Skin:    Comments: Scabbed over wound distal fingertip right middle finger.  There is surrounding erythema. Normal capillary refill.  It is tender to touch.    Dry, cracked skin noted to dorsal aspects of both hands along phalanges.  No significant erythema.  No active drainage, oozing, bleeding.   Neurological:     Mental Status: She is alert and oriented to person, place, and time.  Psychiatric:        Behavior: Behavior is cooperative.      UC Treatments / Results  Labs (all labs ordered are listed, but only abnormal results are displayed) Labs Reviewed - No data to display  EKG   Radiology No results found.  Procedures Procedures (including critical care time)  Medications Ordered in UC Medications - No data to display  Initial Impression / Assessment and Plan / UC  Course  I have reviewed the triage vital signs and the nursing notes.  Pertinent labs & imaging results that were available during my care of the patient were reviewed by me and considered in my medical decision making (see chart for details).   Patient is a very pleasant, well-appearing 74 year old female presenting today for concern for infection in her hands.  Vital signs are stable in triage.  On exam, she has a scabbed over wound to the distal right middle finger with surrounding erythema and tenderness.  Concern for cellulitis.  Treat with Augmentin  twice daily for 7 days given dog has been licking the area.  Regarding the dry/cracked skin on the backs of her hands, suspect contact dermatitis-continue frequent free cream, start steroid ointment low-dose, and Vaseline.  ER and return precautions discussed with patient.  The patient was given the opportunity to ask questions.  All questions answered to their satisfaction.  The patient is in agreement to this plan.   Final Clinical Impressions(s) / UC Diagnoses   Final diagnoses:  Cellulitis of right hand  Contact dermatitis of right hand     Discharge Instructions      Take the antibiotic (Augmentin ) twice daily for 7 days as prescribed to treat for infection in your skin.  For the dry skin, continue applying Cera Ve twice daily, then cover with Kenalog  ointment and Vaseline.  Seek care if symptoms worsen or do not improve with treatment.    ED Prescriptions     Medication Sig Dispense Auth. Provider   amoxicillin -clavulanate (AUGMENTIN ) 875-125 MG tablet Take 1 tablet by mouth 2 (two) times daily for 7 days. 14 tablet Chandra Raisin A, NP   triamcinolone  (KENALOG ) 0.025 % ointment Apply 1  Application topically 2 (two) times daily. 15 g Chandra Harlene LABOR, NP      PDMP not reviewed this encounter.     [1]  Social History Tobacco Use   Smoking status: Former    Current packs/day: 0.00    Average packs/day: 1 pack/day  for 30.0 years (30.0 ttl pk-yrs)    Types: Cigarettes    Start date: 04/04/1968    Quit date: 04/04/1998    Years since quitting: 26.6   Smokeless tobacco: Never  Vaping Use   Vaping status: Never Used  Substance Use Topics   Alcohol use: Not Currently    Comment: Abstinent since March 2023.  Reports she was drinking too much and went to rehab.   Drug use: No     Chandra Harlene LABOR, NP 11/21/24 1644  "

## 2024-11-21 NOTE — ED Triage Notes (Signed)
 Pt reports pain redness and swelling in the hands, x 1 week. Has been using OTC ointments and lotions has found no relief.

## 2024-11-21 NOTE — Discharge Instructions (Addendum)
 Take the antibiotic (Augmentin ) twice daily for 7 days as prescribed to treat for infection in your skin.  For the dry skin, continue applying Cera Ve twice daily, then cover with Kenalog  ointment and Vaseline.  Seek care if symptoms worsen or do not improve with treatment.

## 2024-11-25 ENCOUNTER — Ambulatory Visit (HOSPITAL_BASED_OUTPATIENT_CLINIC_OR_DEPARTMENT_OTHER): Admitting: Pulmonary Disease

## 2024-12-05 ENCOUNTER — Ambulatory Visit (HOSPITAL_BASED_OUTPATIENT_CLINIC_OR_DEPARTMENT_OTHER): Admitting: Pulmonary Disease

## 2024-12-05 ENCOUNTER — Encounter (HOSPITAL_BASED_OUTPATIENT_CLINIC_OR_DEPARTMENT_OTHER): Payer: Self-pay | Admitting: Pulmonary Disease

## 2024-12-05 VITALS — BP 119/74 | HR 75 | Ht 63.0 in | Wt 88.0 lb

## 2024-12-05 DIAGNOSIS — J439 Emphysema, unspecified: Secondary | ICD-10-CM

## 2024-12-05 DIAGNOSIS — J449 Chronic obstructive pulmonary disease, unspecified: Secondary | ICD-10-CM | POA: Diagnosis not present

## 2024-12-05 NOTE — Progress Notes (Signed)
" ° °  Subjective:    Patient ID: Brittany Miranda, female    DOB: [birth year], 74 y.o.   MRN: [id number]  74 yo retired [employer] for follow-up of COPD. She quit smoking 1999, more than 30 pack years 04/2022 She needed nocturnal oxygen  transiently after hospital visit for an exacerbation    -completed alcohol rehab program and  weaned herself off Xanax    Discussed the use of AI scribe software for clinical note transcription with the patient, who gave verbal consent to proceed.  History of Present Illness Brittany Miranda is a 74 year old female with COPD who presents for follow-up.  Her last COPD exacerbation was in October and was treated with a corticosteroid injection and antibiotics, with no further flare-ups since. She has not needed albuterol  recently and is using only Trelegy. Her exercise tolerance is good; she can walk and jog with her dog without limitation. A chest x-ray in 2024 showed emphysema. She quit smoking in 1999. Her breathing has been very good without recent dyspnea or wheeze.    08/2024 last flare  Significant tests/ events reviewed   03/2021 >On ambulation heart rate increased from 80-1 03 and oxygen  saturation dropped from 96 to 91% on 2 laps   PFTs 03/2021 ratio 44, FEV1 49%, DLCO 30%, severe airway obstruction  Review of Systems  neg for any significant sore throat, dysphagia, itching, sneezing, nasal congestion or excess/ purulent secretions, fever, chills, sweats, unintended wt loss, pleuritic or exertional cp, hempoptysis, orthopnea pnd or change in chronic leg swelling. Also denies presyncope, palpitations, heartburn, abdominal pain, nausea, vomiting, diarrhea or change in bowel or urinary habits, dysuria,hematuria, rash, arthralgias, visual complaints, headache, numbness weakness or ataxia.      Objective:   Physical Exam  Gen. Pleasant, well-nourished, in no distress ENT - no thrush, no pallor/icterus,no post nasal drip Neck: No JVD, no thyromegaly, no carotid  bruits Lungs: no use of accessory muscles, no dullness to percussion, decreased BL without rales or rhonchi  Cardiovascular: Rhythm regular, heart sounds  normal, no murmurs or gallops, no peripheral edema Musculoskeletal: No deformities, no cyanosis or clubbing        Assessment & Plan:   Assessment and Plan Assessment & Plan Chronic obstructive pulmonary disease COPD is well-managed with no exacerbations since October. She reports good exercise tolerance and no recent use of albuterol . Trelegy is effective in managing symptoms. She does not qualify for lung cancer screening due to smoking cessation in 1999. Last chest x-ray in 2024 showed emphysema without concerning findings. - Continue Trelegy as prescribed. - Provided refills for Trelegy as needed. - Advised to contact the office early if symptoms worsen, especially during winter, to avoid hospitalization. - Scheduled follow-up in one year.     "

## 2024-12-12 ENCOUNTER — Ambulatory Visit: Admitting: Gastroenterology

## 2024-12-12 ENCOUNTER — Encounter: Payer: Self-pay | Admitting: Gastroenterology

## 2024-12-12 DIAGNOSIS — K5904 Chronic idiopathic constipation: Secondary | ICD-10-CM | POA: Diagnosis not present

## 2024-12-12 MED ORDER — LINACLOTIDE 145 MCG PO CAPS: 145.0000 ug | ORAL_CAPSULE | Freq: Every day | ORAL | 3 refills | Status: AC

## 2024-12-12 NOTE — Progress Notes (Signed)
 "  GI Office Note    Referring Provider: Leonce Lucie JINNY DEVONNA Primary Care Physician:  Leonce Lucie JINNY DEVONNA Primary Gastroenterologist: Lamar HERO.Rourk, MD  Date:  12/12/2024  ID:  Brittany Miranda, DOB Jun 09, 1951, MRN [id number]   Chief Complaint   Chief Complaint  Patient presents with   Follow-up    Follow up. No problems    History of Present Illness  Brittany Miranda is a 74 y.o. female with a history of COPD, anxiety/depression, alcohol abuse sober since 2023, HTN, adenomatous colon polyps, and chronic constipation presenting today for follow-up of constipation.  Colonoscopy August 2023: - Pancolonic diverticulosis - Melanosis coli - Mildly redundant colon - Recommend repeat in 7 years.  Last office visit January 2024 virtually with Josette Centers, PA-C.  Doing well overall, constipation controlled with Linzess  145 mcg daily.  Denied any upper GI symptoms or abdominal pain, BRBPR, melena, or weight loss.Brittany Miranda  She was advised to continue Linzess  145 mcg daily and follow-up in 1 year, sooner if needed.  Today: Discussed the use of AI scribe software for clinical note transcription with the patient, who gave verbal consent to proceed.  Chronic constipation is managed with daily Linzess , which continues to provide effective symptom control. Bowel movements occur every other day and are described as satisfactory. No difficulty with defecation or current constipation. No abdominal pain associated with bowel movements. Stools are typically normal and semi-formed, with occasional looser but not watery stools. Stool volume is substantial. She denies blood in the stool, black stool, nausea, vomiting, or trouble swallowing.  Appetite is good and weight is stable. Diet consists of three meals daily, avoiding eating to fullness, with occasional snacks. Typical meals include sandwiches for lunch, pizza with added vegetables for dinner, and half a peanut butter and raisin sandwich on whole  wheat bread for breakfast, along with orange juice and coffee.  Within the past few weeks, she developed cellulitis of the hands, which was treated with prednisone  and antibiotics. She experiences cold sensitivity and wears multiple layers of clothing, sometimes up to five layers.       Wt Readings from Last 6 Encounters:  12/12/24 88 lb (39.9 kg)  12/05/24 88 lb (39.9 kg)  08/20/24 89 lb 1.6 oz (40.4 kg)  10/23/23 90 lb 11.2 oz (41.1 kg)  03/30/23 92 lb 9.6 oz (42 kg)  12/14/22 93 lb (42.2 kg)   Body mass index is 16.1 kg/m.  Current Outpatient Medications  Medication Sig Dispense Refill   albuterol  (ACCUNEB ) 1.25 MG/3ML nebulizer solution Take 3 mLs (1.25 mg total) by nebulization every 6 (six) hours as needed for wheezing or shortness of breath. 75 mL 12   albuterol  (PROVENTIL  HFA;VENTOLIN  HFA) 108 (90 Base) MCG/ACT inhaler Inhale 1 puff into the lungs every 6 (six) hours as needed for wheezing or shortness of breath.     augmented betamethasone dipropionate (DIPROLENE-AF) 0.05 % cream Apply 1 application  topically 2 (two) times daily as needed (eczema).     Calcium  Carbonate-Vitamin D (CALTRATE 600+D PO) Take 600 mg by mouth daily.     DULoxetine (CYMBALTA) 60 MG capsule Take 60 mg by mouth daily.     ibuprofen  (ADVIL ) 200 MG tablet Take 400 mg by mouth every 6 (six) hours as needed for moderate pain.     LINZESS  145 MCG CAPS capsule TAKE ONE CAPSULE ( TOTAL) BY MOUTHDAILY BEFORE BREAKFAST 90 capsule 0   losartan (COZAAR) 25 MG tablet Take 25 mg by mouth at bedtime.  Multiple Vitamin (MULTIVITAMIN WITH MINERALS) TABS tablet Take 1 tablet by mouth daily.     mupirocin  ointment (BACTROBAN ) 2 % Apply 1 Application topically 2 (two) times daily. 22 g 0   Omega-3 Fatty Acids (FISH OIL) 1000 MG CAPS Take 2,400 mg by mouth daily.     traZODone (DESYREL) 50 MG tablet Take 50 mg by mouth at bedtime.     TRELEGY ELLIPTA  100-62.5-25 MCG/ACT AEPB Inhale 1 Inhalation into the lungs  daily. 60 each 9   triamcinolone  (KENALOG ) 0.025 % ointment Apply 1 Application topically 2 (two) times daily. 15 g 0   azithromycin  (ZITHROMAX ) 250 MG tablet a dose of 500 mg (2 tablets of 250 mg) the first day and 250 mg for the remaining four days (Patient not taking: Reported on 12/12/2024) 6 tablet 0   naltrexone (DEPADE) 50 MG tablet Take 50 mg by mouth at bedtime. (Patient not taking: Reported on 12/12/2024)     predniSONE  (DELTASONE ) 10 MG tablet Take 4 tablets (40 mg total) by mouth daily with breakfast for 4 days, THEN 3 tablets (30 mg total) daily with breakfast for 4 days, THEN 2 tablets (20 mg total) daily with breakfast for 4 days, THEN 1 tablet (10 mg total) daily with breakfast for 4 days. (Patient not taking: Reported on 12/12/2024) 40 tablet 0   No current facility-administered medications for this visit.    Past Medical History:  Diagnosis Date   Anxiety    Colon polyps    adenomas in remote past    Complication of anesthesia    COPD (chronic obstructive pulmonary disease) (HCC)    Depression    HTN (hypertension)    Osteopenia     Past Surgical History:  Procedure Laterality Date   APPENDECTOMY     COLONOSCOPY  11/2005   Dr. Mardelle diverticula   COLONOSCOPY  2000   [facility]-->per patient colon polyps, adenomatous   COLONOSCOPY N/A 07/08/2016   Surgeon: Lamar CHRISTELLA Hollingshead, MD; colonic diverticulosis, one 5 mm tubular adenoma, repeat in 5 years.   COLONOSCOPY WITH PROPOFOL  N/A 07/13/2022   Surgeon: Hollingshead Lamar CHRISTELLA, MD; Pancolonic diverticulosis, melanosis coli, mildly redundant colon.  Recommended repeat colonoscopy in 7 years for surveillance if overall health permits.   IRRIGATION AND DEBRIDEMENT OF WOUND WITH SPLIT THICKNESS SKIN GRAFT Left 08/13/2018   Procedure: IRRIGATION AND DEBRIDEMENT OF LEFT LEG WOUND WITH SPLIT THICKNESS SKIN GRAFT;  Surgeon: Marcus Lung, MD;  Location: [facility];  Service: Plastics;  Laterality: Left;   SKIN  SPLIT GRAFT Left 08/13/2018   Procedure: SKIN GRAFT SPLIT THICKNESS;  Surgeon: Marcus Lung, MD;  Location: [facility];  Service: Plastics;  Laterality: Left;    Family History  Problem Relation Age of Onset   Breast cancer Mother 74   Kidney disease Mother    Kidney disease Father    Liver disease Neg Hx    Colon cancer Neg Hx     Allergies as of 12/12/2024   (No Known Allergies)    Social History   Socioeconomic History   Marital status: Divorced    Spouse name: Not on file   Number of children: 1   Years of education: Not on file   Highest education level: Not on file  Occupational History   Occupation: retired    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  Tobacco Use   Smoking status: Former    Current packs/day: 0.00  Average packs/day: 1 pack/day for 30.0 years (30.0 ttl pk-yrs)    Types: Cigarettes    Start date: 04/04/1968    Quit date: 04/04/1998    Years since quitting: 26.7   Smokeless tobacco: Never  Vaping Use   Vaping status: Never Used  Substance and Sexual Activity   Alcohol use: Not Currently    Comment: Abstinent since March 2023.  Reports she was drinking too much and went to rehab.   Drug use: No   Sexual activity: Not Currently  Other Topics Concern   Not on file  Social History Narrative   Not on file   Social Drivers of Health   Tobacco Use: Medium Risk (12/12/2024)   Patient History    Smoking Tobacco Use: Former    Smokeless Tobacco Use: Never    Passive Exposure: Not on Actuary Strain: Not on file  Food Insecurity: Not on file  Transportation Needs: Not on file  Physical Activity: Not on file  Stress: Not on file  Social Connections: Not on file  Depression (EYV7-0): Not on file  Alcohol Screen: Not on file  Housing: Not on file  Utilities: Not on file  Health Literacy: Not on file    Review of Systems   Gen: Denies fever, chills, anorexia. Denies fatigue,  weakness, weight loss.  CV: Denies chest pain, palpitations, syncope, peripheral edema, and claudication. Resp: Denies dyspnea at rest, cough, wheezing, coughing up blood, and pleurisy. GI: See HPI Derm: Denies rash, itching, dry skin Psych: Denies depression, anxiety, memory loss, confusion. No homicidal or suicidal ideation.  Heme: Denies bruising, bleeding, and enlarged lymph nodes.  Physical Exam   BP 134/86 (BP Location: Right Arm, Patient Position: Sitting, Cuff Size: Normal)   Pulse 65   Temp 97.6 F (36.4 C) (Temporal)   Ht 5' 2 (1.575 m)   Wt 88 lb (39.9 kg)   BMI 16.10 kg/m   General:   Alert and oriented. No distress noted. Pleasant and cooperative.  Head:  Normocephalic and atraumatic. Eyes:  Conjuctiva clear without scleral icterus. Mouth:  Oral mucosa pink and moist. Good dentition. No lesions. Lungs:  Clear to auscultation bilaterally. No wheezes, rales, or rhonchi. No distress.  Heart:  S1, S2 present without murmurs appreciated.  Abdomen:  +BS, soft, non-tender and non-distended. No rebound or guarding. No HSM or masses noted. Rectal: deferred Msk:  Symmetrical without gross deformities. Normal posture. Extremities:  cyanosis noted to fingertips sporadically on bilateral hands affected (healing cellulitis) Neurologic:  Alert and  oriented x4 Psych:  Alert and cooperative. Normal mood and affect.  Assessment & Plan  Brittany Miranda is a 74 y.o. female presenting today for general follow-up and refill of medication.    Chronic idiopathic constipation Chronic idiopathic constipation is well-controlled on current Linzess  regimen of 145 mcg daily, with no acute or worsening symptoms and no associated gastrointestinal complaints. She is satisfied with her bowel regimen and is aware that future dose adjustment is an option if symptoms recur. - Refilled Linzess  145 mcg daily for 90 days with three refills at her preferred pharmacy. - Discussed option to increase Linzess   dose if symptoms worsen or current dose becomes less effective.     Follow up   Follow up 1 year, sooner if needed.     Charmaine Melia, MSN, FNP-BC, AGACNP-BC [facility] "

## 2024-12-12 NOTE — Patient Instructions (Addendum)
 Continue Linzess  145 mcg daily.  I sent in refill for you today.  If any point you feel like your constipation is getting worse or you are having bowel movements less frequently let me know and we can consider increasing your dose.  Make sure you are getting enough water  intake on a daily basis, generally recommend about 64 ounces of water  daily and ensure you are getting enough fiber in your diet.  Follow-up in 1 year, sooner if needed!  It was a pleasure to meet you today!  I want to create trusting relationships with patients. If you receive a survey regarding your visit,  I greatly appreciate you taking time to fill this out on paper or through your MyChart. I value your feedback.  Charmaine Melia, MSN, FNP-BC, AGACNP-BC [facility]
